# Patient Record
Sex: Male | Born: 1970
Health system: Southern US, Community
[De-identification: ages and names within clinical notes are randomized; demographics above are authoritative.]

## PROBLEM LIST (undated history)

## (undated) DIAGNOSIS — F32A Depression, unspecified: Secondary | ICD-10-CM

## (undated) DIAGNOSIS — M5431 Sciatica, right side: Secondary | ICD-10-CM

## (undated) DIAGNOSIS — F429 Obsessive-compulsive disorder, unspecified: Secondary | ICD-10-CM

## (undated) DIAGNOSIS — F419 Anxiety disorder, unspecified: Secondary | ICD-10-CM

## (undated) DIAGNOSIS — F319 Bipolar disorder, unspecified: Secondary | ICD-10-CM

## (undated) DIAGNOSIS — I10 Essential (primary) hypertension: Secondary | ICD-10-CM

## (undated) DIAGNOSIS — F909 Attention-deficit hyperactivity disorder, unspecified type: Secondary | ICD-10-CM

## (undated) DIAGNOSIS — F329 Major depressive disorder, single episode, unspecified: Secondary | ICD-10-CM

## (undated) DIAGNOSIS — F29 Unspecified psychosis not due to a substance or known physiological condition: Secondary | ICD-10-CM

## (undated) HISTORY — DX: Major depressive disorder, single episode, unspecified: F32.9

## (undated) HISTORY — DX: Obsessive-compulsive disorder, unspecified: F42.9

## (undated) HISTORY — DX: Attention-deficit hyperactivity disorder, unspecified type: F90.9

## (undated) HISTORY — DX: Depression, unspecified: F32.A

## (undated) HISTORY — DX: Essential (primary) hypertension: I10

## (undated) HISTORY — DX: Bipolar disorder, unspecified: F31.9

## (undated) HISTORY — DX: Sciatica, right side: M54.31

## (undated) HISTORY — PX: LEG SURGERY: SHX1003

## (undated) HISTORY — DX: Unspecified psychosis not due to a substance or known physiological condition: F29

## (undated) HISTORY — DX: Anxiety disorder, unspecified: F41.9

---

## 1996-10-13 HISTORY — PX: HAND TENDON SURGERY: SHX663

## 2012-09-20 ENCOUNTER — Telehealth (HOSPITAL_COMMUNITY): Payer: Self-pay | Admitting: Psychiatry

## 2012-09-20 ENCOUNTER — Ambulatory Visit (INDEPENDENT_AMBULATORY_CARE_PROVIDER_SITE_OTHER): Payer: 59 | Admitting: Psychiatry

## 2012-09-20 ENCOUNTER — Encounter (HOSPITAL_COMMUNITY): Payer: Self-pay | Admitting: Psychiatry

## 2012-09-20 VITALS — BP 140/82 | HR 84 | Ht 70.5 in | Wt 216.4 lb

## 2012-09-20 DIAGNOSIS — F5105 Insomnia due to other mental disorder: Secondary | ICD-10-CM | POA: Insufficient documentation

## 2012-09-20 DIAGNOSIS — F410 Panic disorder [episodic paroxysmal anxiety] without agoraphobia: Secondary | ICD-10-CM

## 2012-09-20 DIAGNOSIS — F988 Other specified behavioral and emotional disorders with onset usually occurring in childhood and adolescence: Secondary | ICD-10-CM

## 2012-09-20 DIAGNOSIS — F411 Generalized anxiety disorder: Secondary | ICD-10-CM

## 2012-09-20 DIAGNOSIS — F902 Attention-deficit hyperactivity disorder, combined type: Secondary | ICD-10-CM

## 2012-09-20 DIAGNOSIS — F952 Tourette's disorder: Secondary | ICD-10-CM

## 2012-09-20 DIAGNOSIS — F429 Obsessive-compulsive disorder, unspecified: Secondary | ICD-10-CM

## 2012-09-20 MED ORDER — CLONIDINE HCL ER 0.1 & 0.2 MG PO MISC: 0.1000 mg | Freq: Every day | ORAL | Status: DC

## 2012-09-20 MED ORDER — VENLAFAXINE HCL ER 37.5 MG PO CP24: 37.5000 mg | ORAL_CAPSULE | Freq: Every day | ORAL | Status: DC

## 2012-09-20 MED ORDER — CLONIDINE HCL 0.1 MG PO TABS: 0.1000 mg | ORAL_TABLET | Freq: Every day | ORAL | Status: DC

## 2012-09-20 NOTE — Progress Notes (Signed)
Psychiatric Assessment Adult  Patient Identification:  Todd Gilbert Date of Evaluation:  09/20/2012 Chief Complaint: I need help History of Chief Complaint: Pt first sought help at age 41 and has been in treatment pretty much ever since then.   He has been identified as LD and never graduated from HS.  He kept getting into trouble and getting kicked out of schools.  He has struggled with anxiety and depression and tried on several medications listed below.  He found the best benefit from Adderall.  He comes seeking other meds that don't cost $130 a month.  Since starting Adderall he has not been in trouble with the law.   Chief Complaint  Patient presents with  . ADD  . Depression  . Establish Care  . Medication Refill    HPI see above  Review of Systems Neuro: no headaches, ataxia, weakness GI: no N/V/D/cramps/constipation MS: no weakness, muscle cramps, aches. He notes some joint aches and could use chrondrotin sulfate with glucosamine.  Physical Exam Essentially WNL per his family doctor last week.  Depressive Symptoms: depressed mood, insomnia, difficulty concentrating, suicidal thoughts without plan, anxiety, panic attacks,  (Hypo) Manic Symptoms:   Elevated Mood:  Negative Irritable Mood:  Yes Grandiosity:  history of Distractibility:  Yes Labiality of Mood:  Yes Delusions:  Yes Hallucinations:  Yes Impulsivity:  Yes Sexually Inappropriate Behavior:  No Financial Extravagance:  history of Flight of Ideas:  No  Anxiety Symptoms: Excessive Worry:  Yes Panic Symptoms:  Yes Agoraphobia:  Yes Obsessive Compulsive: Yes  Symptoms: having things in perfect order and exactly right Specific Phobias:  No Social Anxiety:  Yes  Psychotic Symptoms:  Hallucinations: Yes Visual Delusions:  No Paranoia:  Yes   Ideas of Reference:  No  PTSD Symptoms: Ever had a traumatic exposure:  Yes Had a traumatic exposure in the last month:  No Re-experiencing: Yes  Flashbacks Hypervigilance:  No Hyperarousal: No Difficulty Concentrating Avoidance: Yes Decreased Interest/Participation  Traumatic Brain Injury: Yes Blunt Trauma  Past Psychiatric History: Diagnosis: ADHD, Bipolar disorder  Hospitalizations: adult twice  Outpatient Care: since age 38  Substance Abuse Care: adult twice  Self-Mutilation: none  Suicidal Attempts: none  Violent Behaviors: in the past   Past Medical History:   Past Medical History  Diagnosis Date  . ADHD (attention deficit hyperactivity disorder)   . Anxiety   . Bipolar disorder   . Depression   . Obsessive-compulsive disorder   . Psychosis    History of Loss of Consciousness:  Yes Seizure History:  No Cardiac History:  No Allergies:  Not on File Current Medications:  Current Outpatient Prescriptions  Medication Sig Dispense Refill  . amphetamine-dextroamphetamine (ADDERALL) 20 MG tablet Take 20 mg by mouth 3 (three) times daily.      . diazepam (VALIUM) 5 MG tablet Take 5 mg by mouth at bedtime as needed.        Previous Psychotropic Medications:  Medication Dose   Paxil   Vistaril   Zoloft   Lamictal   Depakote   Wellbutrin   Klonopin   Adderall   Valium   Ritalin   Remeron       Substance Abuse History in the last 12 months: Substance Age of 1st Use Last Use Amount Specific Type  Nicotine  13  2 weeks  dip    Alcohol  14  yesterday  two 12 oz  beer  Cannabis  14  years      Opiates  none  Cocaine  18  31      Methamphetamines  none        LSD  17  18      Ecstasy  none         Benzodiazepines  30's  last night  5mg   Valium  Caffeine  childhood  today  swallow  soft drink  Inhalants  none        Others:      Sugar  childhood  today                  Medical Consequences of Substance Abuse: none  Legal Consequences of Substance Abuse: none  Family Consequences of Substance Abuse: none  Blackouts:  No DT's:  No Withdrawal Symptoms:  No   Social History: Current Place  of Residence: [address] [place] [address] Place of Birth: [place] Family Members: wife step son Marital Status:  Married Children: 3  Sons: 2  Daughters: 1 Relationships: wife Education:  dropped ou in 11 th grade Educational Problems/Performance: LD and ADHD Religious Beliefs/Practices: none History of Abuse: none Occupational Experiences; none Military History:  Writer History: none Hobbies/Interests: fishing, watching sports, walking outside  Family History:   Family History  Problem Relation Age of Onset  . Alcohol abuse Father   . Anxiety disorder Father   . Dementia Maternal Grandmother   . OCD Neg Hx     Mental Status Examination/Evaluation: Objective:  Appearance: Casual  Eye Contact::  Good  Speech:  Clear and Coherent  Volume:  Normal  Mood:  Anxious and slightly depressed and distracted  Affect:  Congruent  Thought Process:  Coherent, Intact and Logical  Orientation:  Full (Time, Place, and Person)  Thought Content:  Hallucinations: Visual  Suicidal Thoughts:  No  Homicidal Thoughts:  No  Judgement:  Fair  Insight:  Fair  Psychomotor Activity:  Increased and Restlessness  Akathisia:  No  Handed:  Right  AIMS (if indicated):    Assets:  Communication Skills Desire for Improvement    Laboratory/X-Ray Psychological Evaluation(s)     Continuous performance testing   Assessment:    AXIS I ADHD, inattentive type, Generalized Anxiety Disorder, Obsessive Compulsive Disorder, Panic Disorder and Tourette Syndrome  AXIS II Deferred  AXIS III Past Medical History  Diagnosis Date  . ADHD (attention deficit hyperactivity disorder)   . Anxiety   . Bipolar disorder   . Depression   . Obsessive-compulsive disorder   . Psychosis      AXIS IV other psychosocial or environmental problems  AXIS V 61-70 mild symptoms   Treatment Plan/Recommendations:  Plan of Care: CPT adjusted medications trials  Laboratory:    Psychotherapy: problem  solving  Medications: Effexor, Kapvay, and Clonidine  Routine PRN Medications:  Negative  Consultations:   Safety Concerns:  Very slight with his history of violence, but much improved  Other:     Orson Aloe, MD 12/9/20139:39 AM

## 2012-09-20 NOTE — Patient Instructions (Addendum)
Could use "Move Free" or "Osteo bi Flex" for arthritic pain.   The important ingredients are Chondrotin Sulfate and Glucosamine.  Look up Tourette Syndrome on the Internet to see if that applies to you.

## 2012-11-01 ENCOUNTER — Ambulatory Visit (INDEPENDENT_AMBULATORY_CARE_PROVIDER_SITE_OTHER): Payer: 59 | Admitting: Psychiatry

## 2012-11-01 ENCOUNTER — Encounter (HOSPITAL_COMMUNITY): Payer: Self-pay | Admitting: Psychiatry

## 2012-11-01 ENCOUNTER — Telehealth (HOSPITAL_COMMUNITY): Payer: Self-pay | Admitting: Psychiatry

## 2012-11-01 VITALS — Wt 219.0 lb

## 2012-11-01 DIAGNOSIS — F410 Panic disorder [episodic paroxysmal anxiety] without agoraphobia: Secondary | ICD-10-CM

## 2012-11-01 DIAGNOSIS — F429 Obsessive-compulsive disorder, unspecified: Secondary | ICD-10-CM

## 2012-11-01 DIAGNOSIS — F419 Anxiety disorder, unspecified: Secondary | ICD-10-CM | POA: Insufficient documentation

## 2012-11-01 DIAGNOSIS — F411 Generalized anxiety disorder: Secondary | ICD-10-CM

## 2012-11-01 DIAGNOSIS — F952 Tourette's disorder: Secondary | ICD-10-CM

## 2012-11-01 DIAGNOSIS — F988 Other specified behavioral and emotional disorders with onset usually occurring in childhood and adolescence: Secondary | ICD-10-CM

## 2012-11-01 DIAGNOSIS — F902 Attention-deficit hyperactivity disorder, combined type: Secondary | ICD-10-CM

## 2012-11-01 DIAGNOSIS — F5105 Insomnia due to other mental disorder: Secondary | ICD-10-CM

## 2012-11-01 MED ORDER — CARBAMAZEPINE ER 200 MG PO TB12: 200.0000 mg | ORAL_TABLET | Freq: Every day | ORAL | Status: DC

## 2012-11-01 MED ORDER — AMPHETAMINE-DEXTROAMPHETAMINE 20 MG PO TABS: 20.0000 mg | ORAL_TABLET | Freq: Every day | ORAL | Status: DC

## 2012-11-01 MED ORDER — AMPHETAMINE-DEXTROAMPHETAMINE 20 MG PO TABS: 20.0000 mg | ORAL_TABLET | Freq: Three times a day (TID) | ORAL | Status: DC

## 2012-11-01 NOTE — Progress Notes (Signed)
Psychiatric Assessment Adult [facility] [address] Progress Note Cervando Durnin MRN: [id number] DOB: [birth year] Age: 42 y.o.  Date: 11/01/2012 Start Time: 9:55 AM End Time: 10:30 AM  Chief Complaint: Chief Complaint  Patient presents with  . ADHD  . Follow-up  . Medication Refill   History of Chief Complaint: Pt first sought help at age 72 and has been in treatment pretty much ever since then.   He has been identified as LD and never graduated from HS.  He kept getting into trouble and getting kicked out of schools.  He has struggled with anxiety and depression and tried on several medications listed below.  He found the best benefit from Adderall.  He comes seeking other meds that don't cost $130 a month.  Since starting Adderall he has not been in trouble with the law.  Depression 7/10 and Anxiety 7/10, where 1 is the best and 10 is the worst.  Pt reports that he is cnonompliant with the psychotropic medications because there was no benefit what so ever.  He was a zombie and wouldn't want to talk or go to work.  When he was on the Adderall, he was able to go to work and function normally.  He has not been in jail for the 4 years he was on it.  He is cleared of any debt to society and has paid everything off.   HPI see above  Review of Systems Neuro: no headaches, ataxia, weakness GI: no N/V/D/cramps/constipation MS: no weakness, muscle cramps, aches. He notes some joint aches and could use chrondrotin sulfate with glucosamine.  Physical Exam Essentially WNL per his family doctor last week.  Depressive Symptoms: depressed mood, insomnia, difficulty concentrating, suicidal thoughts without plan, anxiety, panic attacks,  (Hypo) Manic Symptoms:   Elevated Mood:  Negative Irritable Mood:  Yes Grandiosity:  history of Distractibility:  Yes Labiality of Mood:  Yes Delusions:  Yes Hallucinations:  Yes Impulsivity:  Yes Sexually Inappropriate Behavior:  No Financial  Extravagance:  history of Flight of Ideas:  No  Anxiety Symptoms: Excessive Worry:  Yes Panic Symptoms:  Yes Agoraphobia:  Yes Obsessive Compulsive: Yes  Symptoms: having things in perfect order and exactly right Specific Phobias:  No Social Anxiety:  Yes  Psychotic Symptoms:  Hallucinations: Yes Visual Delusions:  No Paranoia:  Yes   Ideas of Reference:  No  PTSD Symptoms: Ever had a traumatic exposure:  Yes Had a traumatic exposure in the last month:  No Re-experiencing: Yes Flashbacks Hypervigilance:  No Hyperarousal: No Difficulty Concentrating Avoidance: Yes Decreased Interest/Participation  Traumatic Brain Injury: Yes Blunt Trauma  Past Psychiatric History: Diagnosis: ADHD, Bipolar disorder  Hospitalizations: adult twice  Outpatient Care: since age 79  Substance Abuse Care: adult twice  Self-Mutilation: none  Suicidal Attempts: none  Violent Behaviors: in the past   Past Medical History:   Past Medical History  Diagnosis Date  . ADHD (attention deficit hyperactivity disorder)   . Anxiety   . Bipolar disorder   . Depression   . Obsessive-compulsive disorder   . Psychosis    History of Loss of Consciousness:  Yes Seizure History:  No Cardiac History:  No Allergies:  Not on File Current Medications:  Current Outpatient Prescriptions  Medication Sig Dispense Refill  . amphetamine-dextroamphetamine (ADDERALL) 20 MG tablet Take 20 mg by mouth 3 (three) times daily.      . cloNIDine (CATAPRES) 0.1 MG tablet Take 1 tablet (0.1 mg total) by mouth at bedtime. May take up  to 3 for sleep, may cause you to wake up after 4 hours.  60 tablet  1  . CloNIDine HCl ER (KAPVAY) 0.1 & 0.2 MG MISC Take 0.1 mg by mouth daily. BEFORE WORK FOR HYPERNESS  60 each  1  . diazepam (VALIUM) 5 MG tablet Take 5 mg by mouth at bedtime as needed.      . venlafaxine XR (EFFEXOR XR) 37.5 MG 24 hr capsule Take 1 capsule (37.5 mg total) by mouth daily.  30 capsule  2    Previous  Psychotropic Medications:  Medication Dose   Paxil   Vistaril   Zoloft   Lamictal   Depakote   Wellbutrin   Klonopin   Adderall   Valium   Ritalin   Remeron   Clonidine/Kapvay   Effexor    Substance Abuse History in the last 12 months: Substance Age of 1st Use Last Use Amount Specific Type  Nicotine  13  2 weeks  dip    Alcohol  14  yesterday  two 12 oz  beer  Cannabis  14  years      Opiates  none        Cocaine  [age]      Methamphetamines  none        LSD  17  18      Ecstasy  none         Benzodiazepines  30's  last night  5mg   Valium  Caffeine  childhood  today  swallow  soft drink  Inhalants  none        Others:      Sugar  childhood  today                  Medical Consequences of Substance Abuse: none  Legal Consequences of Substance Abuse: none  Family Consequences of Substance Abuse: none  Blackouts:  No DT's:  No Withdrawal Symptoms:  No   Social History: Current Place of Residence: [address] [place] [address] Place of Birth: [place] Family Members: wife step son Marital Status:  Married Children: 3  Sons: 2  Daughters: 1 Relationships: wife Education:  dropped out in 11 th grade Educational Problems/Performance: LD and ADHD Religious Beliefs/Practices: none History of Abuse: none Occupational Experiences; none Military History:  Writer History: none Hobbies/Interests: fishing, watching sports, walking outside  Family History:   Family History  Problem Relation Age of Onset  . Alcohol abuse Father   . Anxiety disorder Father   . Dementia Maternal Grandmother   . OCD Neg Hx   . ADD / ADHD Neg Hx   . Bipolar disorder Neg Hx   . Depression Neg Hx   . Drug abuse Neg Hx   . Paranoid behavior Neg Hx   . Schizophrenia Neg Hx   . Seizures Neg Hx   . Sexual abuse Neg Hx   . Physical abuse Neg Hx     Mental Status Examination/Evaluation: Objective:  Appearance: Casual  Eye Contact::  Good  Speech:  Clear and  Coherent  Volume:  Normal  Mood:  Anxious and slightly depressed and distracted  Affect:  Congruent  Thought Process:  Coherent, Intact and Logical  Orientation:  Full (Time, Place, and Person)  Thought Content:  Hallucinations: Visual  Suicidal Thoughts:  No  Homicidal Thoughts:  No  Judgement:  Fair  Insight:  Fair  Psychomotor Activity:  Increased and Restlessness  Akathisia:  No  Handed:  Right  AIMS (if indicated):    Assets:  Communication Skills Desire for Improvement    Laboratory/X-Ray Psychological Evaluation(s)     Continuous performance testing   Assessment:    AXIS I ADHD, inattentive type, Generalized Anxiety Disorder, Obsessive Compulsive Disorder, Panic Disorder and Tourette Syndrome  AXIS II Deferred  AXIS III Past Medical History  Diagnosis Date  . ADHD (attention deficit hyperactivity disorder)   . Anxiety   . Bipolar disorder   . Depression   . Obsessive-compulsive disorder   . Psychosis      AXIS IV other psychosocial or environmental problems  AXIS V 61-70 mild symptoms   Plan of Care: CPT adjusted medications trials  Laboratory:    Psychotherapy: problem solving  Medications: Effexor, Kapvay, and Clonidine  Routine PRN Medications:  Negative  Consultations:   Safety Concerns:  Very slight with his history of violence, but much improved  Other:    Plan/Discussion/Summary: I took his vitals.  I reviewed CC, tobacco/med/surg Hx, meds effects/ side effects, problem list, therapies and responses as well as current situation/symptoms discussed options. See orders and pt instructions for more details.  Orson Aloe, MD, [facility]

## 2012-11-01 NOTE — Patient Instructions (Addendum)
Call me back with the preauth information.  Try taking the Tegretol at one just after work and two at bed time.   Get blood level in about 2 weeks.  Tegretol speeds up the metabolism of a lot of things including itself.  In about a month the blood level of the Tegretol will be at about 60% of where it was when you started, so the dose will probably have to be increased.  Call if problems or concerns.

## 2012-11-01 NOTE — Telephone Encounter (Signed)
Old script filled by another doc was filled on Jan 3rd.  Will have the script I  Wrote for renewal on 20 th of Feb set to be filled on 1st Feb. Also asked pharmacist to mark that his script needs to be written by only one provider.

## 2012-11-03 NOTE — Telephone Encounter (Signed)
Phone message completed in the phone message section.  

## 2012-12-06 ENCOUNTER — Encounter (HOSPITAL_COMMUNITY): Payer: Self-pay | Admitting: Psychiatry

## 2012-12-06 ENCOUNTER — Ambulatory Visit (INDEPENDENT_AMBULATORY_CARE_PROVIDER_SITE_OTHER): Payer: 59 | Admitting: Psychiatry

## 2012-12-06 VITALS — Wt 220.6 lb

## 2012-12-06 DIAGNOSIS — F319 Bipolar disorder, unspecified: Secondary | ICD-10-CM | POA: Insufficient documentation

## 2012-12-06 DIAGNOSIS — F429 Obsessive-compulsive disorder, unspecified: Secondary | ICD-10-CM

## 2012-12-06 DIAGNOSIS — F902 Attention-deficit hyperactivity disorder, combined type: Secondary | ICD-10-CM

## 2012-12-06 DIAGNOSIS — F410 Panic disorder [episodic paroxysmal anxiety] without agoraphobia: Secondary | ICD-10-CM

## 2012-12-06 DIAGNOSIS — F952 Tourette's disorder: Secondary | ICD-10-CM

## 2012-12-06 DIAGNOSIS — F988 Other specified behavioral and emotional disorders with onset usually occurring in childhood and adolescence: Secondary | ICD-10-CM

## 2012-12-06 DIAGNOSIS — F411 Generalized anxiety disorder: Secondary | ICD-10-CM

## 2012-12-06 DIAGNOSIS — F5105 Insomnia due to other mental disorder: Secondary | ICD-10-CM

## 2012-12-06 DIAGNOSIS — F419 Anxiety disorder, unspecified: Secondary | ICD-10-CM

## 2012-12-06 MED ORDER — AMPHETAMINE-DEXTROAMPHETAMINE 20 MG PO TABS: 20.0000 mg | ORAL_TABLET | Freq: Three times a day (TID) | ORAL | Status: DC

## 2012-12-06 MED ORDER — AMPHETAMINE-DEXTROAMPHETAMINE 20 MG PO TABS: 20.0000 mg | ORAL_TABLET | Freq: Every day | ORAL | Status: DC

## 2012-12-06 MED ORDER — LITHIUM CARBONATE 300 MG PO TABS: 300.0000 mg | ORAL_TABLET | Freq: Every day | ORAL | Status: DC

## 2012-12-06 MED ORDER — GABAPENTIN 100 MG PO CAPS: 100.0000 mg | ORAL_CAPSULE | Freq: Four times a day (QID) | ORAL | Status: DC

## 2012-12-06 NOTE — Patient Instructions (Addendum)
Could use "Move Free" or "Osteo bi Flex" for arthritic pain.   The important ingredients are Chondrotin Sulfate and Glucosamine.  Tumeric is also helpful for arthritis.   Krill oil and cod liver oil may be helpful for arthritis.   [employer] is a great source for all of these.  [phone]  Try the Neurontin for anxiety during the day and more at night to help with getting to sleep.  May eventually take up to 5 or 6 if needed to get to sleep.  Lithium depends on how much salt you eat, so it may require a pretty high dose to dampen the mood swings.  We will need some blood tests to get the dose right.  Call if problems or concerns.  Get Lithium level about a week before the next appointment.

## 2012-12-06 NOTE — Addendum Note (Signed)
Addended by: Mike Craze on: 12/06/2012 09:12 AM   Modules accepted: Orders

## 2012-12-06 NOTE — Progress Notes (Signed)
Psychiatric Assessment Adult [facility] [address] Progress Note Delante Karapetyan MRN: [id number] DOB: [birth year] Age: 42 y.o.  Date: 12/06/2012 Start Time: 8:40 AM End Time:9:15 AM  Chief Complaint: Chief Complaint  Patient presents with  . ADHD  . Follow-up  . Medication Refill   History of Chief Complaint: Pt first sought help at age 86 and has been in treatment pretty much ever since then.   He has been identified as LD and never graduated from HS.  He kept getting into trouble and getting kicked out of schools.  He has struggled with anxiety and depression and tried on several medications listed below.  He found the best benefit from Adderall.  He comes seeking other meds that don't cost $130 a month.  Since starting Adderall he has not been in trouble with the law.  Depression 2/10 and Anxiety 2/10, where 1 is the best and 10 is the worst.  Pain issues today 2 or 3/10. Pt reports that he is nonompliant with the Tegretol because there was no benefit and noted considerable nightmares. The Adderall helps him function fully at work.  There are no problems with anxiety or impulsivity.  Discussed his anxiety and pain issues.  Reviewed what Inderal could do for him and what Neurontin could do.  He was interested in trying Neurontin.  Also mentioned Vyvanse and he was not interested in changing the Adderall.  Discussed him stopping the Tegretol, but adding Lithium for his mood dyscontrol which sounds to be impacting his relationships and performance.   HPI see above  Review of Systems Neuro: no headaches, ataxia, weakness GI: no N/V/D/cramps/constipation MS: no weakness, muscle cramps, aches. He notes some joint aches and could use chrondrotin sulfate with glucosamine.  Physical Exam Essentially WNL per his family doctor recently  Depressive Symptoms: depressed mood, insomnia, difficulty concentrating, suicidal thoughts without plan, anxiety, panic attacks,  (Hypo) Manic  Symptoms:   Elevated Mood:  Negative Irritable Mood:  Yes Grandiosity:  history of Distractibility:  Yes Labiality of Mood:  Yes Delusions:  Yes Hallucinations:  Yes Impulsivity:  Yes Sexually Inappropriate Behavior:  No Financial Extravagance:  history of Flight of Ideas:  No  Anxiety Symptoms: Excessive Worry:  Yes Panic Symptoms:  Yes Agoraphobia:  Yes Obsessive Compulsive: Yes  Symptoms: having things in perfect order and exactly right Specific Phobias:  No Social Anxiety:  Yes  Psychotic Symptoms:  Hallucinations: Yes Visual Delusions:  No Paranoia:  Yes   Ideas of Reference:  No  PTSD Symptoms: Ever had a traumatic exposure:  Yes Had a traumatic exposure in the last month:  No Re-experiencing: Yes Flashbacks Hypervigilance:  No Hyperarousal: No Difficulty Concentrating Avoidance: Yes Decreased Interest/Participation  Traumatic Brain Injury: Yes Blunt Trauma  Past Psychiatric History: Diagnosis: ADHD, Bipolar disorder  Hospitalizations: adult twice  Outpatient Care: since age 57  Substance Abuse Care: adult twice  Self-Mutilation: none  Suicidal Attempts: none  Violent Behaviors: in the past   Past Medical History:   Past Medical History  Diagnosis Date  . ADHD (attention deficit hyperactivity disorder)   . Anxiety   . Bipolar disorder   . Depression   . Obsessive-compulsive disorder   . Psychosis    History of Loss of Consciousness:  Yes Seizure History:  No Cardiac History:  No Allergies:  No Known Allergies Current Medications:  Current Outpatient Prescriptions  Medication Sig Dispense Refill  . amphetamine-dextroamphetamine (ADDERALL) 20 MG tablet Take 1 tablet (20 mg total) by mouth 3 (three)  times daily.  90 tablet  0  . amphetamine-dextroamphetamine (ADDERALL) 20 MG tablet Take 1 tablet (20 mg total) by mouth daily.  30 tablet  0  . carbamazepine (TEGRETOL-XR) 200 MG 12 hr tablet Take 1-3 tablets (200-600 mg total) by mouth at bedtime.  90  tablet  2   No current facility-administered medications for this visit.    Previous Psychotropic Medications:  Medication Dose   Paxil   Vistaril   Zoloft   Lamictal   Depakote   Wellbutrin   Klonopin   Adderall   Valium   Ritalin   Remeron   Clonidine/Kapvay   Effexor    Substance Abuse History in the last 12 months: Substance Age of 1st Use Last Use Amount Specific Type  Nicotine  13  2 weeks  dip    Alcohol  14  yesterday  two 12 oz  beer  Cannabis  14  years      Opiates  none        Cocaine  18  31      Methamphetamines  none        LSD  17  18      Ecstasy  none         Benzodiazepines  30's  last night  5mg   Valium  Caffeine  childhood  today  swallow  soft drink  Inhalants  none        Others:      Sugar  childhood  today                  Medical Consequences of Substance Abuse: none  Legal Consequences of Substance Abuse: none  Family Consequences of Substance Abuse: none  Blackouts:  No DT's:  No Withdrawal Symptoms:  No   Social History: Current Place of Residence: [address] [place] [address] Place of Birth: [place] Family Members: wife step son Marital Status:  Married Children: 3  Sons: 2  Daughters: 1 Relationships: wife Education:  dropped out in 11 th grade Educational Problems/Performance: LD and ADHD Religious Beliefs/Practices: none History of Abuse: none Occupational Experiences; none Military History:  Writer History: none Hobbies/Interests: fishing, watching sports, walking outside  Family History:   Family History  Problem Relation Age of Onset  . Alcohol abuse Father   . Anxiety disorder Father   . Dementia Maternal Grandmother   . OCD Neg Hx   . ADD / ADHD Neg Hx   . Bipolar disorder Neg Hx   . Depression Neg Hx   . Drug abuse Neg Hx   . Paranoid behavior Neg Hx   . Schizophrenia Neg Hx   . Seizures Neg Hx   . Sexual abuse Neg Hx   . Physical abuse Neg Hx     Mental Status  Examination/Evaluation: Objective:  Appearance: Casual  Eye Contact::  Good  Speech:  Clear and Coherent  Volume:  Normal  Mood:  Anxious and slightly depressed and distracted  Affect:  Congruent  Thought Process:  Coherent, Intact and Logical  Orientation:  Full (Time, Place, and Person)  Thought Content:  Hallucinations: Visual  Suicidal Thoughts:  No  Homicidal Thoughts:  No  Judgement:  Fair  Insight:  Fair  Psychomotor Activity:  Increased and Restlessness  Akathisia:  No  Handed:  Right  AIMS (if indicated):    Assets:  Communication Skills Desire for Improvement    Laboratory/X-Ray Psychological Evaluation(s)     Continuous performance  testing   Assessment:    AXIS I ADHD, inattentive type, Generalized Anxiety Disorder, Obsessive Compulsive Disorder, Panic Disorder and Tourette Syndrome  AXIS II Deferred  AXIS III Past Medical History  Diagnosis Date  . ADHD (attention deficit hyperactivity disorder)   . Anxiety   . Bipolar disorder   . Depression   . Obsessive-compulsive disorder   . Psychosis      AXIS IV other psychosocial or environmental problems  AXIS V 61-70 mild symptoms   Plan of Care: CPT adjusted medications trials  Laboratory:    Psychotherapy: problem solving  Medications: Effexor, Kapvay, and Clonidine  Routine PRN Medications:  Negative  Consultations:   Safety Concerns:  Very slight with his history of violence, but much improved  Other:     Plan/Discussion/Summary: I took his vitals.  I reviewed CC, tobacco/med/surg Hx, meds effects/ side effects, problem list, therapies and responses as well as current situation/symptoms discussed options. See orders and pt instructions for more details.  Orson Aloe, MD, [facility]

## 2012-12-27 ENCOUNTER — Ambulatory Visit (HOSPITAL_COMMUNITY): Payer: Self-pay | Admitting: Psychiatry

## 2013-01-03 ENCOUNTER — Encounter (HOSPITAL_COMMUNITY): Payer: Self-pay | Admitting: Psychiatry

## 2013-01-03 ENCOUNTER — Ambulatory Visit (INDEPENDENT_AMBULATORY_CARE_PROVIDER_SITE_OTHER): Payer: 59 | Admitting: Psychiatry

## 2013-01-03 VITALS — Wt 219.2 lb

## 2013-01-03 DIAGNOSIS — F419 Anxiety disorder, unspecified: Secondary | ICD-10-CM

## 2013-01-03 DIAGNOSIS — F410 Panic disorder [episodic paroxysmal anxiety] without agoraphobia: Secondary | ICD-10-CM

## 2013-01-03 DIAGNOSIS — F5105 Insomnia due to other mental disorder: Secondary | ICD-10-CM

## 2013-01-03 DIAGNOSIS — G894 Chronic pain syndrome: Secondary | ICD-10-CM | POA: Insufficient documentation

## 2013-01-03 DIAGNOSIS — F902 Attention-deficit hyperactivity disorder, combined type: Secondary | ICD-10-CM

## 2013-01-03 DIAGNOSIS — F429 Obsessive-compulsive disorder, unspecified: Secondary | ICD-10-CM

## 2013-01-03 DIAGNOSIS — F988 Other specified behavioral and emotional disorders with onset usually occurring in childhood and adolescence: Secondary | ICD-10-CM

## 2013-01-03 DIAGNOSIS — F319 Bipolar disorder, unspecified: Secondary | ICD-10-CM

## 2013-01-03 DIAGNOSIS — F411 Generalized anxiety disorder: Secondary | ICD-10-CM

## 2013-01-03 DIAGNOSIS — F952 Tourette's disorder: Secondary | ICD-10-CM

## 2013-01-03 MED ORDER — DULOXETINE HCL 20 MG PO CPEP: 20.0000 mg | ORAL_CAPSULE | Freq: Two times a day (BID) | ORAL | Status: DC

## 2013-01-03 MED ORDER — METHOCARBAMOL 500 MG PO TABS: 500.0000 mg | ORAL_TABLET | Freq: Four times a day (QID) | ORAL | Status: DC

## 2013-01-03 MED ORDER — GABAPENTIN 100 MG PO CAPS: 100.0000 mg | ORAL_CAPSULE | Freq: Four times a day (QID) | ORAL | Status: DC

## 2013-01-03 MED ORDER — AMPHETAMINE-DEXTROAMPHETAMINE 20 MG PO TABS: 20.0000 mg | ORAL_TABLET | Freq: Three times a day (TID) | ORAL | Status: DC

## 2013-01-03 NOTE — Progress Notes (Signed)
[facility] [address] Progress Note Todd Gilbert MRN: [id number] DOB: 12-26-1970 Age: 42 y.o.  Date: 01/03/2013 Start Time: 8:35 AM End Time: 9:05 AM  Chief Complaint: Chief Complaint  Patient presents with  . ADHD  . Follow-up  . Medication Refill   Subjective: "I'm not taking the Neurontin and the Lithium makes me do bizarre things at night.  I'm not sleeping now.  I'm really tired". Depression 0/10 and Anxiety 7/10, where 1 is the best and 10 is the worst.  Pain issues today 6 or 7/10 along the sciatic nerve and lower back. Pt comes back with his partner for follow appointment.  Pt reports that he is compliant with the psychotropic medications with fair benefit and major side effects.  Really weird dreams have entered his life again with taking the Lithium.  Will stop that.  He tosses and turns and has great difficulty getting to sleep.  He asks for the Valium.  Apparently his back was being relaxed very nicely with the Valium.  Discussed several options for managing his pain.  He is agreeable with trying some.   History of Chief Complaint: Pt first sought help at age 76 and has been in treatment pretty much ever since then.   He has been identified as LD and never graduated from HS.  He kept getting into trouble and getting kicked out of schools.  He has struggled with anxiety and depression and tried on several medications listed below.  He found the best benefit from Adderall.  He comes seeking other meds that don't cost $130 a month.  Since starting Adderall he has not been in trouble with the law.   HPI see above  Review of Systems Neuro: no headaches, ataxia, weakness GI: no N/V/D/cramps/constipation MS: no weakness, muscle cramps, aches. He notes some joint aches and could use chrondrotin sulfate with glucosamine.  Physical Exam Essentially WNL per his family doctor recently  Depressive Symptoms: depressed mood, insomnia, difficulty concentrating, suicidal  thoughts without plan, anxiety, panic attacks,  (Hypo) Manic Symptoms:   Elevated Mood:  Negative Irritable Mood:  Yes Grandiosity:  history of Distractibility:  Yes Labiality of Mood:  Yes Delusions:  Yes Hallucinations:  Yes Impulsivity:  Yes Sexually Inappropriate Behavior:  No Financial Extravagance:  history of Flight of Ideas:  No  Anxiety Symptoms: Excessive Worry:  Yes Panic Symptoms:  Yes Agoraphobia:  Yes Obsessive Compulsive: Yes  Symptoms: having things in perfect order and exactly right Specific Phobias:  No Social Anxiety:  Yes  Psychotic Symptoms:  Hallucinations: Yes Visual Delusions:  No Paranoia:  Yes   Ideas of Reference:  No  PTSD Symptoms: Ever had a traumatic exposure:  Yes Had a traumatic exposure in the last month:  No Re-experiencing: Yes Flashbacks Hypervigilance:  No Hyperarousal: No Difficulty Concentrating Avoidance: Yes Decreased Interest/Participation  Traumatic Brain Injury: Yes Blunt Trauma  Past Psychiatric History: Diagnosis: ADHD, Bipolar disorder  Hospitalizations: adult twice  Outpatient Care: since age 79  Substance Abuse Care: adult twice  Self-Mutilation: none  Suicidal Attempts: none  Violent Behaviors: in the past   Past Medical History:   Past Medical History  Diagnosis Date  . ADHD (attention deficit hyperactivity disorder)   . Anxiety   . Bipolar disorder   . Depression   . Obsessive-compulsive disorder   . Psychosis    History of Loss of Consciousness:  Yes Seizure History:  No Cardiac History:  No Allergies:   Allergies  Allergen Reactions  . Lithium  Other (See Comments)    Bizarre behavior at night, climbing out the window and bringing mail into the bedroom  . Tegretol (Carbamazepine) Other (See Comments)    Way out there VIVID dreams   Current Medications:  Current Outpatient Prescriptions  Medication Sig Dispense Refill  . amphetamine-dextroamphetamine (ADDERALL) 20 MG tablet Take 1 tablet (20  mg total) by mouth 3 (three) times daily.  90 tablet  0  . amphetamine-dextroamphetamine (ADDERALL) 20 MG tablet Take 1 tablet (20 mg total) by mouth daily.  30 tablet  0  . amphetamine-dextroamphetamine (ADDERALL) 20 MG tablet Take 1 tablet (20 mg total) by mouth 3 (three) times daily.  90 tablet  0  . gabapentin (NEURONTIN) 100 MG capsule Take 1-4 capsules (100-400 mg total) by mouth 4 (four) times daily.  150 capsule  1   No current facility-administered medications for this visit.    Previous Psychotropic Medications:  Medication Dose   Paxil   Vistaril   Zoloft   Lamictal   Depakote   Wellbutrin   Klonopin   Adderall   Valium   Ritalin   Remeron   Clonidine/Kapvay   Effexor    Substance Abuse History in the last 12 months: Substance Age of 1st Use Last Use Amount Specific Type  Nicotine  13  2 weeks  dip    Alcohol  14  yesterday  two 12 oz  beer  Cannabis  14  years      Opiates  none        Cocaine  [age]      Methamphetamines  none        LSD  17  18      Ecstasy  none         Benzodiazepines  30's  last night  5mg   Valium  Caffeine  childhood  today  swallow  soft drink  Inhalants  none        Others:      Sugar  childhood  today                  Medical Consequences of Substance Abuse: none  Legal Consequences of Substance Abuse: none  Family Consequences of Substance Abuse: none  Blackouts:  No DT's:  No Withdrawal Symptoms:  No   Social History: Current Place of Residence: [address] [place] [address] Place of Birth: [place] Family Members: wife step son Marital Status:  Married Children: 3  Sons: 2  Daughters: 1 Relationships: wife Education:  dropped out in 11 th grade Educational Problems/Performance: LD and ADHD Religious Beliefs/Practices: none History of Abuse: none Occupational Experiences; none Military History:  Writer History: none Hobbies/Interests: fishing, watching sports, walking outside  Family  History:   Family History  Problem Relation Age of Onset  . Alcohol abuse Father   . Anxiety disorder Father   . Dementia Maternal Grandmother   . OCD Neg Hx   . ADD / ADHD Neg Hx   . Bipolar disorder Neg Hx   . Depression Neg Hx   . Drug abuse Neg Hx   . Paranoid behavior Neg Hx   . Schizophrenia Neg Hx   . Seizures Neg Hx   . Sexual abuse Neg Hx   . Physical abuse Neg Hx     Mental Status Examination/Evaluation: Objective:  Appearance: Casual  Eye Contact::  Good  Speech:  Clear and Coherent  Volume:  Normal  Mood:  Anxious and  slightly depressed and distracted  Affect:  Congruent  Thought Process:  Coherent, Intact and Logical  Orientation:  Full (Time, Place, and Person)  Thought Content:  Hallucinations: Visual  Suicidal Thoughts:  No  Homicidal Thoughts:  No  Judgement:  Fair  Insight:  Fair  Psychomotor Activity:  Increased and Restlessness  Akathisia:  No  Handed:  Right  AIMS (if indicated):    Assets:  Communication Skills Desire for Improvement    Laboratory/X-Ray Psychological Evaluation(s)     Continuous performance testing   Assessment:    AXIS I ADHD, inattentive type, Generalized Anxiety Disorder, Obsessive Compulsive Disorder, Panic Disorder and Tourette Syndrome  AXIS II Deferred  AXIS III Past Medical History  Diagnosis Date  . ADHD (attention deficit hyperactivity disorder)   . Anxiety   . Bipolar disorder   . Depression   . Obsessive-compulsive disorder   . Psychosis      AXIS IV other psychosocial or environmental problems  AXIS V 61-70 mild symptoms   Plan of Care: CPT adjusted medications trials  Laboratory:    Psychotherapy: problem solving  Medications: Effexor, Kapvay, and Clonidine  Routine PRN Medications:  Negative  Consultations:   Safety Concerns:  Very slight with his history of violence, but much improved  Other:     Plan/Discussion: I took his vitals.  I reviewed CC, tobacco/med/surg Hx, meds effects/ side  effects, problem list, therapies and responses as well as current situation/symptoms discussed options. Stop Lithium and try better pain management for him.  Cont Adderall. See orders and pt instructions for more details.  Medical Decision Making Problem Points:  Established problem, stable/improving (1), New problem, with no additional work-up planned (3), Review of last therapy session (1) and Review of psycho-social stressors (1) Data Points:  Review of medication regiment & side effects (2) Review of new medications or change in dosage (2)  I certify that outpatient services furnished can reasonably be expected to improve the patient's condition.   Orson Aloe, MD, [facility]

## 2013-01-03 NOTE — Patient Instructions (Signed)
Push any and all of the new meds for relief of the back spasms so you can sleep at night.  Call if problems or concerns.

## 2013-02-03 ENCOUNTER — Encounter (HOSPITAL_COMMUNITY): Payer: Self-pay | Admitting: Psychiatry

## 2013-02-03 ENCOUNTER — Ambulatory Visit (INDEPENDENT_AMBULATORY_CARE_PROVIDER_SITE_OTHER): Payer: 59 | Admitting: Psychiatry

## 2013-02-03 VITALS — BP 138/88 | Ht 70.0 in | Wt 207.0 lb

## 2013-02-03 DIAGNOSIS — G894 Chronic pain syndrome: Secondary | ICD-10-CM

## 2013-02-03 DIAGNOSIS — F5105 Insomnia due to other mental disorder: Secondary | ICD-10-CM

## 2013-02-03 DIAGNOSIS — F952 Tourette's disorder: Secondary | ICD-10-CM

## 2013-02-03 DIAGNOSIS — F429 Obsessive-compulsive disorder, unspecified: Secondary | ICD-10-CM

## 2013-02-03 DIAGNOSIS — F411 Generalized anxiety disorder: Secondary | ICD-10-CM

## 2013-02-03 DIAGNOSIS — F410 Panic disorder [episodic paroxysmal anxiety] without agoraphobia: Secondary | ICD-10-CM

## 2013-02-03 DIAGNOSIS — F419 Anxiety disorder, unspecified: Secondary | ICD-10-CM

## 2013-02-03 DIAGNOSIS — F319 Bipolar disorder, unspecified: Secondary | ICD-10-CM

## 2013-02-03 DIAGNOSIS — F902 Attention-deficit hyperactivity disorder, combined type: Secondary | ICD-10-CM

## 2013-02-03 DIAGNOSIS — F988 Other specified behavioral and emotional disorders with onset usually occurring in childhood and adolescence: Secondary | ICD-10-CM

## 2013-02-03 MED ORDER — AMPHETAMINE-DEXTROAMPHETAMINE 20 MG PO TABS: 20.0000 mg | ORAL_TABLET | Freq: Three times a day (TID) | ORAL | Status: DC

## 2013-02-03 MED ORDER — DULOXETINE HCL 30 MG PO CPEP: 30.0000 mg | ORAL_CAPSULE | Freq: Two times a day (BID) | ORAL | Status: DC

## 2013-02-03 MED ORDER — METHOCARBAMOL 750 MG PO TABS: 750.0000 mg | ORAL_TABLET | Freq: Four times a day (QID) | ORAL | Status: DC

## 2013-02-03 MED ORDER — GABAPENTIN 300 MG PO CAPS: 300.0000 mg | ORAL_CAPSULE | Freq: Four times a day (QID) | ORAL | Status: DC

## 2013-02-03 NOTE — Progress Notes (Signed)
[facility] [address] Progress Note Todd Gilbert MRN: [id number] DOB: 10/16/1970 Age: 42 y.o.  Date: 02/03/2013 Start Time: 9:23 AM End Time: 9:45 AM  Chief Complaint: Chief Complaint  Patient presents with  . Depression  . Follow-up  . Medication Refill   Subjective: "I'm doing okay with the Adderall, but my anxiety and pain are not doing well". Depression 1/10 and Anxiety 8/10, where 1 is the best and 10 is the worst.  Pain issues today 9/10 along the sciatic nerve and lower back as well as rotator cuff of (L) shoulder. Pt comes back for follow appointment.  Pt reports that he is compliant with the psychotropic medications with good benefit and major side effects.  Pain management so far is inadequate, but the doses are not at the max yet.  Will push Cymbalta, Neurontin, and Robaxin.  Consider referral to pain management if this doesn't help.   History of Chief Complaint: Pt first sought help at age 59 and has been in treatment pretty much ever since then.   He has been identified as LD and never graduated from HS.  He kept getting into trouble and getting kicked out of schools.  He has struggled with anxiety and depression and tried on several medications listed below.  He found the best benefit from Adderall.  He comes seeking other meds that don't cost $130 a month.  Since starting Adderall he has not been in trouble with the law.   HPI see above  Review of Systems Neuro: no headaches, ataxia, weakness GI: no N/V/D/cramps/constipation MS: no weakness, muscle cramps, aches. He notes some joint aches and could use chrondrotin sulfate with glucosamine.  Physical Exam Essentially WNL per his family doctor recently  Depressive Symptoms: depressed mood, insomnia, difficulty concentrating, suicidal thoughts without plan, anxiety, panic attacks,  (Hypo) Manic Symptoms:   Elevated Mood:  Negative Irritable Mood:  Yes Grandiosity:  history of Distractibility:   Yes Labiality of Mood:  Yes Delusions:  Yes Hallucinations:  Yes Impulsivity:  Yes Sexually Inappropriate Behavior:  No Financial Extravagance:  history of Flight of Ideas:  No  Anxiety Symptoms: Excessive Worry:  Yes Panic Symptoms:  Yes Agoraphobia:  Yes Obsessive Compulsive: Yes  Symptoms: having things in perfect order and exactly right Specific Phobias:  No Social Anxiety:  Yes  Psychotic Symptoms:  Hallucinations: Yes Visual Delusions:  No Paranoia:  Yes   Ideas of Reference:  No  PTSD Symptoms: Ever had a traumatic exposure:  Yes Had a traumatic exposure in the last month:  No Re-experiencing: Yes Flashbacks Hypervigilance:  No Hyperarousal: No Difficulty Concentrating Avoidance: Yes Decreased Interest/Participation  Traumatic Brain Injury: Yes Blunt Trauma  Past Psychiatric History: Diagnosis: ADHD, Bipolar disorder  Hospitalizations: adult twice  Outpatient Care: since age 58  Substance Abuse Care: adult twice  Self-Mutilation: none  Suicidal Attempts: none  Violent Behaviors: in the past   Past Medical History:   Past Medical History  Diagnosis Date  . ADHD (attention deficit hyperactivity disorder)   . Anxiety   . Bipolar disorder   . Depression   . Obsessive-compulsive disorder   . Psychosis    History of Loss of Consciousness:  Yes Seizure History:  No Cardiac History:  No  Vitals: BP 138/88  Ht 5\' 10"  (1.778 m)  Wt 207 lb (93.895 kg)  BMI 29.7 kg/m2  Allergies:   Allergies  Allergen Reactions  . Lithium Other (See Comments)    Bizarre behavior at night, climbing out the window  and bringing mail into the bedroom  . Tegretol (Carbamazepine) Other (See Comments)    Way out there VIVID dreams   Current Medications:  Current Outpatient Prescriptions  Medication Sig Dispense Refill  . amphetamine-dextroamphetamine (ADDERALL) 20 MG tablet Take 1 tablet (20 mg total) by mouth 3 (three) times daily.  90 tablet  0  . DULoxetine (CYMBALTA)  20 MG capsule Take 1 capsule (20 mg total) by mouth 2 (two) times daily.  60 capsule  2  . gabapentin (NEURONTIN) 100 MG capsule Take 1-4 capsules (100-400 mg total) by mouth 4 (four) times daily.  150 capsule  1  . methocarbamol (ROBAXIN) 500 MG tablet Take 1 tablet (500 mg total) by mouth 4 (four) times daily.  60 tablet  1  . amphetamine-dextroamphetamine (ADDERALL) 20 MG tablet Take 1 tablet (20 mg total) by mouth daily.  30 tablet  0  . amphetamine-dextroamphetamine (ADDERALL) 20 MG tablet Take 1 tablet (20 mg total) by mouth 3 (three) times daily.  90 tablet  0   No current facility-administered medications for this visit.    Previous Psychotropic Medications:  Medication Dose   Paxil   Vistaril   Zoloft   Lamictal   Depakote   Wellbutrin   Klonopin   Adderall   Valium   Ritalin   Remeron   Clonidine/Kapvay   Effexor    Substance Abuse History in the last 12 months: Substance Age of 1st Use Last Use Amount Specific Type  Nicotine  13  2 weeks  dip    Alcohol  14  yesterday  two 12 oz  beer  Cannabis  14  years      Opiates  none        Cocaine  [age]      Methamphetamines  none        LSD  [age]      Ecstasy  none         Benzodiazepines  30's  last night  5mg   Valium  Caffeine  childhood  today  swallow  soft drink  Inhalants  none        Others:      Sugar  childhood  today                  Medical Consequences of Substance Abuse: none  Legal Consequences of Substance Abuse: none  Family Consequences of Substance Abuse: none  Blackouts:  No DT's:  No Withdrawal Symptoms:  No   Social History: Current Place of Residence: [address] [place] [address] Place of Birth: [place] Family Members: wife step son Marital Status:  Married Children: 3  Sons: 2  Daughters: 1 Relationships: wife Education:  dropped out in 11 th grade Educational Problems/Performance: LD and ADHD Religious Beliefs/Practices: none History of Abuse: none Occupational  Experiences; none Military History:  Writer History: none Hobbies/Interests: fishing, watching sports, walking outside  Family History:   Family History  Problem Relation Age of Onset  . Alcohol abuse Father   . Anxiety disorder Father   . Dementia Maternal Grandmother   . OCD Neg Hx   . ADD / ADHD Neg Hx   . Bipolar disorder Neg Hx   . Depression Neg Hx   . Drug abuse Neg Hx   . Paranoid behavior Neg Hx   . Schizophrenia Neg Hx   . Seizures Neg Hx   . Sexual abuse Neg Hx   . Physical  abuse Neg Hx     Mental Status Examination/Evaluation: Objective:  Appearance: Casual  Eye Contact::  Good  Speech:  Clear and Coherent  Volume:  Normal  Mood:  Anxious and slightly depressed and distracted  Affect:  Congruent  Thought Process:  Coherent, Intact and Logical  Orientation:  Full (Time, Place, and Person)  Thought Content:  Hallucinations: Visual  Suicidal Thoughts:  No  Homicidal Thoughts:  No  Judgement:  Fair  Insight:  Fair  Psychomotor Activity:  Increased and Restlessness  Akathisia:  No  Handed:  Right  AIMS (if indicated):    Assets:  Communication Skills Desire for Improvement   Lab Results: No results found for this or any previous visit (from the past 2016 hour(s)).  Assessment:    AXIS I ADHD, inattentive type, Generalized Anxiety Disorder, Obsessive Compulsive Disorder, Panic Disorder and Tourette Syndrome  AXIS II Deferred  AXIS III Past Medical History  Diagnosis Date  . ADHD (attention deficit hyperactivity disorder)   . Anxiety   . Bipolar disorder   . Depression   . Obsessive-compulsive disorder   . Psychosis      AXIS IV other psychosocial or environmental problems  AXIS V 61-70 mild symptoms   Plan of Care: CPT adjusted medications trials  Laboratory:    Psychotherapy: problem solving  Medications: Effexor, Kapvay, and Clonidine  Routine PRN Medications:  Negative  Consultations:   Safety Concerns:  Very slight with his  history of violence, but much improved  Other:     Plan/Discussion: I took his vitals.  I reviewed CC, tobacco/med/surg Hx, meds effects/ side effects, problem list, therapies and responses as well as current situation/symptoms discussed options. Continue Adderall increase all pain management meds. See orders and pt instructions for more details.  MEDICATIONS this encounter: Meds ordered this encounter  Medications  . methocarbamol (ROBAXIN) 750 MG tablet    Sig: Take 1 tablet (750 mg total) by mouth 4 (four) times daily.    Dispense:  120 tablet    Refill:  1  . gabapentin (NEURONTIN) 300 MG capsule    Sig: Take 1-2 capsules (300-600 mg total) by mouth 4 (four) times daily.    Dispense:  240 capsule    Refill:  1  . DULoxetine (CYMBALTA) 30 MG capsule    Sig: Take 1 capsule (30 mg total) by mouth 2 (two) times daily.    Dispense:  60 capsule    Refill:  2  . amphetamine-dextroamphetamine (ADDERALL) 20 MG tablet    Sig: Take 1 tablet (20 mg total) by mouth 3 (three) times daily.    Dispense:  90 tablet    Refill:  0  . amphetamine-dextroamphetamine (ADDERALL) 20 MG tablet    Sig: Take 1 tablet (20 mg total) by mouth 3 (three) times daily.    Dispense:  90 tablet    Refill:  0    Do not refill until 03/01/2013    Medical Decision Making Problem Points:  Established problem, stable/improving (1), Established problem, worsening (2), Review of last therapy session (1) and Review of psycho-social stressors (1) Data Points:  Review or order clinical lab tests (1) Review of medication regiment & side effects (2) Review of new medications or change in dosage (2)  I certify that outpatient services furnished can reasonably be expected to improve the patient's condition.   Orson Aloe, MD, [facility]

## 2013-02-03 NOTE — Patient Instructions (Signed)
Check with your Family Doctor about starting an anti inflammatory and maybe take over prescribing the Robaxin.  "I am Wishes Fulfilled Meditation" by Marylene Buerger and Lyndal Pulley may be helpful MUSIC for getting to sleep or for meditating You can order it from on line.  You might find the Chill channel on Pandora and explore the artists that you like better.   Take care of yourself.  No one else is standing up to do the job and only you know what you need.   GET SERIOUS about taking care of yourself.  Do the next right thing and that often means doing something to care for yourself along the lines of are you hungry, are you angry, are you lonely, are you tired, are you scared?  [employer] is what that stands for.  Call if problems or concerns.

## 2013-04-05 ENCOUNTER — Telehealth (HOSPITAL_COMMUNITY): Payer: Self-pay | Admitting: Psychiatry

## 2013-04-05 ENCOUNTER — Encounter (HOSPITAL_COMMUNITY): Payer: Self-pay | Admitting: Psychiatry

## 2013-04-05 ENCOUNTER — Telehealth (HOSPITAL_COMMUNITY): Payer: Self-pay

## 2013-04-05 ENCOUNTER — Ambulatory Visit (INDEPENDENT_AMBULATORY_CARE_PROVIDER_SITE_OTHER): Payer: 59 | Admitting: Psychiatry

## 2013-04-05 VITALS — BP 134/100 | HR 84 | Ht 70.5 in | Wt 203.6 lb

## 2013-04-05 DIAGNOSIS — F952 Tourette's disorder: Secondary | ICD-10-CM

## 2013-04-05 DIAGNOSIS — F410 Panic disorder [episodic paroxysmal anxiety] without agoraphobia: Secondary | ICD-10-CM

## 2013-04-05 DIAGNOSIS — F429 Obsessive-compulsive disorder, unspecified: Secondary | ICD-10-CM

## 2013-04-05 DIAGNOSIS — F988 Other specified behavioral and emotional disorders with onset usually occurring in childhood and adolescence: Secondary | ICD-10-CM

## 2013-04-05 DIAGNOSIS — F5105 Insomnia due to other mental disorder: Secondary | ICD-10-CM

## 2013-04-05 DIAGNOSIS — F902 Attention-deficit hyperactivity disorder, combined type: Secondary | ICD-10-CM

## 2013-04-05 DIAGNOSIS — F419 Anxiety disorder, unspecified: Secondary | ICD-10-CM

## 2013-04-05 DIAGNOSIS — F411 Generalized anxiety disorder: Secondary | ICD-10-CM

## 2013-04-05 DIAGNOSIS — G894 Chronic pain syndrome: Secondary | ICD-10-CM

## 2013-04-05 DIAGNOSIS — F319 Bipolar disorder, unspecified: Secondary | ICD-10-CM

## 2013-04-05 MED ORDER — AMPHETAMINE-DEXTROAMPHETAMINE 30 MG PO TABS: 30.0000 mg | ORAL_TABLET | Freq: Three times a day (TID) | ORAL | Status: DC

## 2013-04-05 MED ORDER — AMPHETAMINE-DEXTROAMPHET ER 30 MG PO CP24: 30.0000 mg | ORAL_CAPSULE | Freq: Three times a day (TID) | ORAL | Status: DC

## 2013-04-05 NOTE — Progress Notes (Signed)
[facility] [address] Progress Note Todd Gilbert MRN: [id number] DOB: 1971-10-08 Age: 42 y.o.  Date: 04/05/2013 Start Time: 9:05 AM End Time: 9:30 AM  Chief Complaint: Chief Complaint  Patient presents with  . ADHD  . Depression  . Follow-up  . Medication Refill   Subjective: "I'm doing okay with the Adderall and that other medication I'm not taking, it doesn't do anything for me". Depression 2/10 and Anxiety 5/10, where 1 is the best and 10 is the worst.  Pain issues today 3/10 along the sciatic nerve and lower back as well as rotator cuff of (L) shoulder. Pt comes back for follow appointment.  Pt reports that he is compliant with the psychotropic medications with fair benefit and no noticeable effects.  He asks for a higher dose of Adderall.  He is working 12 to 15 hours a day and the 20 mg just doesn't help him long enough.  He is supposed to follow up with pain management, but says that he hasn't gotten a call back form them.   History of Chief Complaint: Pt first sought help at age 61 and has been in treatment pretty much ever since then.   He has been identified as LD and never graduated from HS.  He kept getting into trouble and getting kicked out of schools.  He has struggled with anxiety and depression and tried on several medications listed below.  He found the best benefit from Adderall.  He comes seeking other meds that don't cost $130 a month.  Since starting Adderall he has not been in trouble with the law.   HPI see above  Review of Systems Neuro: no headaches, ataxia, weakness GI: no N/V/D/cramps/constipation MS: no weakness, muscle cramps, aches. He notes some joint aches and could use chrondrotin sulfate with glucosamine.  Physical Exam Vitals: BP 134/100  Pulse 84  Ht 5' 10.5" (1.791 m)  Wt 203 lb 9.6 oz (92.352 kg)  BMI 28.79 kg/m2   Depressive Symptoms: depressed mood, insomnia, difficulty concentrating, suicidal thoughts without  plan, anxiety, panic attacks,  (Hypo) Manic Symptoms:   Elevated Mood:  Negative Irritable Mood:  Yes Grandiosity:  history of Distractibility:  Yes Labiality of Mood:  Yes Delusions:  Yes Hallucinations:  Yes Impulsivity:  Yes Sexually Inappropriate Behavior:  No Financial Extravagance:  history of Flight of Ideas:  No  Anxiety Symptoms: Excessive Worry:  Yes Panic Symptoms:  Yes Agoraphobia:  Yes Obsessive Compulsive: Yes  Symptoms: having things in perfect order and exactly right Specific Phobias:  No Social Anxiety:  Yes  Psychotic Symptoms:  Hallucinations: Yes Visual Delusions:  No Paranoia:  Yes   Ideas of Reference:  No  PTSD Symptoms: Ever had a traumatic exposure:  Yes Had a traumatic exposure in the last month:  No Re-experiencing: Yes Flashbacks Hypervigilance:  No Hyperarousal: No Difficulty Concentrating Avoidance: Yes Decreased Interest/Participation  Traumatic Brain Injury: Yes Blunt Trauma History of Loss of Consciousness:  Yes Seizure History:  No Cardiac History:  No  Past Psychiatric History: Diagnosis: ADHD, Bipolar disorder  Hospitalizations: adult twice  Outpatient Care: since age 34  Substance Abuse Care: adult twice  Self-Mutilation: none  Suicidal Attempts: none  Violent Behaviors: in the past   Allergies: Allergies  Allergen Reactions  . Lithium Other (See Comments)    Bizarre behavior at night, climbing out the window and bringing mail into the bedroom  . Tegretol (Carbamazepine) Other (See Comments)    Way out there VIVID dreams   Medical  History: Past Medical History  Diagnosis Date  . ADHD (attention deficit hyperactivity disorder)   . Anxiety   . Bipolar disorder   . Depression   . Obsessive-compulsive disorder   . Psychosis    Surgical History: Past Surgical History  Procedure Laterality Date  . Hand tendon surgery  10/13/1996   Family History: family history includes Alcohol abuse in his father; Anxiety  disorder in his father; and Dementia in his maternal grandmother.  There is no history of OCD, and ADD / ADHD, and Bipolar disorder, and Depression, and Drug abuse, and Paranoid behavior, and Schizophrenia, and Seizures, and Sexual abuse, and Physical abuse, . Reviewed and nothing new is noted today.  Current Medications:  Current Outpatient Prescriptions  Medication Sig Dispense Refill  . amphetamine-dextroamphetamine (ADDERALL) 20 MG tablet Take 1 tablet (20 mg total) by mouth daily.  30 tablet  0  . amphetamine-dextroamphetamine (ADDERALL) 20 MG tablet Take 1 tablet (20 mg total) by mouth 3 (three) times daily.  90 tablet  0  . amphetamine-dextroamphetamine (ADDERALL) 20 MG tablet Take 1 tablet (20 mg total) by mouth 3 (three) times daily.  90 tablet  0  . DULoxetine (CYMBALTA) 30 MG capsule Take 1 capsule (30 mg total) by mouth 2 (two) times daily.  60 capsule  2  . gabapentin (NEURONTIN) 300 MG capsule Take 1-2 capsules (300-600 mg total) by mouth 4 (four) times daily.  240 capsule  1  . methocarbamol (ROBAXIN) 750 MG tablet Take 1 tablet (750 mg total) by mouth 4 (four) times daily.  120 tablet  1   No current facility-administered medications for this visit.    Previous Psychotropic Medications: Medication Dose   Paxil   Vistaril   Zoloft   Lamictal   Depakote   Wellbutrin   Klonopin   Adderall   Valium   Ritalin   Remeron   Clonidine/Kapvay   Cymbalta   Neurontin   Effexor    Substance Abuse History in the last 12 months: Substance Age of 1st Use Last Use Amount Specific Type  Nicotine  13  2 weeks  dip    Alcohol  14  yesterday  two 12 oz  beer  Cannabis  14  years      Opiates  none        Cocaine  [age]      Methamphetamines  none        LSD  17  18      Ecstasy  none         Benzodiazepines  30's  last night  5mg   Valium  Caffeine  childhood  today  swallow  soft drink  Inhalants  none        Others:      Sugar  childhood  today    Medical Consequences of  Substance Abuse: none Legal Consequences of Substance Abuse: none Family Consequences of Substance Abuse: none Blackouts:  No DT's:  No Withdrawal Symptoms:  No   Social History: Current Place of Residence: [address] [place] [address] Place of Birth: [place] Family Members: wife step son Marital Status:  Married Children: 3  Sons: 2  Daughters: 1 Relationships: wife Education:  dropped out in 11 th grade Educational Problems/Performance: LD and ADHD Religious Beliefs/Practices: none History of Abuse: none Occupational Experiences; none Military History:  Writer History: none Hobbies/Interests: fishing, watching sports, walking outside  Mental Status Examination/Evaluation: Objective:  Appearance: Casual  Eye  Contact::  Good  Speech:  Clear and Coherent  Volume:  Normal  Mood:  Anxious and slightly depressed and distracted  Affect:  Congruent  Thought Process:  Coherent, Intact and Logical  Orientation:  Full (Time, Place, and Person)  Thought Content:  Hallucinations: Visual  Suicidal Thoughts:  No  Homicidal Thoughts:  No  Judgement:  Fair  Insight:  Fair  Psychomotor Activity:  Increased and Restlessness  Akathisia:  No  Handed:  Right  AIMS (if indicated):    Assets:  Communication Skills Desire for Improvement   Lab Results: No results found for this or any previous visit (from the past 2016 hour(s)).  Assessment:   AXIS I ADHD, inattentive type, Generalized Anxiety Disorder, Obsessive Compulsive Disorder, Panic Disorder and Tourette Syndrome  AXIS II Deferred  AXIS III Past Medical History  Diagnosis Date  . ADHD (attention deficit hyperactivity disorder)   . Anxiety   . Bipolar disorder   . Depression   . Obsessive-compulsive disorder   . Psychosis      AXIS IV other psychosocial or environmental problems  AXIS V 61-70 mild symptoms   Therapeutic Plan: Psychotherapy: problem solving  Medications: Effexor, Kapvay, and Clonidine   Routine PRN Medications:  Negative  Consultations:   Safety Concerns:  Very slight with his history of violence, but much improved  Other:     Plan/Discussion: I took his vitals.  I reviewed CC, tobacco/med/surg Hx, meds effects/ side effects, problem list, therapies and responses as well as current situation/symptoms discussed options. Try increased dose of Adderal for the long days he works See orders and pt instructions for more details.  MEDICATIONS this encounter: No orders of the defined types were placed in this encounter.    Medical Decision Making Problem Points:  Established problem, stable/improving (1), Established problem, worsening (2), Review of last therapy session (1) and Review of psycho-social stressors (1) Data Points:  Review or order clinical lab tests (1) Review of medication regiment & side effects (2) Review of new medications or change in dosage (2)  I certify that outpatient services furnished can reasonably be expected to improve the patient's condition.   Orson Aloe, MD, [facility]

## 2013-04-05 NOTE — Addendum Note (Signed)
Addended by: Mike Craze on: 04/05/2013 01:55 PM   Modules accepted: Orders, Medications

## 2013-04-05 NOTE — Patient Instructions (Signed)
Check back with family doctor for more pain management treatment.  Call if problems or concerns.

## 2013-04-06 ENCOUNTER — Telehealth (HOSPITAL_COMMUNITY): Payer: Self-pay | Admitting: Psychiatry

## 2013-04-07 ENCOUNTER — Telehealth (HOSPITAL_COMMUNITY): Payer: Self-pay | Admitting: Psychiatry

## 2013-04-08 NOTE — Telephone Encounter (Signed)
Preauth sent multiple times after talking to them about approving REGULAR instead of the XR form.

## 2013-07-07 ENCOUNTER — Encounter (HOSPITAL_COMMUNITY): Payer: Self-pay | Admitting: Psychiatry

## 2013-07-07 ENCOUNTER — Ambulatory Visit (HOSPITAL_COMMUNITY): Payer: Self-pay | Admitting: Psychiatry

## 2013-07-07 ENCOUNTER — Ambulatory Visit (INDEPENDENT_AMBULATORY_CARE_PROVIDER_SITE_OTHER): Payer: 59 | Admitting: Psychiatry

## 2013-07-07 VITALS — BP 130/88 | Ht 70.0 in | Wt 202.0 lb

## 2013-07-07 DIAGNOSIS — F909 Attention-deficit hyperactivity disorder, unspecified type: Secondary | ICD-10-CM

## 2013-07-07 DIAGNOSIS — F902 Attention-deficit hyperactivity disorder, combined type: Secondary | ICD-10-CM

## 2013-07-07 MED ORDER — AMPHETAMINE-DEXTROAMPHETAMINE 30 MG PO TABS: 30.0000 mg | ORAL_TABLET | Freq: Three times a day (TID) | ORAL | Status: DC

## 2013-07-07 NOTE — Progress Notes (Signed)
Patient ID: Todd Gilbert, male   DOB: 1970-11-29, 42 y.o.   MRN: [id number] [facility] [address] Progress Note Todd Gilbert MRN: [id number] DOB: 1971/03/10 Age: 42 y.o.  Date: 07/07/2013 Start Time: 9:05 AM End Time: 9:30 AM  Chief Complaint: Chief Complaint  Patient presents with  . ADHD  . Medication Refill   Subjective: "I'm doing well."  This patient is a 42 year old married white male lives with his wife in [place]. He has 3 children who live with his ex-wife in [place]. He works for a Education officer, environmental. Air traffic controller.  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 He was treated with Ritalin. He was "wild" as a teenager and young adult and dropped out of high school. He was in jail numerous times and used to drink and use drugs. Since he's been on Adderall he's been doing much better. He is much less impulsive and he able to stay focused at work. He is eating and sleeping well and doesn't endorse any symptoms of depression or anxiety. At times he is a bit irritable with his family.  He does have some chronic pain issues due to years of heavy lifting. He is going to see his family physician regarding these issues   History of Chief Complaint: Pt first sought help at age 42 and has been in treatment pretty much ever since then.   He has been identified as LD and never graduated from HS.  He kept getting into trouble and getting kicked out of schools.  He has struggled with anxiety and depression and tried on several medications listed below.  He found the best benefit from Adderall.  He comes seeking other meds that don't cost $130 a month.  Since starting Adderall he has not been in trouble with the law.   HPI see above  Review of Systems Neuro: no headaches, ataxia, weakness GI: no N/V/D/cramps/constipation MS: no weakness, muscle cramps, aches. He notes some joint aches and could use chrondrotin sulfate with glucosamine.  Physical Exam Vitals: BP 130/88  Ht  5\' 10"  (1.778 m)  Wt 202 lb (91.627 kg)  BMI 28.98 kg/m2   Depressive Symptoms: depressed mood, insomnia, difficulty concentrating, suicidal thoughts without plan, anxiety, panic attacks,  (Hypo) Manic Symptoms:   Elevated Mood:  Negative Irritable Mood:  Yes Grandiosity:  history of Distractibility:  Yes Labiality of Mood:  Yes Delusions:  Yes Hallucinations:  Yes Impulsivity:  Yes Sexually Inappropriate Behavior:  No Financial Extravagance:  history of Flight of Ideas:  No  Anxiety Symptoms: Excessive Worry:  Yes Panic Symptoms:  Yes Agoraphobia:  Yes Obsessive Compulsive: Yes  Symptoms: having things in perfect order and exactly right Specific Phobias:  No Social Anxiety:  Yes  Psychotic Symptoms:  Hallucinations: Yes Visual Delusions:  No Paranoia:  Yes   Ideas of Reference:  No  PTSD Symptoms: Ever had a traumatic exposure:  Yes Had a traumatic exposure in the last month:  No Re-experiencing: Yes Flashbacks Hypervigilance:  No Hyperarousal: No Difficulty Concentrating Avoidance: Yes Decreased Interest/Participation  Traumatic Brain Injury: Yes Blunt Trauma History of Loss of Consciousness:  Yes Seizure History:  No Cardiac History:  No  Past Psychiatric History: Diagnosis: ADHD, Bipolar disorder  Hospitalizations: adult twice  Outpatient Care: since age 42  Substance Abuse Care: adult twice  Self-Mutilation: none  Suicidal Attempts: none  Violent Behaviors: in the past   Allergies: Allergies  Allergen Reactions  . Lithium Other (See Comments)    Bizarre  behavior at night, climbing out the window and bringing mail into the bedroom  . Tegretol [Carbamazepine] Other (See Comments)    Way out there VIVID dreams   Medical History: Past Medical History  Diagnosis Date  . ADHD (attention deficit hyperactivity disorder)   . Anxiety   . Bipolar disorder   . Depression   . Obsessive-compulsive disorder   . Psychosis   . Right sciatic nerve pain     Surgical History: Past Surgical History  Procedure Laterality Date  . Hand tendon surgery  10/13/1996   Family History: family history includes Alcohol abuse in his father; Anxiety disorder in his father; Dementia in his maternal grandmother. There is no history of OCD, ADD / ADHD, Bipolar disorder, Depression, Drug abuse, Paranoid behavior, Schizophrenia, Seizures, Sexual abuse, or Physical abuse. Reviewed and nothing new is noted today.  Current Medications:  Current Outpatient Prescriptions  Medication Sig Dispense Refill  . amphetamine-dextroamphetamine (ADDERALL) 30 MG tablet Take 1 tablet (30 mg total) by mouth 3 (three) times daily.  90 tablet  0  . amphetamine-dextroamphetamine (ADDERALL) 30 MG tablet Take 1 tablet (30 mg total) by mouth 3 (three) times daily.  90 tablet  0  . amphetamine-dextroamphetamine (ADDERALL) 30 MG tablet Take 1 tablet (30 mg total) by mouth 3 (three) times daily.  90 tablet  0   No current facility-administered medications for this visit.    Previous Psychotropic Medications: Medication Dose   Paxil   Vistaril   Zoloft   Lamictal   Depakote   Wellbutrin   Klonopin   Adderall   Valium   Ritalin   Remeron   Clonidine/Kapvay   Cymbalta   Neurontin   Effexor    Substance Abuse History in the last 12 months: Substance Age of 1st Use Last Use Amount Specific Type  Nicotine  13  2 weeks  dip    Alcohol  14  yesterday  two 12 oz  beer  Cannabis  14  years      Opiates  none        Cocaine  [age]      Methamphetamines  none        LSD  17  18      Ecstasy  none         Benzodiazepines  30's  last night  5mg   Valium  Caffeine  childhood  today  swallow  soft drink  Inhalants  none        Others:      Sugar  childhood  today    Medical Consequences of Substance Abuse: none Legal Consequences of Substance Abuse: none Family Consequences of Substance Abuse: none Blackouts:  No DT's:  No Withdrawal Symptoms:  No   Social  History: Current Place of Residence: [address] [place] [address] Place of Birth: [place] Family Members: wife step son Marital Status:  Married Children: 3  Sons: 2  Daughters: 1 Relationships: wife Education:  dropped out in 11 th grade Educational Problems/Performance: LD and ADHD Religious Beliefs/Practices: none History of Abuse: none Occupational Experiences; none Military History:  Writer History: none Hobbies/Interests: fishing, watching sports, walking outside  Mental Status Examination/Evaluation: Objective:  Appearance: Casual  Eye Contact::  Good  Speech:  Clear and Coherent  Volume:  Normal  Mood:  Anxious and slightly depressed and distracted  Affect:  Congruent  Thought Process:  Coherent, Intact and Logical  Orientation:  Full (Time, Place, and Person)  Thought Content:  Hallucinations: Visual  Suicidal Thoughts:  No  Homicidal Thoughts:  No  Judgement:  Fair  Insight:  Fair  Psychomotor Activity:  Increased and Restlessness  Akathisia:  No  Handed:  Right  AIMS (if indicated):    Assets:  Communication Skills Desire for Improvement   Lab Results: No results found for this or any previous visit (from the past 2016 hour(s)).  Assessment:   AXIS I ADHD, inattentive type, Generalized Anxiety Disorder, Obsessive Compulsive Disorder, Panic Disorder and Tourette Syndrome  AXIS II Deferred  AXIS III Past Medical History  Diagnosis Date  . ADHD (attention deficit hyperactivity disorder)   . Anxiety   . Bipolar disorder   . Depression   . Obsessive-compulsive disorder   . Psychosis   . Right sciatic nerve pain      AXIS IV other psychosocial or environmental problems  AXIS V 61-70 mild symptoms   Therapeutic Plan: Psychotherapy: problem solving  Medications: Effexor, Kapvay, and Clonidine  Routine PRN Medications:  Negative  Consultations:   Safety Concerns:  Very slight with his history of violence, but much improved  Other:      Plan/Discussion: I took his vitals.  I reviewed CC, tobacco/med/surg Hx, meds effects/ side effects, problem list, therapies and responses as well as current situation/symptoms discussed options. He will continue his current dose of Adderall and return in 3 months See orders and pt instructions for more details.  MEDICATIONS this encounter: Meds ordered this encounter  Medications  . amphetamine-dextroamphetamine (ADDERALL) 30 MG tablet    Sig: Take 1 tablet (30 mg total) by mouth 3 (three) times daily.    Dispense:  90 tablet    Refill:  0  . amphetamine-dextroamphetamine (ADDERALL) 30 MG tablet    Sig: Take 1 tablet (30 mg total) by mouth 3 (three) times daily.    Dispense:  90 tablet    Refill:  0    Do not fill until 08/06/13  . amphetamine-dextroamphetamine (ADDERALL) 30 MG tablet    Sig: Take 1 tablet (30 mg total) by mouth 3 (three) times daily.    Dispense:  90 tablet    Refill:  0    Do not fill before 11/25 14    Medical Decision Making Problem Points:  Established problem, stable/improving (1), Established problem, worsening (2), Review of last therapy session (1) and Review of psycho-social stressors (1) Data Points:  Review or order clinical lab tests (1) Review of medication regiment & side effects (2) Review of new medications or change in dosage (2)  I certify that outpatient services furnished can reasonably be expected to improve the patient's condition.   [name], MD, [facility]

## 2013-08-05 ENCOUNTER — Other Ambulatory Visit (HOSPITAL_COMMUNITY): Payer: Self-pay | Admitting: Psychiatry

## 2013-08-05 ENCOUNTER — Telehealth (HOSPITAL_COMMUNITY): Payer: Self-pay | Admitting: *Deleted

## 2013-08-05 DIAGNOSIS — F902 Attention-deficit hyperactivity disorder, combined type: Secondary | ICD-10-CM

## 2013-08-05 MED ORDER — AMPHETAMINE-DEXTROAMPHETAMINE 30 MG PO TABS: 30.0000 mg | ORAL_TABLET | Freq: Three times a day (TID) | ORAL | Status: DC

## 2013-08-05 NOTE — Telephone Encounter (Signed)
Will leave script for 30 day supply--pt to pick up

## 2013-09-28 ENCOUNTER — Encounter (HOSPITAL_COMMUNITY): Payer: Self-pay | Admitting: Psychiatry

## 2013-09-28 ENCOUNTER — Ambulatory Visit (INDEPENDENT_AMBULATORY_CARE_PROVIDER_SITE_OTHER): Payer: 59 | Admitting: Psychiatry

## 2013-09-28 VITALS — BP 120/82 | Ht 70.0 in | Wt 209.0 lb

## 2013-09-28 DIAGNOSIS — F411 Generalized anxiety disorder: Secondary | ICD-10-CM

## 2013-09-28 DIAGNOSIS — F952 Tourette's disorder: Secondary | ICD-10-CM

## 2013-09-28 DIAGNOSIS — F902 Attention-deficit hyperactivity disorder, combined type: Secondary | ICD-10-CM

## 2013-09-28 DIAGNOSIS — F429 Obsessive-compulsive disorder, unspecified: Secondary | ICD-10-CM

## 2013-09-28 DIAGNOSIS — F410 Panic disorder [episodic paroxysmal anxiety] without agoraphobia: Secondary | ICD-10-CM

## 2013-09-28 DIAGNOSIS — F988 Other specified behavioral and emotional disorders with onset usually occurring in childhood and adolescence: Secondary | ICD-10-CM

## 2013-09-28 MED ORDER — AMPHETAMINE-DEXTROAMPHETAMINE 30 MG PO TABS: 30.0000 mg | ORAL_TABLET | Freq: Three times a day (TID) | ORAL | Status: DC

## 2013-09-28 MED ORDER — DIAZEPAM 10 MG PO TABS: 10.0000 mg | ORAL_TABLET | Freq: Every evening | ORAL | Status: DC | PRN

## 2013-09-28 NOTE — Progress Notes (Signed)
Patient ID: Todd Gilbert, male   DOB: 11/16/1970, 42 y.o.   MRN: [id number] Patient ID: Todd Gilbert, male   DOB: 07-Feb-1971, 42 y.o.   MRN: [id number] [facility] [id number] Progress Note Todd Gilbert MRN: [id number] DOB: [birth year] Age: 42 y.o.  Date: 09/28/2013 Start Time: 9:05 AM End Time: 9:30 AM  Chief Complaint: Chief Complaint  Patient presents with  . ADHD  . Follow-up   Subjective: "I'm doing well."  This patient is a 42 year old married white male lives with his wife in [place]. He has 3 children who live with his ex-wife in [place]. He works for a Education officer, environmental. Air traffic controller.  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 He was treated with Ritalin. He was "wild" as a teenager and young adult and dropped out of high school. He was in jail numerous times and used to drink and use drugs. Since he's been on Adderall he's been doing much better. He is much less impulsive and he able to stay focused at work. He is eating and sleeping well and doesn't endorse any symptoms of depression or anxiety. At times he is a bit irritable with his family.  She returns after 3 months. He continues to do well. He is working very hard and often takes an extra hours at work. He likes to stay busy. His mood is been good and he is well focused. He states that sometimes he wakes up in the middle the night and can't get back to sleep. He took diazepam in the past for this and would like to try it again. He is not drinking or using drugs   History of Chief Complaint: Pt first sought help at age 68 and has been in treatment pretty much ever since then.   He has been identified as LD and never graduated from HS.  He kept getting into trouble and getting kicked out of schools.  He has struggled with anxiety and depression and tried on several medications listed below.  He found the best benefit from Adderall.  He comes seeking other meds that don't cost $130 a month.  Since starting  Adderall he has not been in trouble with the law.   HPI see above  Review of Systems Neuro: no headaches, ataxia, weakness GI: no N/V/D/cramps/constipation MS: no weakness, muscle cramps, aches. He notes some joint aches and could use chrondrotin sulfate with glucosamine.  Physical Exam Vitals: BP 120/82  Ht 5\' 10"  (1.778 m)  Wt 209 lb (94.802 kg)  BMI 29.99 kg/m2   Depressive Symptoms: depressed mood, insomnia, difficulty concentrating, suicidal thoughts without plan, anxiety, panic attacks,  (Hypo) Manic Symptoms:   Elevated Mood:  Negative Irritable Mood:  Yes Grandiosity:  history of Distractibility:  Yes Labiality of Mood:  Yes Delusions:  Yes Hallucinations:  Yes Impulsivity:  Yes Sexually Inappropriate Behavior:  No Financial Extravagance:  history of Flight of Ideas:  No  Anxiety Symptoms: Excessive Worry:  Yes Panic Symptoms:  Yes Agoraphobia:  Yes Obsessive Compulsive: Yes  Symptoms: having things in perfect order and exactly right Specific Phobias:  No Social Anxiety:  Yes  Psychotic Symptoms:  Hallucinations: Yes Visual Delusions:  No Paranoia:  Yes   Ideas of Reference:  No  PTSD Symptoms: Ever had a traumatic exposure:  Yes Had a traumatic exposure in the last month:  No Re-experiencing: Yes Flashbacks Hypervigilance:  No Hyperarousal: No Difficulty Concentrating Avoidance: Yes Decreased Interest/Participation  Traumatic Brain Injury: Yes Blunt Trauma History  of Loss of Consciousness:  Yes Seizure History:  No Cardiac History:  No  Past Psychiatric History: Diagnosis: ADHD, Bipolar disorder  Hospitalizations: adult twice  Outpatient Care: since age 4  Substance Abuse Care: adult twice  Self-Mutilation: none  Suicidal Attempts: none  Violent Behaviors: in the past   Allergies: Allergies  Allergen Reactions  . Lithium Other (See Comments)    Bizarre behavior at night, climbing out the window and bringing mail into the bedroom   . Tegretol [Carbamazepine] Other (See Comments)    Way out there VIVID dreams   Medical History: Past Medical History  Diagnosis Date  . ADHD (attention deficit hyperactivity disorder)   . Anxiety   . Bipolar disorder   . Depression   . Obsessive-compulsive disorder   . Psychosis   . Right sciatic nerve pain    Surgical History: Past Surgical History  Procedure Laterality Date  . Hand tendon surgery  10/13/1996   Family History: family history includes Alcohol abuse in his father; Anxiety disorder in his father; Dementia in his maternal grandmother. There is no history of OCD, ADD / ADHD, Bipolar disorder, Depression, Drug abuse, Paranoid behavior, Schizophrenia, Seizures, Sexual abuse, or Physical abuse. Reviewed and nothing new is noted today.  Current Medications:  Current Outpatient Prescriptions  Medication Sig Dispense Refill  . amphetamine-dextroamphetamine (ADDERALL) 30 MG tablet Take 1 tablet (30 mg total) by mouth 3 (three) times daily.  90 tablet  0  . amphetamine-dextroamphetamine (ADDERALL) 30 MG tablet Take 1 tablet (30 mg total) by mouth 3 (three) times daily.  90 tablet  0  . amphetamine-dextroamphetamine (ADDERALL) 30 MG tablet Take 1 tablet (30 mg total) by mouth 3 (three) times daily.  90 tablet  0  . diazepam (VALIUM) 10 MG tablet Take 1 tablet (10 mg total) by mouth at bedtime as needed for anxiety.  30 tablet  2   No current facility-administered medications for this visit.    Previous Psychotropic Medications: Medication Dose   Paxil   Vistaril   Zoloft   Lamictal   Depakote   Wellbutrin   Klonopin   Adderall   Valium   Ritalin   Remeron   Clonidine/Kapvay   Cymbalta   Neurontin   Effexor    Substance Abuse History in the last 12 months: Substance Age of 1st Use Last Use Amount Specific Type  Nicotine  13  2 weeks  dip    Alcohol  14  yesterday  two 12 oz  beer  Cannabis  14  years      Opiates  none        Cocaine  [age]       Methamphetamines  none        LSD  17  18      Ecstasy  none         Benzodiazepines  30's  last night  5mg   Valium  Caffeine  childhood  today  swallow  soft drink  Inhalants  none        Others:      Sugar  childhood  today    Medical Consequences of Substance Abuse: none Legal Consequences of Substance Abuse: none Family Consequences of Substance Abuse: none Blackouts:  No DT's:  No Withdrawal Symptoms:  No   Social History: Current Place of Residence: [address] [place] [address] Place of Birth: [place] Family Members: wife step son Marital Status:  Married Children: 3  Sons: 2  Daughters: 1 Relationships: wife Education:  dropped out in 10 th grade Educational Problems/Performance: LD and ADHD Religious Beliefs/Practices: none History of Abuse: none Occupational Experiences; none Military History:  Writer History: none Hobbies/Interests: fishing, watching sports, walking outside  Mental Status Examination/Evaluation: Objective:  Appearance: Casual  Eye Contact::  Good  Speech:  Clear and Coherent  Volume:  Normal  Mood:  Good today   Affect:  Congruent  Thought Process:  Coherent, Intact and Logical  Orientation:  Full (Time, Place, and Person)  Thought Content:  Hallucinations: Visual  Suicidal Thoughts:  No  Homicidal Thoughts:  No  Judgement:  Fair  Insight:  Fair  Psychomotor Activity:  Increased and Restlessness  Akathisia:  No  Handed:  Right  AIMS (if indicated):    Assets:  Communication Skills Desire for Improvement   Lab Results: No results found for this or any previous visit (from the past 2016 hour(s)).  Assessment:   AXIS I ADHD, inattentive type, Generalized Anxiety Disorder, Obsessive Compulsive Disorder, Panic Disorder and Tourette Syndrome  AXIS II Deferred  AXIS III Past Medical History  Diagnosis Date  . ADHD (attention deficit hyperactivity disorder)   . Anxiety   . Bipolar disorder   . Depression   .  Obsessive-compulsive disorder   . Psychosis   . Right sciatic nerve pain      AXIS IV other psychosocial or environmental problems  AXIS V 61-70 mild symptoms   Therapeutic Plan: Psychotherapy: problem solving  Medications: Adderall 30 mg 3 times a day, Valium 10 mg each bedtime as needed   Routine PRN Medications:  Negative  Consultations:   Safety Concerns:  Very slight with his history of violence, but much improved  Other:     Plan/Discussion: I took his vitals.  I reviewed CC, tobacco/med/surg Hx, meds effects/ side effects, problem list, therapies and responses as well as current situation/symptoms discussed options. He will continue his current dose of Adderall. He will be prescribed Valium 10 mg each bedtime as needed and return in 3 months See orders and pt instructions for more details.  MEDICATIONS this encounter: Meds ordered this encounter  Medications  . amphetamine-dextroamphetamine (ADDERALL) 30 MG tablet    Sig: Take 1 tablet (30 mg total) by mouth 3 (three) times daily.    Dispense:  90 tablet    Refill:  0  . amphetamine-dextroamphetamine (ADDERALL) 30 MG tablet    Sig: Take 1 tablet (30 mg total) by mouth 3 (three) times daily.    Dispense:  90 tablet    Refill:  0    Do not fill before 10/29/13  . amphetamine-dextroamphetamine (ADDERALL) 30 MG tablet    Sig: Take 1 tablet (30 mg total) by mouth 3 (three) times daily.    Dispense:  90 tablet    Refill:  0    Do not fill until 11/27/13  . diazepam (VALIUM) 10 MG tablet    Sig: Take 1 tablet (10 mg total) by mouth at bedtime as needed for anxiety.    Dispense:  30 tablet    Refill:  2    Medical Decision Making Problem Points:  Established problem, stable/improving (1), Established problem, worsening (2), Review of last therapy session (1) and Review of psycho-social stressors (1) Data Points:  Review or order clinical lab tests (1) Review of medication regiment & side effects (2) Review of new  medications or change in dosage (2)  I certify that outpatient services furnished can reasonably be  expected to improve the patient's condition.   [name], MD, [facility]

## 2013-11-02 ENCOUNTER — Other Ambulatory Visit (HOSPITAL_COMMUNITY): Payer: Self-pay | Admitting: Internal Medicine

## 2013-11-02 ENCOUNTER — Ambulatory Visit (HOSPITAL_COMMUNITY)
Admission: RE | Admit: 2013-11-02 | Discharge: 2013-11-02 | Disposition: A | Discharge status: Home / Self Care | Payer: 59 | Source: Ambulatory Visit | Attending: Internal Medicine | Admitting: Internal Medicine

## 2013-11-02 DIAGNOSIS — H538 Other visual disturbances: Secondary | ICD-10-CM | POA: Insufficient documentation

## 2013-11-02 DIAGNOSIS — R51 Headache: Secondary | ICD-10-CM

## 2013-11-02 DIAGNOSIS — R519 Headache, unspecified: Secondary | ICD-10-CM

## 2013-11-02 DIAGNOSIS — J3489 Other specified disorders of nose and nasal sinuses: Secondary | ICD-10-CM | POA: Insufficient documentation

## 2013-12-28 ENCOUNTER — Ambulatory Visit (INDEPENDENT_AMBULATORY_CARE_PROVIDER_SITE_OTHER): Payer: 59 | Admitting: Psychiatry

## 2013-12-28 ENCOUNTER — Encounter (HOSPITAL_COMMUNITY): Payer: Self-pay | Admitting: Psychiatry

## 2013-12-28 VITALS — BP 120/84 | Ht 70.0 in | Wt 211.0 lb

## 2013-12-28 DIAGNOSIS — F429 Obsessive-compulsive disorder, unspecified: Secondary | ICD-10-CM

## 2013-12-28 DIAGNOSIS — F952 Tourette's disorder: Secondary | ICD-10-CM

## 2013-12-28 DIAGNOSIS — F410 Panic disorder [episodic paroxysmal anxiety] without agoraphobia: Secondary | ICD-10-CM

## 2013-12-28 DIAGNOSIS — F988 Other specified behavioral and emotional disorders with onset usually occurring in childhood and adolescence: Secondary | ICD-10-CM

## 2013-12-28 DIAGNOSIS — F902 Attention-deficit hyperactivity disorder, combined type: Secondary | ICD-10-CM

## 2013-12-28 DIAGNOSIS — F411 Generalized anxiety disorder: Secondary | ICD-10-CM

## 2013-12-28 MED ORDER — DIAZEPAM 10 MG PO TABS: 10.0000 mg | ORAL_TABLET | Freq: Every evening | ORAL | Status: DC | PRN

## 2013-12-28 MED ORDER — AMPHETAMINE-DEXTROAMPHETAMINE 30 MG PO TABS: 30.0000 mg | ORAL_TABLET | Freq: Three times a day (TID) | ORAL | Status: DC

## 2013-12-28 NOTE — Progress Notes (Signed)
Patient ID: Yorel Redder, male   DOB: 1971/02/25, 43 y.o.   MRN: [id number] Patient ID: Adelaido Nicklaus, male   DOB: 1971-05-09, 43 y.o.   MRN: [id number] Patient ID: Maximus Hoffert, male   DOB: 1971/02/07, 43 y.o.   MRN: [id number] [facility] [id number] Progress Note Eilan Mcinerny MRN: [id number] DOB: May 15, 1971 Age: 43 y.o.  Date: 12/28/2013 Start Time: 9:05 AM End Time: 9:30 AM  Chief Complaint: Chief Complaint  Patient presents with  . ADHD  . Follow-up   Subjective: "I'm doing well."  This patient is a 43 year old married white male lives with his wife in [place]. He has 3 children who live with his ex-wife in [place]. He works for a Education officer, environmental. Air traffic controller.  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 He was treated with Ritalin. He was "wild" as a teenager and young adult and dropped out of high school. He was in jail numerous times and used to drink and use drugs. Since he's been on Adderall he's been doing much better. He is much less impulsive and he able to stay focused at work. He is eating and sleeping well and doesn't endorse any symptoms of depression or anxiety. At times he is a bit irritable with his family.  She returns after 3 months. He continues to do well. He is working very hard and often takes an extra hours at work. He likes to stay busy. His mood is been good and he is well focused.  He is sleeping much better with the Valium at night. He may be getting custody of his 53 year old son from [place] and he is very excited about it   History of Chief Complaint: Pt first sought help at age 37 and has been in treatment pretty much ever since then.   He has been identified as LD and never graduated from HS.  He kept getting into trouble and getting kicked out of schools.  He has struggled with anxiety and depression and tried on several medications listed below.  He found the best benefit from Adderall.  He comes seeking other meds that don't cost  $130 a month.  Since starting Adderall he has not been in trouble with the law.   HPI see above  Review of Systems Neuro: no headaches, ataxia, weakness GI: no N/V/D/cramps/constipation MS: no weakness, muscle cramps, aches. He notes some joint aches and could use chrondrotin sulfate with glucosamine.  Physical Exam Vitals: BP 120/84  Ht 5\' 10"  (1.778 m)  Wt 211 lb (95.709 kg)  BMI 30.28 kg/m2   Depressive Symptoms: depressed mood, insomnia, difficulty concentrating, suicidal thoughts without plan, anxiety, panic attacks,  (Hypo) Manic Symptoms:   Elevated Mood:  Negative Irritable Mood:  Yes Grandiosity:  history of Distractibility:  Yes Labiality of Mood:  Yes Delusions:  Yes Hallucinations:  Yes Impulsivity:  Yes Sexually Inappropriate Behavior:  No Financial Extravagance:  history of Flight of Ideas:  No  Anxiety Symptoms: Excessive Worry:  Yes Panic Symptoms:  Yes Agoraphobia:  Yes Obsessive Compulsive: Yes  Symptoms: having things in perfect order and exactly right Specific Phobias:  No Social Anxiety:  Yes  Psychotic Symptoms:  Hallucinations: Yes Visual Delusions:  No Paranoia:  Yes   Ideas of Reference:  No  PTSD Symptoms: Ever had a traumatic exposure:  Yes Had a traumatic exposure in the last month:  No Re-experiencing: Yes Flashbacks Hypervigilance:  No Hyperarousal: No Difficulty Concentrating Avoidance: Yes Decreased Interest/Participation  Traumatic Brain Injury:  Yes Blunt Trauma History of Loss of Consciousness:  Yes Seizure History:  No Cardiac History:  No  Past Psychiatric History: Diagnosis: ADHD, Bipolar disorder  Hospitalizations: adult twice  Outpatient Care: since age 60  Substance Abuse Care: adult twice  Self-Mutilation: none  Suicidal Attempts: none  Violent Behaviors: in the past   Allergies: Allergies  Allergen Reactions  . Lithium Other (See Comments)    Bizarre behavior at night, climbing out the window and  bringing mail into the bedroom  . Tegretol [Carbamazepine] Other (See Comments)    Way out there VIVID dreams   Medical History: Past Medical History  Diagnosis Date  . ADHD (attention deficit hyperactivity disorder)   . Anxiety   . Bipolar disorder   . Depression   . Obsessive-compulsive disorder   . Psychosis   . Right sciatic nerve pain    Surgical History: Past Surgical History  Procedure Laterality Date  . Hand tendon surgery  10/13/1996   Family History: family history includes Alcohol abuse in his father; Anxiety disorder in his father; Dementia in his maternal grandmother. There is no history of OCD, ADD / ADHD, Bipolar disorder, Depression, Drug abuse, Paranoid behavior, Schizophrenia, Seizures, Sexual abuse, or Physical abuse. Reviewed and nothing new is noted today.  Current Medications:  Current Outpatient Prescriptions  Medication Sig Dispense Refill  . amphetamine-dextroamphetamine (ADDERALL) 30 MG tablet Take 1 tablet (30 mg total) by mouth 3 (three) times daily.  90 tablet  0  . amphetamine-dextroamphetamine (ADDERALL) 30 MG tablet Take 1 tablet (30 mg total) by mouth 3 (three) times daily.  90 tablet  0  . amphetamine-dextroamphetamine (ADDERALL) 30 MG tablet Take 1 tablet (30 mg total) by mouth 3 (three) times daily.  90 tablet  0  . diazepam (VALIUM) 10 MG tablet Take 1 tablet (10 mg total) by mouth at bedtime as needed for anxiety.  30 tablet  2   No current facility-administered medications for this visit.    Previous Psychotropic Medications: Medication Dose   Paxil   Vistaril   Zoloft   Lamictal   Depakote   Wellbutrin   Klonopin   Adderall   Valium   Ritalin   Remeron   Clonidine/Kapvay   Cymbalta   Neurontin   Effexor    Substance Abuse History in the last 12 months: Substance Age of 1st Use Last Use Amount Specific Type  Nicotine  13  2 weeks  dip    Alcohol  14  yesterday  two 12 oz  beer  Cannabis  14  years      Opiates  none         Cocaine  [age]      Methamphetamines  none        LSD  17  18      Ecstasy  none         Benzodiazepines  30's  last night  5mg   Valium  Caffeine  childhood  today  swallow  soft drink  Inhalants  none        Others:      Sugar  childhood  today    Medical Consequences of Substance Abuse: none Legal Consequences of Substance Abuse: none Family Consequences of Substance Abuse: none Blackouts:  No DT's:  No Withdrawal Symptoms:  No   Social History: Current Place of Residence: [address] [place] [address] Place of Birth: [place] Family Members: wife step son Marital Status:  Married Children:  3  Sons: 2  Daughters: 1 Relationships: wife Education:  dropped out in 11 th grade Educational Problems/Performance: LD and ADHD Religious Beliefs/Practices: none History of Abuse: none Occupational Experiences; none Military History:  Writer History: none Hobbies/Interests: fishing, watching sports, walking outside  Mental Status Examination/Evaluation: Objective:  Appearance: Casual  Eye Contact::  Good  Speech:  Clear and Coherent  Volume:  Normal  Mood:  Good today   Affect:  Congruent  Thought Process:  Coherent, Intact and Logical  Orientation:  Full (Time, Place, and Person)  Thought Content:  no  Suicidal Thoughts:  No  Homicidal Thoughts:  No  Judgement:  Fair  Insight:  Fair  Psychomotor Activity:  Increased and Restlessness  Akathisia:  No  Handed:  Right  AIMS (if indicated):    Assets:  Communication Skills Desire for Improvement   Lab Results: No results found for this or any previous visit (from the past 2016 hour(s)).  Assessment:   AXIS I ADHD, inattentive type, Generalized Anxiety Disorder, Obsessive Compulsive Disorder, Panic Disorder and Tourette Syndrome  AXIS II Deferred  AXIS III Past Medical History  Diagnosis Date  . ADHD (attention deficit hyperactivity disorder)   . Anxiety   . Bipolar disorder   . Depression   .  Obsessive-compulsive disorder   . Psychosis   . Right sciatic nerve pain      AXIS IV other psychosocial or environmental problems  AXIS V 61-70 mild symptoms   Therapeutic Plan: Psychotherapy: problem solving  Medications: Adderall 30 mg 3 times a day, Valium 10 mg each bedtime as needed   Routine PRN Medications:  Negative  Consultations:   Safety Concerns:  Very slight with his history of violence, but much improved  Other:     Plan/Discussion: I took his vitals.  I reviewed CC, tobacco/med/surg Hx, meds effects/ side effects, problem list, therapies and responses as well as current situation/symptoms discussed options. He will continue his current dose of Adderall. He will be prescribed Valium 10 mg each bedtime as needed and return in 3 months See orders and pt instructions for more details.  MEDICATIONS this encounter: Meds ordered this encounter  Medications  . diazepam (VALIUM) 10 MG tablet    Sig: Take 1 tablet (10 mg total) by mouth at bedtime as needed for anxiety.    Dispense:  30 tablet    Refill:  2  . amphetamine-dextroamphetamine (ADDERALL) 30 MG tablet    Sig: Take 1 tablet (30 mg total) by mouth 3 (three) times daily.    Dispense:  90 tablet    Refill:  0  . amphetamine-dextroamphetamine (ADDERALL) 30 MG tablet    Sig: Take 1 tablet (30 mg total) by mouth 3 (three) times daily.    Dispense:  90 tablet    Refill:  0    Do not fill before 01/28/14  . amphetamine-dextroamphetamine (ADDERALL) 30 MG tablet    Sig: Take 1 tablet (30 mg total) by mouth 3 (three) times daily.    Dispense:  90 tablet    Refill:  0    Do not fill until 02/27/14    Medical Decision Making Problem Points:  Established problem, stable/improving (1), Established problem, worsening (2), Review of last therapy session (1) and Review of psycho-social stressors (1) Data Points:  Review or order clinical lab tests (1) Review of medication regiment & side effects (2) Review of new  medications or change in dosage (2)  I certify that outpatient services  furnished can reasonably be expected to improve the patient's condition.   Diannia RuderOSS, DEBORAH, MD

## 2014-01-26 ENCOUNTER — Other Ambulatory Visit (HOSPITAL_COMMUNITY): Payer: Self-pay | Admitting: Psychiatry

## 2014-01-26 ENCOUNTER — Telehealth (HOSPITAL_COMMUNITY): Payer: Self-pay | Admitting: Psychiatry

## 2014-01-26 DIAGNOSIS — F902 Attention-deficit hyperactivity disorder, combined type: Secondary | ICD-10-CM

## 2014-01-26 MED ORDER — AMPHETAMINE-DEXTROAMPHETAMINE 30 MG PO TABS: 30.0000 mg | ORAL_TABLET | Freq: Three times a day (TID) | ORAL | Status: DC

## 2014-01-26 NOTE — Telephone Encounter (Signed)
Script given

## 2014-03-13 ENCOUNTER — Encounter (HOSPITAL_COMMUNITY): Payer: Self-pay | Admitting: Psychiatry

## 2014-03-24 ENCOUNTER — Encounter (HOSPITAL_COMMUNITY): Payer: Self-pay | Admitting: Psychiatry

## 2014-03-24 ENCOUNTER — Ambulatory Visit (INDEPENDENT_AMBULATORY_CARE_PROVIDER_SITE_OTHER): Payer: 59 | Admitting: Psychiatry

## 2014-03-24 VITALS — BP 130/80 | Ht 70.0 in | Wt 202.0 lb

## 2014-03-24 DIAGNOSIS — F411 Generalized anxiety disorder: Secondary | ICD-10-CM

## 2014-03-24 DIAGNOSIS — F988 Other specified behavioral and emotional disorders with onset usually occurring in childhood and adolescence: Secondary | ICD-10-CM

## 2014-03-24 DIAGNOSIS — F952 Tourette's disorder: Secondary | ICD-10-CM

## 2014-03-24 DIAGNOSIS — F410 Panic disorder [episodic paroxysmal anxiety] without agoraphobia: Secondary | ICD-10-CM

## 2014-03-24 DIAGNOSIS — F902 Attention-deficit hyperactivity disorder, combined type: Secondary | ICD-10-CM

## 2014-03-24 DIAGNOSIS — F429 Obsessive-compulsive disorder, unspecified: Secondary | ICD-10-CM

## 2014-03-24 MED ORDER — AMPHETAMINE-DEXTROAMPHETAMINE 30 MG PO TABS: 30.0000 mg | ORAL_TABLET | Freq: Three times a day (TID) | ORAL | Status: DC

## 2014-03-24 MED ORDER — DIAZEPAM 10 MG PO TABS: 10.0000 mg | ORAL_TABLET | Freq: Every evening | ORAL | Status: DC | PRN

## 2014-03-24 NOTE — Progress Notes (Signed)
Patient ID: Todd Gilbert, male   DOB: [birth year], 43 y.o.   MRN: [id number] Patient ID: Todd Gilbert, male   DOB: [birth year], 43 y.o.   MRN: [id number] Patient ID: Todd Gilbert, male   DOB: [birth year], 43 y.o.   MRN: [name] Patient ID: Todd Gilbert, male   DOB: [birth year], 43 y.o.   MRN: [id number] [facility] [id number] Progress Note Todd Gilbert MRN: [id number] DOB: [birth year] Age: 43 y.o.  Date: 03/24/2014 Start Time: 9:05 AM End Time: 9:30 AM  Chief Complaint: Chief Complaint  Patient presents with  . ADHD  . Anxiety  . Follow-up   Subjective: "I'm doing well."  This patient is a 43 year old married white male lives with his wife in [place]. He has 3 children who live with his ex-wife in [place]. He works for a Education [employer], [employer] operating a forklift.  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 He was treated with Ritalin. He was "wild" as a teenager and young adult and dropped out of high school. He was in jail numerous times and used to drink and use drugs. Since he's been on Adderall he's been doing much better. He is much less impulsive and he able to stay focused at work. He is eating and sleeping well and doesn't endorse any symptoms of depression or anxiety. At times he is a bit irritable with his family.  She returns after 3 months. He continues to do well. He is working very hard and often takes an extra hours at work. He likes to stay busy. His mood is been good and he is well focused.  He is sleeping much better with the Valium at night. He is still working on trying to get custody of his 43 year old son who lives in [place]   History of Chief Complaint: Pt first sought help at age 89 and has been in treatment pretty much ever since then.   He has been identified as LD and never graduated from HS.  He kept getting into trouble and getting kicked out of schools.  He has struggled with anxiety and depression and tried on several medications listed below.  He  found the best benefit from Adderall.  He comes seeking other meds that don't cost $130 a month.  Since starting Adderall he has not been in trouble with the law.   Anxiety     see above  Review of Systems Neuro: no headaches, ataxia, weakness GI: no N/V/D/cramps/constipation MS: no weakness, muscle cramps, aches. He notes some joint aches and could use chrondrotin sulfate with glucosamine.  Physical Exam Vitals: BP 130/80  Ht 5\' 10"  (1.778 m)  Wt 202 lb (91.627 kg)  BMI 28.98 kg/m2   Depressive Symptoms: depressed mood, insomnia, difficulty concentrating, suicidal thoughts without plan, anxiety, panic attacks,  (Hypo) Manic Symptoms:   Elevated Mood:  Negative Irritable Mood:  Yes Grandiosity:  history of Distractibility:  Yes Labiality of Mood:  Yes Delusions:  Yes Hallucinations:  Yes Impulsivity:  Yes Sexually Inappropriate Behavior:  No Financial Extravagance:  history of Flight of Ideas:  No  Anxiety Symptoms: Excessive Worry:  Yes Panic Symptoms:  Yes Agoraphobia:  Yes Obsessive Compulsive: Yes  Symptoms: having things in perfect order and exactly right Specific Phobias:  No Social Anxiety:  Yes  Psychotic Symptoms:  Hallucinations: Yes Visual Delusions:  No Paranoia:  Yes   Ideas of Reference:  No  PTSD Symptoms: Ever had a traumatic exposure:  Yes Had a traumatic exposure in the last  month:  No Re-experiencing: Yes Flashbacks Hypervigilance:  No Hyperarousal: No Difficulty Concentrating Avoidance: Yes Decreased Interest/Participation  Traumatic Brain Injury: Yes Blunt Trauma History of Loss of Consciousness:  Yes Seizure History:  No Cardiac History:  No  Past Psychiatric History: Diagnosis: ADHD, Bipolar disorder  Hospitalizations: adult twice  Outpatient Care: since age 35  Substance Abuse Care: adult twice  Self-Mutilation: none  Suicidal Attempts: none  Violent Behaviors: in the past   Allergies: Allergies  Allergen Reactions   . Lithium Other (See Comments)    Bizarre behavior at night, climbing out the window and bringing mail into the bedroom  . Tegretol [Carbamazepine] Other (See Comments)    Way out there VIVID dreams   Medical History: Past Medical History  Diagnosis Date  . ADHD (attention deficit hyperactivity disorder)   . Anxiety   . Bipolar disorder   . Depression   . Obsessive-compulsive disorder   . Psychosis   . Right sciatic nerve pain    Surgical History: Past Surgical History  Procedure Laterality Date  . Hand tendon surgery  10/13/1996   Family History: family history includes Alcohol abuse in his father; Anxiety disorder in his father; Dementia in his maternal grandmother. There is no history of OCD, ADD / ADHD, Bipolar disorder, Depression, Drug abuse, Paranoid behavior, Schizophrenia, Seizures, Sexual abuse, or Physical abuse. Reviewed and nothing new is noted today.  Current Medications:  Current Outpatient Prescriptions  Medication Sig Dispense Refill  . amphetamine-dextroamphetamine (ADDERALL) 30 MG tablet Take 1 tablet (30 mg total) by mouth 3 (three) times daily.  90 tablet  0  . amphetamine-dextroamphetamine (ADDERALL) 30 MG tablet Take 1 tablet (30 mg total) by mouth 3 (three) times daily.  90 tablet  0  . amphetamine-dextroamphetamine (ADDERALL) 30 MG tablet Take 1 tablet (30 mg total) by mouth 3 (three) times daily.  90 tablet  0  . diazepam (VALIUM) 10 MG tablet Take 1 tablet (10 mg total) by mouth at bedtime as needed for anxiety.  30 tablet  2   No current facility-administered medications for this visit.    Previous Psychotropic Medications: Medication Dose   Paxil   Vistaril   Zoloft   Lamictal   Depakote   Wellbutrin   Klonopin   Adderall   Valium   Ritalin   Remeron   Clonidine/Kapvay   Cymbalta   Neurontin   Effexor    Substance Abuse History in the last 12 months: Substance Age of 1st Use Last Use Amount Specific Type  Nicotine  13  2 weeks  dip     Alcohol  14  yesterday  two 12 oz  beer  Cannabis  14  years      Opiates  none        Cocaine  [age]      Methamphetamines  none        LSD  17  18      Ecstasy  none         Benzodiazepines  30's  last night  5mg   Valium  Caffeine  childhood  today  swallow  soft drink  Inhalants  none        Others:      Sugar  childhood  today    Medical Consequences of Substance Abuse: none Legal Consequences of Substance Abuse: none Family Consequences of Substance Abuse: none Blackouts:  No DT's:  No Withdrawal Symptoms:  No   Social History: Current Place of Residence:  [address] [place] [address] Place of Birth: [place] Family Members: wife step son Marital Status:  Married Children: 3  Sons: 2  Daughters: 1 Relationships: wife Education:  dropped out in 11 th grade Educational Problems/Performance: LD and ADHD Religious Beliefs/Practices: none History of Abuse: none Occupational Experiences; none Military History:  Writer History: none Hobbies/Interests: fishing, watching sports, walking outside  Mental Status Examination/Evaluation: Objective:  Appearance: Casual  Eye Contact::  Good  Speech:  Clear and Coherent  Volume:  Normal  Mood:  Good today   Affect:  Congruent  Thought Process:  Coherent, Intact and Logical  Orientation:  Full (Time, Place, and Person)  Thought Content:  no  Suicidal Thoughts:  No  Homicidal Thoughts:  No  Judgement:  Fair  Insight:  Fair  Psychomotor Activity:  Increased and Restlessness  Akathisia:  No  Handed:  Right  AIMS (if indicated):    Assets:  Communication Skills Desire for Improvement   Lab Results: No results found for this or any previous visit (from the past 2016 hour(s)).  Assessment:   AXIS I ADHD, inattentive type, Generalized Anxiety Disorder, Obsessive Compulsive Disorder, Panic Disorder and Tourette Syndrome  AXIS II Deferred  AXIS III Past Medical History  Diagnosis Date  . ADHD (attention  deficit hyperactivity disorder)   . Anxiety   . Bipolar disorder   . Depression   . Obsessive-compulsive disorder   . Psychosis   . Right sciatic nerve pain      AXIS IV other psychosocial or environmental problems  AXIS V 61-70 mild symptoms   Therapeutic Plan: Psychotherapy: problem solving  Medications: Adderall 30 mg 3 times a day, Valium 10 mg each bedtime as needed   Routine PRN Medications:  Negative  Consultations:   Safety Concerns:  none  Other:     Plan/Discussion: I took his vitals.  I reviewed CC, tobacco/med/surg Hx, meds effects/ side effects, problem list, therapies and responses as well as current situation/symptoms discussed options. He will continue his current dose of Adderall. He will be prescribed Valium 10 mg each bedtime as needed and return in 3 months See orders and pt instructions for more details.  MEDICATIONS this encounter: Meds ordered this encounter  Medications  . diazepam (VALIUM) 10 MG tablet    Sig: Take 1 tablet (10 mg total) by mouth at bedtime as needed for anxiety.    Dispense:  30 tablet    Refill:  2  . amphetamine-dextroamphetamine (ADDERALL) 30 MG tablet    Sig: Take 1 tablet (30 mg total) by mouth 3 (three) times daily.    Dispense:  90 tablet    Refill:  0  . amphetamine-dextroamphetamine (ADDERALL) 30 MG tablet    Sig: Take 1 tablet (30 mg total) by mouth 3 (three) times daily.    Dispense:  90 tablet    Refill:  0    Do not fill until 04/23/14  . amphetamine-dextroamphetamine (ADDERALL) 30 MG tablet    Sig: Take 1 tablet (30 mg total) by mouth 3 (three) times daily.    Dispense:  90 tablet    Refill:  0    Do not fill before 05/24/14    Medical Decision Making Problem Points:  Established problem, stable/improving (1), Established problem, worsening (2), Review of last therapy session (1) and Review of psycho-social stressors (1) Data Points:  Review or order clinical lab tests (1) Review of medication regiment & side  effects (2) Review of new  medications or change in dosage (2)  I certify that outpatient services furnished can reasonably be expected to improve the patient's condition.   Diannia RuderOSS, DEBORAH, MD

## 2014-03-29 ENCOUNTER — Ambulatory Visit (HOSPITAL_COMMUNITY): Payer: Self-pay | Admitting: Psychiatry

## 2014-06-23 ENCOUNTER — Ambulatory Visit (INDEPENDENT_AMBULATORY_CARE_PROVIDER_SITE_OTHER): Payer: 59 | Admitting: Psychiatry

## 2014-06-23 ENCOUNTER — Encounter (HOSPITAL_COMMUNITY): Payer: Self-pay | Admitting: Psychiatry

## 2014-06-23 VITALS — BP 152/85 | HR 75 | Ht 70.0 in | Wt 198.6 lb

## 2014-06-23 DIAGNOSIS — F988 Other specified behavioral and emotional disorders with onset usually occurring in childhood and adolescence: Secondary | ICD-10-CM

## 2014-06-23 DIAGNOSIS — F410 Panic disorder [episodic paroxysmal anxiety] without agoraphobia: Secondary | ICD-10-CM

## 2014-06-23 DIAGNOSIS — F429 Obsessive-compulsive disorder, unspecified: Secondary | ICD-10-CM

## 2014-06-23 DIAGNOSIS — F902 Attention-deficit hyperactivity disorder, combined type: Secondary | ICD-10-CM

## 2014-06-23 DIAGNOSIS — F411 Generalized anxiety disorder: Secondary | ICD-10-CM

## 2014-06-23 DIAGNOSIS — F952 Tourette's disorder: Secondary | ICD-10-CM

## 2014-06-23 MED ORDER — AMPHETAMINE-DEXTROAMPHETAMINE 30 MG PO TABS: 30.0000 mg | ORAL_TABLET | Freq: Three times a day (TID) | ORAL | Status: DC

## 2014-06-23 MED ORDER — DIAZEPAM 10 MG PO TABS: 10.0000 mg | ORAL_TABLET | Freq: Every evening | ORAL | Status: DC | PRN

## 2014-06-23 NOTE — Progress Notes (Signed)
Patient ID: Todd Gilbert, male   DOB: 1971/08/27, 43 y.o.   MRN: [id number] Patient ID: Todd Gilbert, male   DOB: Jan 28, 1971, 43 y.o.   MRN: [id number] Patient ID: Todd Gilbert, male   DOB: 1971-04-27, 43 y.o.   MRN: [id number] Patient ID: Todd Gilbert, male   DOB: [birth year], 43 y.o.   MRN: [id number] Patient ID: Todd Gilbert, male   DOB: 04-Jul-1971, 43 y.o.   MRN: [id number] [facility] [id number] Progress Note Todd Gilbert MRN: [id number] DOB: 1970/10/25 Age: 43 y.o.  Date: 06/23/2014 Start Time: 9:05 AM End Time: 9:30 AM  Chief Complaint: Chief Complaint  Patient presents with  . Anxiety  . ADHD  . Follow-up   Subjective: "I'm doing well."  This patient is a 43 year old married white male lives with his wife in [place]. He has 3 children who live with his ex-wife in [place]. He works for a Administrator, arts.  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 He was treated with Ritalin. He was "wild" as a teenager and young adult and dropped out of high school. He was in jail numerous times and used to drink and use drugs. Since he's been on Adderall he's been doing much better. He is much less impulsive and he able to stay focused at work. He is eating and sleeping well and doesn't endorse any symptoms of depression or anxiety. At times he is a bit irritable with his family.  She returns after 3 months. He continues to do well. He is working very hard and often takes an extra hours at work. He likes to stay busy. His mood is been good and he is well focused.  He is sleeping much better with the Valium at night. He has not gotten custody of his son but is allowed to see on weekends.   History of Chief Complaint: Pt first sought help at age 43 and has been in treatment pretty much ever since then.   He has been identified as LD and never graduated from HS.  He kept getting into trouble and getting kicked out of schools.  He has struggled with anxiety and  depression and tried on several medications listed below.  He found the best benefit from Adderall.  He comes seeking other meds that don't cost $130 a month.  Since starting Adderall he has not been in trouble with the law.   Anxiety     see above  Review of Systems Neuro: no headaches, ataxia, weakness GI: no N/V/D/cramps/constipation MS: no weakness, muscle cramps, aches. He notes some joint aches and could use chrondrotin sulfate with glucosamine.  Physical Exam Vitals: BP 152/85  Pulse 75  Ht  (1.778 m)  Wt 198 lb 9.6 oz (90.084 kg)  BMI 28.50 kg/m2   Depressive Symptoms: depressed mood, insomnia, difficulty concentrating, suicidal thoughts without plan, anxiety, panic attacks,  (Hypo) Manic Symptoms:   Elevated Mood:  Negative Irritable Mood:  Yes Grandiosity:  history of Distractibility:  Yes Labiality of Mood:  Yes Delusions:  Yes Hallucinations:  Yes Impulsivity:  Yes Sexually Inappropriate Behavior:  No Financial Extravagance:  history of Flight of Ideas:  No  Anxiety Symptoms: Excessive Worry:  Yes Panic Symptoms:  Yes Agoraphobia:  Yes Obsessive Compulsive: Yes  Symptoms: having things in perfect order and exactly right Specific Phobias:  No Social Anxiety:  Yes  Psychotic Symptoms:  Hallucinations: Yes Visual Delusions:  No Paranoia:  Yes   Ideas of Reference:  No  PTSD Symptoms: Ever had a traumatic exposure:  Yes Had a traumatic exposure in the last month:  No Re-experiencing: Yes Flashbacks Hypervigilance:  No Hyperarousal: No Difficulty Concentrating Avoidance: Yes Decreased Interest/Participation  Traumatic Brain Injury: Yes Blunt Trauma History of Loss of Consciousness:  Yes Seizure History:  No Cardiac History:  No  Past Psychiatric History: Diagnosis: ADHD, Bipolar disorder  Hospitalizations: adult twice  Outpatient Care: since age 43  Substance Abuse Care: adult twice  Self-Mutilation: none  Suicidal Attempts: none   Violent Behaviors: in the past   Allergies: Allergies  Allergen Reactions  . Lithium Other (See Comments)    Bizarre behavior at night, climbing out the window and bringing mail into the bedroom  . Tegretol [Carbamazepine] Other (See Comments)    Way out there VIVID dreams   Medical History: Past Medical History  Diagnosis Date  . ADHD (attention deficit hyperactivity disorder)   . Anxiety   . Bipolar disorder   . Depression   . Obsessive-compulsive disorder   . Psychosis   . Right sciatic nerve pain    Surgical History: Past Surgical History  Procedure Laterality Date  . Hand tendon surgery  10/13/1996   Family History: family history includes Alcohol abuse in his father; Anxiety disorder in his father; Dementia in his maternal grandmother. There is no history of OCD, ADD / ADHD, Bipolar disorder, Depression, Drug abuse, Paranoid behavior, Schizophrenia, Seizures, Sexual abuse, or Physical abuse. Reviewed and nothing new is noted today.  Current Medications:  Current Outpatient Prescriptions  Medication Sig Dispense Refill  . amphetamine-dextroamphetamine (ADDERALL) 30 MG tablet Take 1 tablet by mouth 3 (three) times daily.  90 tablet  0  . diazepam (VALIUM) 10 MG tablet Take 1 tablet (10 mg total) by mouth at bedtime as needed for anxiety.  30 tablet  2  . amphetamine-dextroamphetamine (ADDERALL) 30 MG tablet Take 1 tablet by mouth 3 (three) times daily.  90 tablet  0  . amphetamine-dextroamphetamine (ADDERALL) 30 MG tablet Take 1 tablet by mouth 3 (three) times daily.  90 tablet  0   No current facility-administered medications for this visit.    Previous Psychotropic Medications: Medication Dose   Paxil   Vistaril   Zoloft   Lamictal   Depakote   Wellbutrin   Klonopin   Adderall   Valium   Ritalin   Remeron   Clonidine/Kapvay   Cymbalta   Neurontin   Effexor    Substance Abuse History in the last 12 months: Substance Age of 1st Use Last Use Amount  Specific Type  Nicotine  13  2 weeks  dip    Alcohol  14  yesterday  two 12 oz  beer  Cannabis  14  years      Opiates  none        Cocaine  [age]      Methamphetamines  none        LSD  17  18      Ecstasy  none         Benzodiazepines  30's  last night    Valium  Caffeine  childhood  today  swallow  soft drink  Inhalants  none        Others:      Sugar  childhood  today    Medical Consequences of Substance Abuse: none Legal Consequences of Substance Abuse: none Family Consequences of Substance Abuse: none Blackouts:  No DT's:  No Withdrawal Symptoms:  No  Social History: Current Place of Residence: [address] [place] [address] Place of Birth: [place] Family Members: wife step son Marital Status:  Married Children: 3  Sons: 2  Daughters: 1 Relationships: wife Education:  dropped out in 11 th grade Educational Problems/Performance: LD and ADHD Religious Beliefs/Practices: none History of Abuse: none Occupational Experiences; none Military History:  Writer History: none Hobbies/Interests: fishing, watching sports, walking outside  Mental Status Examination/Evaluation: Objective:  Appearance: Casual  Eye Contact::  Good  Speech:  Clear and Coherent  Volume:  Normal  Mood:  Good today   Affect:  Congruent  Thought Process:  Coherent, Intact and Logical  Orientation:  Full (Time, Place, and Person)  Thought Content:  no  Suicidal Thoughts:  No  Homicidal Thoughts:  No  Judgement:  Fair  Insight:  Fair  Psychomotor Activity:  Increased and Restlessness  Akathisia:  No  Handed:  Right  AIMS (if indicated):    Assets:  Communication Skills Desire for Improvement   Lab Results: No results found for this or any previous visit (from the past 2016 hour(s)).  Assessment:   AXIS I ADHD, inattentive type, Generalized Anxiety Disorder, Obsessive Compulsive Disorder, Panic Disorder and Tourette Syndrome  AXIS II Deferred  AXIS III Past Medical  History  Diagnosis Date  . ADHD (attention deficit hyperactivity disorder)   . Anxiety   . Bipolar disorder   . Depression   . Obsessive-compulsive disorder   . Psychosis   . Right sciatic nerve pain      AXIS IV other psychosocial or environmental problems  AXIS V 61-70 mild symptoms   Therapeutic Plan: Psychotherapy: problem solving  Medications: Adderall 30 mg 3 times a day, Valium 10 mg each bedtime as needed   Routine PRN Medications:  Negative  Consultations:   Safety Concerns:  none  Other:     Plan/Discussion: I took his vitals.  I reviewed CC, tobacco/med/surg Hx, meds effects/ side effects, problem list, therapies and responses as well as current situation/symptoms discussed options. He will continue his current dose of Adderall. He will be prescribed Valium 10 mg each bedtime as needed and return in 3 months See orders and pt instructions for more details.  MEDICATIONS this encounter: Meds ordered this encounter  Medications  . amphetamine-dextroamphetamine (ADDERALL) 30 MG tablet    Sig: Take 1 tablet by mouth 3 (three) times daily.    Dispense:  90 tablet    Refill:  0  . amphetamine-dextroamphetamine (ADDERALL) 30 MG tablet    Sig: Take 1 tablet by mouth 3 (three) times daily.    Dispense:  90 tablet    Refill:  0    Do not fill until 07/23/14  . amphetamine-dextroamphetamine (ADDERALL) 30 MG tablet    Sig: Take 1 tablet by mouth 3 (three) times daily.    Dispense:  90 tablet    Refill:  0    Do not fill before 08/23/14  . diazepam (VALIUM) 10 MG tablet    Sig: Take 1 tablet (10 mg total) by mouth at bedtime as needed for anxiety.    Dispense:  30 tablet    Refill:  2    Medical Decision Making Problem Points:  Established problem, stable/improving (1), Established problem, worsening (2), Review of last therapy session (1) and Review of psycho-social stressors (1) Data Points:  Review or order clinical lab tests (1) Review of medication regiment &  side effects (2) Review of new medications or change  in dosage (2)  I certify that outpatient services furnished can reasonably be expected to improve the patient's condition.   [name], MD

## 2014-09-22 ENCOUNTER — Ambulatory Visit (INDEPENDENT_AMBULATORY_CARE_PROVIDER_SITE_OTHER): Payer: 59 | Admitting: Psychiatry

## 2014-09-22 ENCOUNTER — Encounter (HOSPITAL_COMMUNITY): Payer: Self-pay | Admitting: Psychiatry

## 2014-09-22 VITALS — BP 140/100 | Ht 70.0 in | Wt 200.0 lb

## 2014-09-22 DIAGNOSIS — F410 Panic disorder [episodic paroxysmal anxiety] without agoraphobia: Secondary | ICD-10-CM

## 2014-09-22 DIAGNOSIS — F42 Obsessive-compulsive disorder: Secondary | ICD-10-CM

## 2014-09-22 DIAGNOSIS — F952 Tourette's disorder: Secondary | ICD-10-CM

## 2014-09-22 DIAGNOSIS — F902 Attention-deficit hyperactivity disorder, combined type: Secondary | ICD-10-CM

## 2014-09-22 DIAGNOSIS — F900 Attention-deficit hyperactivity disorder, predominantly inattentive type: Secondary | ICD-10-CM

## 2014-09-22 DIAGNOSIS — F411 Generalized anxiety disorder: Secondary | ICD-10-CM

## 2014-09-22 MED ORDER — AMPHETAMINE-DEXTROAMPHETAMINE 30 MG PO TABS: 30.0000 mg | ORAL_TABLET | Freq: Three times a day (TID) | ORAL | Status: DC

## 2014-09-22 MED ORDER — DIAZEPAM 10 MG PO TABS: 10.0000 mg | ORAL_TABLET | Freq: Every evening | ORAL | Status: DC | PRN

## 2014-09-22 NOTE — Progress Notes (Signed)
Patient ID: Todd Gilbert, male   DOB: 1971/02/25, 43 y.o.   MRN: [id number] Patient ID: Todd Gilbert, male   DOB: [birth year], 43 y.o.   MRN: [id number] Patient ID: Todd Gilbert, male   DOB: March 19, 1971, 43 y.o.   MRN: [id number] Patient ID: Todd Gilbert, male   DOB: 1971-06-15, 43 y.o.   MRN: [id number] Patient ID: Todd Gilbert, male   DOB: 1970-11-17, 43 y.o.   MRN: [id number] Patient ID: Todd Gilbert, male   DOB: 1971-08-04, 43 y.o.   MRN: [id number] [facility] [id number] Progress Note Todd Gilbert MRN: [id number] DOB: 1970-12-25 Age: 43 y.o.  Date: 09/22/2014 Start Time: 9:05 AM End Time: 9:30 AM  Chief Complaint: Chief Complaint  Patient presents with  . ADHD  . Follow-up   Subjective: "I'm doing well."  This patient is a 43 year old married white male lives with his wife in [place]. He has 3 children who live with his ex-wife in [place]. He works for a Administrator, arts.  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 He was treated with Ritalin. He was "wild" as a teenager and young adult and dropped out of high school. He was in jail numerous times and used to drink and use drugs. Since he's been on Adderall he's been doing much better. He is much less impulsive and he able to stay focused at work. He is eating and sleeping well and doesn't endorse any symptoms of depression or anxiety. At times he is a bit irritable with his family.  he returns after 3 months. He continues to do well. He is working very hard and often takes an extra hours at work. He likes to stay busy. His blood pressure was high today at 140/192 and we had to keep a close eye on this because Adderall can raise blood pressure. His wife is a Engineer, civil (consulting) and he is going to have her check it several times a week. He's doing very well and has not relapsed into using drugs and alcohol but does drink an occasional beer. He is sleeping well with the Valium.   History of Chief Complaint: Pt first  sought help at age 17 and has been in treatment pretty much ever since then.   He has been identified as LD and never graduated from HS.  He kept getting into trouble and getting kicked out of schools.  He has struggled with anxiety and depression and tried on several medications listed below.  He found the best benefit from Adderall.  He comes seeking other meds that don't cost $130 a month.  Since starting Adderall he has not been in trouble with the law.   Anxiety     see above  Review of Systems Neuro: no headaches, ataxia, weakness GI: no N/V/D/cramps/constipation MS: no weakness, muscle cramps, aches. He notes some joint aches and could use chrondrotin sulfate with glucosamine.  Physical Exam Vitals: BP 140/100 mmHg  Ht 5\' 10"  (1.778 m)  Wt 200 lb (90.719 kg)  BMI 28.70 kg/m2   Depressive Symptoms: depressed mood, insomnia, difficulty concentrating, suicidal thoughts without plan, anxiety, panic attacks,  (Hypo) Manic Symptoms:   Elevated Mood:  Negative Irritable Mood:  Yes Grandiosity:  history of Distractibility:  Yes Labiality of Mood:  Yes Delusions:  Yes Hallucinations:  Yes Impulsivity:  Yes Sexually Inappropriate Behavior:  No Financial Extravagance:  history of Flight of Ideas:  No  Anxiety Symptoms: Excessive Worry:  Yes Panic Symptoms:  Yes Agoraphobia:  Yes Obsessive  Compulsive: Yes  Symptoms: having things in perfect order and exactly right Specific Phobias:  No Social Anxiety:  Yes  Psychotic Symptoms:  Hallucinations: Yes Visual Delusions:  No Paranoia:  Yes   Ideas of Reference:  No  PTSD Symptoms: Ever had a traumatic exposure:  Yes Had a traumatic exposure in the last month:  No Re-experiencing: Yes Flashbacks Hypervigilance:  No Hyperarousal: No Difficulty Concentrating Avoidance: Yes Decreased Interest/Participation  Traumatic Brain Injury: Yes Blunt Trauma History of Loss of Consciousness:  Yes Seizure History:  No Cardiac  History:  No  Past Psychiatric History: Diagnosis: ADHD, Bipolar disorder  Hospitalizations: adult twice  Outpatient Care: since age 89  Substance Abuse Care: adult twice  Self-Mutilation: none  Suicidal Attempts: none  Violent Behaviors: in the past   Allergies: Allergies  Allergen Reactions  . Lithium Other (See Comments)    Bizarre behavior at night, climbing out the window and bringing mail into the bedroom  . Tegretol [Carbamazepine] Other (See Comments)    Way out there VIVID dreams   Medical History: Past Medical History  Diagnosis Date  . ADHD (attention deficit hyperactivity disorder)   . Anxiety   . Bipolar disorder   . Depression   . Obsessive-compulsive disorder   . Psychosis   . Right sciatic nerve pain    Surgical History: Past Surgical History  Procedure Laterality Date  . Hand tendon surgery  10/13/1996   Family History: family history includes Alcohol abuse in his father; Anxiety disorder in his father; Dementia in his maternal grandmother. There is no history of OCD, ADD / ADHD, Bipolar disorder, Depression, Drug abuse, Paranoid behavior, Schizophrenia, Seizures, Sexual abuse, or Physical abuse. Reviewed and nothing new is noted today.  Current Medications:  Current Outpatient Prescriptions  Medication Sig Dispense Refill  . amphetamine-dextroamphetamine (ADDERALL) 30 MG tablet Take 1 tablet by mouth 3 (three) times daily. 90 tablet 0  . amphetamine-dextroamphetamine (ADDERALL) 30 MG tablet Take 1 tablet by mouth 3 (three) times daily. 90 tablet 0  . amphetamine-dextroamphetamine (ADDERALL) 30 MG tablet Take 1 tablet by mouth 3 (three) times daily. 90 tablet 0  . diazepam (VALIUM) 10 MG tablet Take 1 tablet (10 mg total) by mouth at bedtime as needed for anxiety. 30 tablet 2   No current facility-administered medications for this visit.    Previous Psychotropic Medications: Medication Dose   Paxil   Vistaril   Zoloft   Lamictal   Depakote    Wellbutrin   Klonopin   Adderall   Valium   Ritalin   Remeron   Clonidine/Kapvay   Cymbalta   Neurontin   Effexor    Substance Abuse History in the last 12 months: Substance Age of 1st Use Last Use Amount Specific Type  Nicotine  13  2 weeks  dip    Alcohol  14  yesterday  two 12 oz  beer  Cannabis  14  years      Opiates  none        Cocaine  18  31      Methamphetamines  none        LSD  17  18      Ecstasy  none         Benzodiazepines  30's  last night  5mg   Valium  Caffeine  childhood  today  swallow  soft drink  Inhalants  none        Others:      Sugar  childhood  today    Medical Consequences of Substance Abuse: none Legal Consequences of Substance Abuse: none Family Consequences of Substance Abuse: none Blackouts:  No DT's:  No Withdrawal Symptoms:  No   Social History: Current Place of Residence: [address] [place] [address] Place of Birth: [place] Family Members: wife step son Marital Status:  Married Children: 3  Sons: 2  Daughters: 1 Relationships: wife Education:  dropped out in 11 th grade Educational Problems/Performance: LD and ADHD Religious Beliefs/Practices: none History of Abuse: none Occupational Experiences; none Military History:  Writer History: none Hobbies/Interests: fishing, watching sports, walking outside  Mental Status Examination/Evaluation: Objective:  Appearance: Casual  Eye Contact::  Good  Speech:  Clear and Coherent  Volume:  Normal  Mood:  Good today   Affect:  Congruent  Thought Process:  Coherent, Intact and Logical  Orientation:  Full (Time, Place, and Person)  Thought Content:  no  Suicidal Thoughts:  No  Homicidal Thoughts:  No  Judgement:  Fair  Insight:  Fair  Psychomotor Activity:  Increased and Restlessness  Akathisia:  No  Handed:  Right  AIMS (if indicated):    Assets:  Communication Skills Desire for Improvement   Lab Results: No results found for this or any previous visit  (from the past 2016 hour(s)).  Assessment:   AXIS I ADHD, inattentive type, Generalized Anxiety Disorder, Obsessive Compulsive Disorder, Panic Disorder and Tourette Syndrome  AXIS II Deferred  AXIS III Past Medical History  Diagnosis Date  . ADHD (attention deficit hyperactivity disorder)   . Anxiety   . Bipolar disorder   . Depression   . Obsessive-compulsive disorder   . Psychosis   . Right sciatic nerve pain      AXIS IV other psychosocial or environmental problems  AXIS V 61-70 mild symptoms   Therapeutic Plan: Psychotherapy: problem solving  Medications: Adderall 30 mg 3 times a day, Valium 10 mg each bedtime as needed   Routine PRN Medications:  Negative  Consultations:   Safety Concerns:  none  Other:     Plan/Discussion: I took his vitals.  I reviewed CC, tobacco/med/surg Hx, meds effects/ side effects, problem list, therapies and responses as well as current situation/symptoms discussed options. He will continue his current dose of Adderall. He will be prescribed Valium 10 mg each bedtime as needed and return in 3 months. He promises to keep a close eye and his blood pressure and contact his primary doctor, Dr. Dwana Melena if it is persistently elevated See orders and pt instructions for more details.  MEDICATIONS this encounter: Meds ordered this encounter  Medications  . amphetamine-dextroamphetamine (ADDERALL) 30 MG tablet    Sig: Take 1 tablet by mouth 3 (three) times daily.    Dispense:  90 tablet    Refill:  0  . amphetamine-dextroamphetamine (ADDERALL) 30 MG tablet    Sig: Take 1 tablet by mouth 3 (three) times daily.    Dispense:  90 tablet    Refill:  0    Do not fill until 10/23/14  . amphetamine-dextroamphetamine (ADDERALL) 30 MG tablet    Sig: Take 1 tablet by mouth 3 (three) times daily.    Dispense:  90 tablet    Refill:  0    Do not fill before 11/23/14  . diazepam (VALIUM) 10 MG tablet    Sig: Take 1 tablet (10 mg total) by mouth at bedtime as  needed for anxiety.    Dispense:  30 tablet  Refill:  2    Medical Decision Making Problem Points:  Established problem, stable/improving (1), Established problem, worsening (2), Review of last therapy session (1) and Review of psycho-social stressors (1) Data Points:  Review or order clinical lab tests (1) Review of medication regiment & side effects (2) Review of new medications or change in dosage (2)  I certify that outpatient services furnished can reasonably be expected to improve the patient's condition.   Diannia RuderOSS, DEBORAH, MD

## 2014-12-22 ENCOUNTER — Encounter (HOSPITAL_COMMUNITY): Payer: Self-pay | Admitting: Psychiatry

## 2014-12-22 ENCOUNTER — Ambulatory Visit (INDEPENDENT_AMBULATORY_CARE_PROVIDER_SITE_OTHER): Payer: 59 | Admitting: Psychiatry

## 2014-12-22 VITALS — BP 144/87 | HR 60 | Ht 70.0 in | Wt 207.0 lb

## 2014-12-22 DIAGNOSIS — F411 Generalized anxiety disorder: Secondary | ICD-10-CM

## 2014-12-22 DIAGNOSIS — F902 Attention-deficit hyperactivity disorder, combined type: Secondary | ICD-10-CM

## 2014-12-22 DIAGNOSIS — F952 Tourette's disorder: Secondary | ICD-10-CM

## 2014-12-22 DIAGNOSIS — F410 Panic disorder [episodic paroxysmal anxiety] without agoraphobia: Secondary | ICD-10-CM

## 2014-12-22 DIAGNOSIS — F42 Obsessive-compulsive disorder: Secondary | ICD-10-CM

## 2014-12-22 MED ORDER — AMPHETAMINE-DEXTROAMPHETAMINE 30 MG PO TABS: 30.0000 mg | ORAL_TABLET | Freq: Three times a day (TID) | ORAL | Status: DC

## 2014-12-22 MED ORDER — DIAZEPAM 10 MG PO TABS: 10.0000 mg | ORAL_TABLET | Freq: Every evening | ORAL | Status: DC | PRN

## 2014-12-22 NOTE — Progress Notes (Signed)
Patient ID: JAILEN COWARD, male   DOB: [birth year], 44 y.o.   MRN: [id number] Patient ID: TOU HAYNER, male   DOB: 07/23/1971, 44 y.o.   MRN: [id number] Patient ID: WYNDELL CARDIFF, male   DOB: 1971/06/07, 44 y.o.   MRN: [id number] Patient ID: JB DULWORTH, male   DOB: July 20, 1971, 44 y.o.   MRN: [id number] Patient ID: EUTIMIO GHARIBIAN, male   DOB: 01/14/1971, 44 y.o.   MRN: [id number] Patient ID: DAVINCI GLOTFELTY, male   DOB: 02/28/1971, 44 y.o.   MRN: [id number] Patient ID: SHERMAN DONALDSON, male   DOB: 1970-10-14, 44 y.o.   MRN: [id number] [facility] [id number] Progress Note GRACYN SANTILLANES MRN: [id number] DOB: July 08, 1971 Age: 44 y.o.  Date: 12/22/2014 Start Time: 9:05 AM End Time: 9:30 AM  Chief Complaint: Chief Complaint  Patient presents with  . ADHD  . Follow-up   Subjective: "I'm doing well."  This patient is a 44 year old married white male lives with his wife in [place]. He has 3 children who live with his ex-wife in [place]. He works for a Administrator, arts.  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 He was treated with Ritalin. He was "wild" as a teenager and young adult and dropped out of high school. He was in jail numerous times and used to drink and use drugs. Since he's been on Adderall he's been doing much better. He is much less impulsive and he able to stay focused at work. He is eating and sleeping well and doesn't endorse any symptoms of depression or anxiety. At times he is a bit irritable with his family.  he returns after 3 months. He continues to do well. He is working very hard and often takes an extra hours at work. He likes to stay busy. His blood pressure was better today. That Valium is helping him sleep. He does not have any specific complaints   History of Chief Complaint: Pt first sought help at age 28 and has been in treatment pretty much ever since then.   He has been identified as LD and never graduated from HS.  He kept  getting into trouble and getting kicked out of schools.  He has struggled with anxiety and depression and tried on several medications listed below.  He found the best benefit from Adderall.  He comes seeking other meds that don't cost $130 a month.  Since starting Adderall he has not been in trouble with the law.   Anxiety     see above  Review of Systems Neuro: no headaches, ataxia, weakness GI: no N/V/D/cramps/constipation MS: no weakness, muscle cramps, aches. He notes some joint aches and could use chrondrotin sulfate with glucosamine.  Physical Exam Vitals: BP 144/87 mmHg  Pulse 60  Ht 5\' 10"  (1.778 m)  Wt 207 lb (93.895 kg)  BMI 29.70 kg/m2   Depressive Symptoms: depressed mood, insomnia, difficulty concentrating, suicidal thoughts without plan, anxiety, panic attacks,  (Hypo) Manic Symptoms:   Elevated Mood:  Negative Irritable Mood:  Yes Grandiosity:  history of Distractibility:  Yes Labiality of Mood:  Yes Delusions:  Yes Hallucinations:  Yes Impulsivity:  Yes Sexually Inappropriate Behavior:  No Financial Extravagance:  history of Flight of Ideas:  No  Anxiety Symptoms: Excessive Worry:  Yes Panic Symptoms:  Yes Agoraphobia:  Yes Obsessive Compulsive: Yes  Symptoms: having things in perfect order and exactly right Specific Phobias:  No Social Anxiety:  Yes  Psychotic Symptoms:  Hallucinations: Yes Visual Delusions:  No Paranoia:  Yes   Ideas of Reference:  No  PTSD Symptoms: Ever had a traumatic exposure:  Yes Had a traumatic exposure in the last month:  No Re-experiencing: Yes Flashbacks Hypervigilance:  No Hyperarousal: No Difficulty Concentrating Avoidance: Yes Decreased Interest/Participation  Traumatic Brain Injury: Yes Blunt Trauma History of Loss of Consciousness:  Yes Seizure History:  No Cardiac History:  No  Past Psychiatric History: Diagnosis: ADHD, Bipolar disorder  Hospitalizations: adult twice  Outpatient Care: since  age 13  Substance Abuse Care: adult twice  Self-Mutilation: none  Suicidal Attempts: none  Violent Behaviors: in the past   Allergies: Allergies  Allergen Reactions  . Lithium Other (See Comments)    Bizarre behavior at night, climbing out the window and bringing mail into the bedroom  . Tegretol [Carbamazepine] Other (See Comments)    Way out there VIVID dreams   Medical History: Past Medical History  Diagnosis Date  . ADHD (attention deficit hyperactivity disorder)   . Anxiety   . Bipolar disorder   . Depression   . Obsessive-compulsive disorder   . Psychosis   . Right sciatic nerve pain    Surgical History: Past Surgical History  Procedure Laterality Date  . Hand tendon surgery  10/13/1996   Family History: family history includes Alcohol abuse in his father; Anxiety disorder in his father; Dementia in his maternal grandmother. There is no history of OCD, ADD / ADHD, Bipolar disorder, Depression, Drug abuse, Paranoid behavior, Schizophrenia, Seizures, Sexual abuse, or Physical abuse. Reviewed and nothing new is noted today.  Current Medications:  Current Outpatient Prescriptions  Medication Sig Dispense Refill  . amphetamine-dextroamphetamine (ADDERALL) 30 MG tablet Take 1 tablet by mouth 3 (three) times daily. 90 tablet 0  . amphetamine-dextroamphetamine (ADDERALL) 30 MG tablet Take 1 tablet by mouth 3 (three) times daily. 90 tablet 0  . amphetamine-dextroamphetamine (ADDERALL) 30 MG tablet Take 1 tablet by mouth 3 (three) times daily. 90 tablet 0  . diazepam (VALIUM) 10 MG tablet Take 1 tablet (10 mg total) by mouth at bedtime as needed for anxiety. 30 tablet 2   No current facility-administered medications for this visit.    Previous Psychotropic Medications: Medication Dose   Paxil   Vistaril   Zoloft   Lamictal   Depakote   Wellbutrin   Klonopin   Adderall   Valium   Ritalin   Remeron   Clonidine/Kapvay   Cymbalta   Neurontin   Effexor    Substance  Abuse History in the last 12 months: Substance Age of 1st Use Last Use Amount Specific Type  Nicotine  13  2 weeks  dip    Alcohol  14  yesterday  two 12 oz  beer  Cannabis  14  years      Opiates  none        Cocaine  [age]      Methamphetamines  none        LSD  17  18      Ecstasy  none         Benzodiazepines  30's  last night    Valium  Caffeine  childhood  today  swallow  soft drink  Inhalants  none        Others:      Sugar  childhood  today    Medical Consequences of Substance Abuse: none Legal Consequences of Substance Abuse: none Family Consequences of Substance Abuse: none Blackouts:  No DT's:  No Withdrawal Symptoms:  No   Social History: Current Place of Residence: [address] [place] [id number] Place [place] Birth[place] Family Members: wife step son Marital Status:  Married Children: 3  Sons: 2  Daughters: 1 Relationships: wife Education:  dropped out in 11 th grade Educational Problems/Performance: LD and ADHD Religious Beliefs/Practices: none History of Abuse: none Occupational Experiences; none Military History:  [employer] Legal History: none Hobbies/Interests: fishing, watching sports, walking outside  Mental Status Examination/Evaluation: Objective:  Appearance: Casual  Eye Contact::  Good  Speech:  Clear and Coherent  Volume:  Normal  Mood:  Good today   Affect:  Congruent  Thought Process:  Coherent, Intact and Logical  Orientation:  Full (Time, Place, and Person)  Thought Content:  no  Suicidal Thoughts:  No  Homicidal Thoughts:  No  Judgement:  Fair  Insight:  Fair  Psychomotor Activity:  Increased and Restlessness  Akathisia:  No  Handed:  Right  AIMS (if indicated):    Assets:  Communication Skills Desire for Improvement   Lab Results: No results found for this or any previous visit (from the past 2016 hour(s)).  Assessment:   AXIS I ADHD, inattentive type, Generalized Anxiety Disorder, Obsessive Compulsive Disorder,  Panic Disorder and Tourette Syndrome  AXIS II Deferred  AXIS III Past Medical History  Diagnosis Date  . ADHD (attention deficit hyperactivity disorder)   . Anxiety   . Bipolar disorder   . Depression   . Obsessive-compulsive disorder   . Psychosis   . Right sciatic nerve pain      AXIS IV other psychosocial or environmental problems  AXIS V 61-70 mild symptoms   Therapeutic Plan: Psychotherapy: problem solving  Medications: Adderall 30 mg 3 times a day, Valium 10 mg each bedtime as needed   Routine PRN Medications:  Negative  Consultations:   Safety Concerns:  none  Other:     Plan/Discussion: I took his vitals.  I reviewed CC, tobacco/med/surg Hx, meds effects/ side effects, problem list, therapies and responses as well as current situation/symptoms discussed options. He will continue his current dose of Adderall. He will be prescribed Valium 10 mg each bedtime as needed and return in 3 months. He promises to keep a close eye on his blood pressure and contact his primary doctor, Dr. Dwana MelenaZack Hall if it is persistently elevated See orders and pt instructions for more details.  MEDICATIONS this encounter: Meds ordered this encounter  Medications  . amphetamine-dextroamphetamine (ADDERALL) 30 MG tablet    Sig: Take 1 tablet by mouth 3 (three) times daily.    Dispense:  90 tablet    Refill:  0  . amphetamine-dextroamphetamine (ADDERALL) 30 MG tablet    Sig: Take 1 tablet by mouth 3 (three) times daily.    Dispense:  90 tablet    Refill:  0    Do not fill until 01/22/15  . amphetamine-dextroamphetamine (ADDERALL) 30 MG tablet    Sig: Take 1 tablet by mouth 3 (three) times daily.    Dispense:  90 tablet    Refill:  0    Do not fill before 02/21/15  . diazepam (VALIUM) 10 MG tablet    Sig: Take 1 tablet (10 mg total) by mouth at bedtime as needed for anxiety.    Dispense:  30 tablet    Refill:  2    Medical Decision Making Problem Points:  Established problem,  stable/improving (1), Established problem, worsening (2), Review of  last therapy session (1) and Review of psycho-social stressors (1) Data Points:  Review or order clinical lab tests (1) Review of medication regiment & side effects (2) Review of new medications or change in dosage (2)  I certify that outpatient services furnished can reasonably be expected to improve the patient's condition.   [name], MD

## 2015-03-23 ENCOUNTER — Encounter (HOSPITAL_COMMUNITY): Payer: Self-pay | Admitting: Psychiatry

## 2015-03-23 ENCOUNTER — Ambulatory Visit (INDEPENDENT_AMBULATORY_CARE_PROVIDER_SITE_OTHER): Payer: 59 | Admitting: Psychiatry

## 2015-03-23 VITALS — BP 127/84 | HR 65 | Ht 70.0 in | Wt 202.4 lb

## 2015-03-23 DIAGNOSIS — F902 Attention-deficit hyperactivity disorder, combined type: Secondary | ICD-10-CM

## 2015-03-23 DIAGNOSIS — F42 Obsessive-compulsive disorder: Secondary | ICD-10-CM

## 2015-03-23 DIAGNOSIS — F411 Generalized anxiety disorder: Secondary | ICD-10-CM | POA: Diagnosis not present

## 2015-03-23 DIAGNOSIS — F410 Panic disorder [episodic paroxysmal anxiety] without agoraphobia: Secondary | ICD-10-CM

## 2015-03-23 DIAGNOSIS — F900 Attention-deficit hyperactivity disorder, predominantly inattentive type: Secondary | ICD-10-CM | POA: Diagnosis not present

## 2015-03-23 DIAGNOSIS — F952 Tourette's disorder: Secondary | ICD-10-CM

## 2015-03-23 MED ORDER — AMPHETAMINE-DEXTROAMPHETAMINE 30 MG PO TABS: 30.0000 mg | ORAL_TABLET | Freq: Three times a day (TID) | ORAL | Status: DC

## 2015-03-23 MED ORDER — DIAZEPAM 10 MG PO TABS: 10.0000 mg | ORAL_TABLET | Freq: Every evening | ORAL | Status: DC | PRN

## 2015-03-23 NOTE — Progress Notes (Signed)
Patient ID: Todd Gilbert, male   DOB: 05/17/1971, 44 y.o.   MRN: [id number] Patient ID: Todd Gilbert, male   DOB: 1971-07-04, 44 y.o.   MRN: [id number] Patient ID: Todd Gilbert, male   DOB: 1971-05-04, 44 y.o.   MRN: [id number] Patient ID: Todd Gilbert, male   DOB: 09/24/1971, 44 y.o.   MRN: [id number] Patient ID: Todd Gilbert, male   DOB: Dec 25, 1970, 44 y.o.   MRN: [id number] Patient ID: BRAXXTON Gilbert, male   DOB: 24-Jan-1971, 44 y.o.   MRN: [id number] Patient ID: Todd KLEM, male   DOB: 08-28-1971, 44 y.o.   MRN: [id number] Patient ID: Todd Gilbert, male   DOB: October 27, 1970, 44 y.o.   MRN: [id number] [facility] [id number] Progress Note Todd Gilbert MRN: [id number] DOB: 14-May-1971 Age: 44 y.o. y.o.  Date: 03/23/2015 Start Time: 9:05 AM End Time: 9:30 AM  Chief Complaint: Chief Complaint  Patient presents with  . ADHD  . Follow-up   Subjective: "I'Gilbert doing well."  This patient is a 44 year old married white male lives with his wife in [place]. He has 3 children who live with his ex-wife in [place]. He works for a Administrator, arts.  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 He was treated with Ritalin. He was "wild" as a teenager and young adult and dropped out of high school. He was in jail numerous times and used to drink and use drugs. Since he's been on Adderall he's been doing much better. He is much less impulsive and he able to stay focused at work. He is eating and sleeping well and doesn't endorse any symptoms of depression or anxiety. At times he is a bit irritable with his family.  he returns after 3 months. He continues to do well. He is working very hard and often takes an extra hours at work. He likes to stay busy. His blood pressure was excellent today. His mood is good and he feels like his medication for ADHD is helping him stay focused in the Valium is helping him sleep   History of Chief Complaint: Pt first sought help at  age 44 and has been in treatment pretty much ever since then.   He has been identified as LD and never graduated from HS.  He kept getting into trouble and getting kicked out of schools.  He has struggled with anxiety and depression and tried on several medications listed below.  He found the best benefit from Adderall.  He comes seeking other meds that don't cost $130 a month.  Since starting Adderall he has not been in trouble with the law.   Anxiety     see above  Review of Systems Neuro: no headaches, ataxia, weakness GI: no N/V/D/cramps/constipation MS: no weakness, muscle cramps, aches. He notes some joint aches and could use chrondrotin sulfate with glucosamine.  Physical Exam Vitals: BP 127/84 mmHg  Pulse 65  Ht 5\' 10"  (1.778 Gilbert)  Wt 202 lb 6.4 oz (91.808 kg)  BMI 29.04 kg/m2   Depressive Symptoms: depressed mood, insomnia, difficulty concentrating, suicidal thoughts without plan, anxiety, panic attacks,  (Hypo) Manic Symptoms:   Elevated Mood:  Negative Irritable Mood:  Yes Grandiosity:  history of Distractibility:  Yes Labiality of Mood:  Yes Delusions:  Yes Hallucinations:  Yes Impulsivity:  Yes Sexually Inappropriate Behavior:  No Financial Extravagance:  history of Flight of Ideas:  No  Anxiety Symptoms: Excessive Worry:  Yes Panic Symptoms:  Yes Agoraphobia:  Yes Obsessive Compulsive: Yes  Symptoms: having things in perfect order and exactly right Specific Phobias:  No Social Anxiety:  Yes  Psychotic Symptoms:  Hallucinations: Yes Visual Delusions:  No Paranoia:  Yes   Ideas of Reference:  No  PTSD Symptoms: Ever had a traumatic exposure:  Yes Had a traumatic exposure in the last month:  No Re-experiencing: Yes Flashbacks Hypervigilance:  No Hyperarousal: No Difficulty Concentrating Avoidance: Yes Decreased Interest/Participation  Traumatic Brain Injury: Yes Blunt Trauma History of Loss of Consciousness:  Yes Seizure History:  No Cardiac  History:  No  Past Psychiatric History: Diagnosis: ADHD, Bipolar disorder  Hospitalizations: adult twice  Outpatient Care: since age 44  Substance Abuse Care: adult twice  Self-Mutilation: none  Suicidal Attempts: none  Violent Behaviors: in the past   Allergies: Allergies  Allergen Reactions  . Lithium Other (See Comments)    Bizarre behavior at night, climbing out the window and bringing mail into the bedroom  . Tegretol [Carbamazepine] Other (See Comments)    Way out there VIVID dreams   Medical History: Past Medical History  Diagnosis Date  . ADHD (attention deficit hyperactivity disorder)   . Anxiety   . Bipolar disorder   . Depression   . Obsessive-compulsive disorder   . Psychosis   . Right sciatic nerve pain    Surgical History: Past Surgical History  Procedure Laterality Date  . Hand tendon surgery  10/13/1996   Family History: family history includes Alcohol abuse in his father; Anxiety disorder in his father; Dementia in his maternal grandmother. There is no history of OCD, ADD / ADHD, Bipolar disorder, Depression, Drug abuse, Paranoid behavior, Schizophrenia, Seizures, Sexual abuse, or Physical abuse. Reviewed and nothing new is noted today.  Current Medications:  Current Outpatient Prescriptions  Medication Sig Dispense Refill  . amphetamine-dextroamphetamine (ADDERALL) 30 MG tablet Take 1 tablet by mouth 3 (three) times daily. 90 tablet 0  . diazepam (VALIUM) 10 MG tablet Take 1 tablet (10 mg total) by mouth at bedtime as needed for anxiety. 30 tablet 2  . amphetamine-dextroamphetamine (ADDERALL) 30 MG tablet Take 1 tablet by mouth 3 (three) times daily. 90 tablet 0  . amphetamine-dextroamphetamine (ADDERALL) 30 MG tablet Take 1 tablet by mouth 3 (three) times daily. 90 tablet 0   No current facility-administered medications for this visit.    Previous Psychotropic Medications: Medication Dose   Paxil   Vistaril   Zoloft   Lamictal   Depakote    Wellbutrin   Klonopin   Adderall   Valium   Ritalin   Remeron   Clonidine/Kapvay   Cymbalta   Neurontin   Effexor    Substance Abuse History in the last 12 months: Substance Age of 1st Use Last Use Amount Specific Type  Nicotine  13  2 weeks  dip    Alcohol  14  yesterday  two 12 oz  beer  Cannabis  14  years      Opiates  none        Cocaine  18  31      Methamphetamines  none        LSD  17  18      Ecstasy  none         Benzodiazepines  30's  last night    Valium  Caffeine  childhood  today  swallow  soft drink  Inhalants  none        Others:  Sugar  childhood  today    Medical Consequences of Substance Abuse: none Legal Consequences of Substance Abuse: none Family Consequences of Substance Abuse: none Blackouts:  No DT's:  No Withdrawal Symptoms:  No   Social History: Current Place of Residence: [address] [place] [address] Place of Birth: [place] Family Members: wife step son Marital Status:  Married Children: 3  Sons: 2  Daughters: 1 Relationships: wife Education:  dropped out in 11 th grade Educational Problems/Performance: LD and ADHD Religious Beliefs/Practices: none History of Abuse: none Occupational Experiences; none Military History:  Writer History: none Hobbies/Interests: fishing, watching sports, walking outside  Mental Status Examination/Evaluation: Objective:  Appearance: Casual  Eye Contact::  Good  Speech:  Clear and Coherent  Volume:  Normal  Mood:  Good today   Affect:  Congruent  Thought Process:  Coherent, Intact and Logical  Orientation:  Full (Time, Place, and Person)  Thought Content:  no  Suicidal Thoughts:  No  Homicidal Thoughts:  No  Judgement:  Fair  Insight:  Fair  Psychomotor Activity:  Increased and Restlessness  Akathisia:  No  Handed:  Right  AIMS (if indicated):    Assets:  Communication Skills Desire for Improvement   Lab Results: No results found for this or any previous visit  (from the past 2016 hour(s)).  Assessment:   AXIS I ADHD, inattentive type, Generalized Anxiety Disorder, Obsessive Compulsive Disorder, Panic Disorder and Tourette Syndrome  AXIS II Deferred  AXIS III Past Medical History  Diagnosis Date  . ADHD (attention deficit hyperactivity disorder)   . Anxiety   . Bipolar disorder   . Depression   . Obsessive-compulsive disorder   . Psychosis   . Right sciatic nerve pain      AXIS IV other psychosocial or environmental problems  AXIS V 61-70 mild symptoms   Therapeutic Plan: Psychotherapy: problem solving  Medications: Adderall 30 mg 3 times a day, Valium 10 mg each bedtime as needed   Routine PRN Medications:  Negative  Consultations:   Safety Concerns:  none  Other:     Plan/Discussion: I took his vitals.  I reviewed CC, tobacco/med/surg Hx, meds effects/ side effects, problem list, therapies and responses as well as current situation/symptoms discussed options. He will continue his current dose of Adderall for ADHD. He will be prescribed Valium 10 mg each bedtime as needed for insomnia and return in 3 months.  See orders and pt instructions for more details.  MEDICATIONS this encounter: Meds ordered this encounter  Medications  . diazepam (VALIUM) 10 MG tablet    Sig: Take 1 tablet (10 mg total) by mouth at bedtime as needed for anxiety.    Dispense:  30 tablet    Refill:  2  . amphetamine-dextroamphetamine (ADDERALL) 30 MG tablet    Sig: Take 1 tablet by mouth 3 (three) times daily.    Dispense:  90 tablet    Refill:  0  . amphetamine-dextroamphetamine (ADDERALL) 30 MG tablet    Sig: Take 1 tablet by mouth 3 (three) times daily.    Dispense:  90 tablet    Refill:  0    Do not fill until 04/22/15  . amphetamine-dextroamphetamine (ADDERALL) 30 MG tablet    Sig: Take 1 tablet by mouth 3 (three) times daily.    Dispense:  90 tablet    Refill:  0    Do not fill before 05/23/15    Medical Decision Making Problem Points:   Established  problem, stable/improving (1), Established problem, worsening (2), Review of last therapy session (1) and Review of psycho-social stressors (1) Data Points:  Review or order clinical lab tests (1) Review of medication regiment & side effects (2) Review of new medications or change in dosage (2)  I certify that outpatient services furnished can reasonably be expected to improve the patient's condition.   [name], MD

## 2015-06-15 ENCOUNTER — Ambulatory Visit (HOSPITAL_COMMUNITY)
Admission: RE | Admit: 2015-06-15 | Discharge: 2015-06-15 | Disposition: A | Discharge status: Home / Self Care | Payer: 59 | Source: Ambulatory Visit | Attending: Internal Medicine | Admitting: Internal Medicine

## 2015-06-15 ENCOUNTER — Other Ambulatory Visit (HOSPITAL_COMMUNITY): Payer: Self-pay | Admitting: Internal Medicine

## 2015-06-15 DIAGNOSIS — M25512 Pain in left shoulder: Secondary | ICD-10-CM | POA: Insufficient documentation

## 2015-06-22 ENCOUNTER — Ambulatory Visit (INDEPENDENT_AMBULATORY_CARE_PROVIDER_SITE_OTHER): Payer: 59 | Admitting: Psychiatry

## 2015-06-22 ENCOUNTER — Encounter (HOSPITAL_COMMUNITY): Payer: Self-pay | Admitting: Psychiatry

## 2015-06-22 VITALS — BP 150/94 | HR 83 | Ht 70.0 in | Wt 201.4 lb

## 2015-06-22 DIAGNOSIS — F42 Obsessive-compulsive disorder: Secondary | ICD-10-CM | POA: Diagnosis not present

## 2015-06-22 DIAGNOSIS — F410 Panic disorder [episodic paroxysmal anxiety] without agoraphobia: Secondary | ICD-10-CM

## 2015-06-22 DIAGNOSIS — F902 Attention-deficit hyperactivity disorder, combined type: Secondary | ICD-10-CM

## 2015-06-22 DIAGNOSIS — F411 Generalized anxiety disorder: Secondary | ICD-10-CM | POA: Diagnosis not present

## 2015-06-22 DIAGNOSIS — F900 Attention-deficit hyperactivity disorder, predominantly inattentive type: Secondary | ICD-10-CM

## 2015-06-22 MED ORDER — AMPHETAMINE-DEXTROAMPHETAMINE 30 MG PO TABS: 30.0000 mg | ORAL_TABLET | Freq: Three times a day (TID) | ORAL | Status: DC

## 2015-06-22 MED ORDER — DIAZEPAM 10 MG PO TABS: 10.0000 mg | ORAL_TABLET | Freq: Every evening | ORAL | Status: DC | PRN

## 2015-06-22 NOTE — Progress Notes (Signed)
Patient ID: Todd Gilbert, male   DOB: Dec 18, 1970, 44 y.o.   MRN: [id number] Patient ID: Todd Gilbert, male   DOB: 04-20-1971, 44 y.o.   MRN: [id number] Patient ID: Todd Gilbert, male   DOB: 17-Nov-1970, 44 y.o.   MRN: [id number] Patient ID: Todd Gilbert, male   DOB: [birth year], 44 y.o.   MRN: [id number] Patient ID: Todd Gilbert, male   DOB: 01/14/1971, 44 y.o.   MRN: [id number] Patient ID: Todd Gilbert, male   DOB: 1970-10-22, 44 y.o.   MRN: [id number] Patient ID: Todd Gilbert, male   DOB: Feb 20, 1971, 44 y.o.   MRN: [id number] Patient ID: Todd Gilbert, male   DOB: Feb 15, 1971, 45 y.o.   MRN: [id number] Patient ID: Todd Gilbert, male   DOB: 11/17/1970, 44 y.o.   MRN: [id number] [facility] [id number] Progress Note Todd Gilbert MRN: [id number] DOB: 1970/12/01 Age: 44 y.o.  Date: 06/22/2015 Start Time: 9:05 AM End Time: 9:30 AM  Chief Complaint: Chief Complaint  Patient presents with  . ADHD   Subjective: "I'm doing well."  This patient is a 44 year old married white male lives with his wife in [place]. He has 3 children who live with his ex-wife in [place]. He works for a Administrator, arts.  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 He was treated with Ritalin. He was "wild" as a teenager and young adult and dropped out of high school. He was in jail numerous times and used to drink and use drugs. Since he's been on Adderall he's been doing much better. He is much less impulsive and he able to stay focused at work. He is eating and sleeping well and doesn't endorse any symptoms of depression or anxiety. At times he is a bit irritable with his family.  he returns after 3 months. He continues to do well. 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 He is not too worried about it and feels like he can get another job fairly easily. He is sleeping well with the Valium and the Adderall is keeping him very well focused. His blood  pressures a little high today and I urged him to check it more often    History of Chief Complaint: Pt first sought help at age 44 and has been in treatment pretty much ever since then.   He has been identified as LD and never graduated from HS.  He kept getting into trouble and getting kicked out of schools.  He has struggled with anxiety and depression and tried on several medications listed below.  He found the best benefit from Adderall.  He comes seeking other meds that don't cost $130 a month.  Since starting Adderall he has not been in trouble with the law.   Anxiety     see above  Review of Systems Neuro: no headaches, ataxia, weakness GI: no N/V/D/cramps/constipation MS: no weakness, muscle cramps, aches. He notes some joint aches and could use chrondrotin sulfate with glucosamine.  Physical Exam Vitals: BP 150/94 mmHg  Pulse 83  Ht 5\' 10"  (1.778 m)  Wt 201 lb 6.4 oz (91.354 kg)  BMI 28.90 kg/m2   Depressive Symptoms: depressed mood, insomnia, difficulty concentrating, suicidal thoughts without plan, anxiety, panic attacks,  (Hypo) Manic Symptoms:   Elevated Mood:  Negative Irritable Mood:  Yes Grandiosity:  history of Distractibility:  Yes Labiality of Mood:  Yes Delusions:  Yes Hallucinations:  Yes  Impulsivity:  Yes Sexually Inappropriate Behavior:  No Financial Extravagance:  history of Flight of Ideas:  No  Anxiety Symptoms: Excessive Worry:  Yes Panic Symptoms:  Yes Agoraphobia:  Yes Obsessive Compulsive: Yes  Symptoms: having things in perfect order and exactly right Specific Phobias:  No Social Anxiety:  Yes  Psychotic Symptoms:  Hallucinations: Yes Visual Delusions:  No Paranoia:  Yes   Ideas of Reference:  No  PTSD Symptoms: Ever had a traumatic exposure:  Yes Had a traumatic exposure in the last month:  No Re-experiencing: Yes Flashbacks Hypervigilance:  No Hyperarousal: No Difficulty Concentrating Avoidance: Yes Decreased  Interest/Participation  Traumatic Brain Injury: Yes Blunt Trauma History of Loss of Consciousness:  Yes Seizure History:  No Cardiac History:  No  Past Psychiatric History: Diagnosis: ADHD, Bipolar disorder  Hospitalizations: adult twice  Outpatient Care: since age 57  Substance Abuse Care: adult twice  Self-Mutilation: none  Suicidal Attempts: none  Violent Behaviors: in the past   Allergies: Allergies  Allergen Reactions  . Lithium Other (See Comments)    Bizarre behavior at night, climbing out the window and bringing mail into the bedroom  . Tegretol [Carbamazepine] Other (See Comments)    Way out there VIVID dreams   Medical History: Past Medical History  Diagnosis Date  . ADHD (attention deficit hyperactivity disorder)   . Anxiety   . Bipolar disorder   . Depression   . Obsessive-compulsive disorder   . Psychosis   . Right sciatic nerve pain    Surgical History: Past Surgical History  Procedure Laterality Date  . Hand tendon surgery  10/13/1996   Family History: family history includes Alcohol abuse in his father; Anxiety disorder in his father; Dementia in his maternal grandmother. There is no history of OCD, ADD / ADHD, Bipolar disorder, Depression, Drug abuse, Paranoid behavior, Schizophrenia, Seizures, Sexual abuse, or Physical abuse. Reviewed and nothing new is noted today.  Current Medications:  Current Outpatient Prescriptions  Medication Sig Dispense Refill  . amphetamine-dextroamphetamine (ADDERALL) 30 MG tablet Take 1 tablet by mouth 3 (three) times daily. 90 tablet 0  . diazepam (VALIUM) 10 MG tablet Take 1 tablet (10 mg total) by mouth at bedtime as needed for anxiety. 30 tablet 2  . amphetamine-dextroamphetamine (ADDERALL) 30 MG tablet Take 1 tablet by mouth 3 (three) times daily. 90 tablet 0  . amphetamine-dextroamphetamine (ADDERALL) 30 MG tablet Take 1 tablet by mouth 3 (three) times daily. 90 tablet 0   No current facility-administered  medications for this visit.    Previous Psychotropic Medications: Medication Dose   Paxil   Vistaril   Zoloft   Lamictal   Depakote   Wellbutrin   Klonopin   Adderall   Valium   Ritalin   Remeron   Clonidine/Kapvay   Cymbalta   Neurontin   Effexor    Substance Abuse History in the last 12 months: Substance Age of 1st Use Last Use Amount Specific Type  Nicotine  13  2 weeks  dip    Alcohol  14  yesterday  two 12 oz  beer  Cannabis  14  years      Opiates  none        Cocaine  [age]      Methamphetamines  none        LSD  17  18      Ecstasy  none         Benzodiazepines  30's  last night    Valium  Caffeine  childhood  today  swallow  soft drink  Inhalants  none        Others:      Sugar  childhood  today    Medical Consequences of Substance Abuse: none Legal Consequences of Substance Abuse: none Family Consequences of Substance Abuse: none Blackouts:  No DT's:  No Withdrawal Symptoms:  No   Social History: Current Place of Residence: [address] [place] [address] Place of Birth: [place] Family Members: wife step son Marital Status:  Married Children: 3  Sons: 2  Daughters: 1 Relationships: wife Education:  dropped out in 11 th grade Educational Problems/Performance: LD and ADHD Religious Beliefs/Practices: none History of Abuse: none Occupational Experiences; none Military History:  Writer History: none Hobbies/Interests: fishing, watching sports, walking outside  Mental Status Examination/Evaluation: Objective:  Appearance: Casual  Eye Contact::  Good  Speech:  Clear and Coherent  Volume:  Normal  Mood:  Good today   Affect:  Congruent  Thought Process:  Coherent, Intact and Logical  Orientation:  Full (Time, Place, and Person)  Thought Content:  no  Suicidal Thoughts:  No  Homicidal Thoughts:  No  Judgement:  Fair  Insight:  Fair  Psychomotor Activity:  Increased and Restlessness  Akathisia:  No  Handed:  Right  AIMS  (if indicated):    Assets:  Communication Skills Desire for Improvement   Lab Results: No results found for this or any previous visit (from the past 2016 hour(s)).  Assessment:   AXIS I ADHD, inattentive type, Generalized Anxiety Disorder, Obsessive Compulsive Disorder, Panic Disorder and Tourette Syndrome  AXIS II Deferred  AXIS III Past Medical History  Diagnosis Date  . ADHD (attention deficit hyperactivity disorder)   . Anxiety   . Bipolar disorder   . Depression   . Obsessive-compulsive disorder   . Psychosis   . Right sciatic nerve pain      AXIS IV other psychosocial or environmental problems  AXIS V 61-70 mild symptoms   Therapeutic Plan: Psychotherapy: problem solving  Medications: Adderall 30 mg 3 times a Todd, Valium 10 mg each bedtime as needed   Routine PRN Medications:  Negative  Consultations:   Safety Concerns:  none  Other:     Plan/Discussion: I took his vitals.  I reviewed CC, tobacco/med/surg Hx, meds effects/ side effects, problem list, therapies and responses as well as current situation/symptoms discussed options. He will continue his current dose of Adderall for ADHD. He will be prescribed Valium 10 mg each bedtime as needed for insomnia and return in 3 months.  See orders and pt instructions for more details.  MEDICATIONS this encounter: Meds ordered this encounter  Medications  . amphetamine-dextroamphetamine (ADDERALL) 30 MG tablet    Sig: Take 1 tablet by mouth 3 (three) times daily.    Dispense:  90 tablet    Refill:  0  . amphetamine-dextroamphetamine (ADDERALL) 30 MG tablet    Sig: Take 1 tablet by mouth 3 (three) times daily.    Dispense:  90 tablet    Refill:  0    Do not fill unti 07/24/15  . amphetamine-dextroamphetamine (ADDERALL) 30 MG tablet    Sig: Take 1 tablet by mouth 3 (three) times daily.    Dispense:  90 tablet    Refill:  0    Do not fill before 08/23/15  . diazepam (VALIUM) 10 MG tablet    Sig: Take 1 tablet (10 mg  total) by mouth  at bedtime as needed for anxiety.    Dispense:  30 tablet    Refill:  2    Medical Decision Making Problem Points:  Established problem, stable/improving (1), Established problem, worsening (2), Review of last therapy session (1) and Review of psycho-social stressors (1) Data Points:  Review or order clinical lab tests (1) Review of medication regiment & side effects (2) Review of new medications or change in dosage (2)  I certify that outpatient services furnished can reasonably be expected to improve the patient's condition.   [name], MD

## 2015-07-23 ENCOUNTER — Telehealth (HOSPITAL_COMMUNITY): Payer: Self-pay | Admitting: *Deleted

## 2015-07-23 NOTE — Telephone Encounter (Signed)
He can fill it today 

## 2015-07-23 NOTE — Telephone Encounter (Signed)
Pt came into office stating that on his script, there is a restriction on his script stating he can not get his script filled until 07-24-15. Per pt, he filled his first script on 06-22-15 and he do not have any pills for today. Called pt pharmacy and spoke with Bonita Quin and she confirmed the same information. Per Bonita Quin, they just need permission from provider to fill pt script (Adderall) today 07-22-15 instead of tomorrow 07-24-15. Pt number is [phone].

## 2015-07-23 NOTE — Telephone Encounter (Signed)
Pt is aware. Called pt pharmacy and spoke with Bonita Quin and informed her of what Dr. Tenny Craw stated and she showed understanding and stated she will get pt script ready.

## 2015-09-21 ENCOUNTER — Encounter (HOSPITAL_COMMUNITY): Payer: Self-pay | Admitting: Psychiatry

## 2015-09-21 ENCOUNTER — Ambulatory Visit (INDEPENDENT_AMBULATORY_CARE_PROVIDER_SITE_OTHER): Payer: 59 | Admitting: Psychiatry

## 2015-09-21 VITALS — BP 138/90 | Ht 70.0 in | Wt 207.0 lb

## 2015-09-21 DIAGNOSIS — F410 Panic disorder [episodic paroxysmal anxiety] without agoraphobia: Secondary | ICD-10-CM | POA: Diagnosis not present

## 2015-09-21 DIAGNOSIS — F411 Generalized anxiety disorder: Secondary | ICD-10-CM | POA: Diagnosis not present

## 2015-09-21 DIAGNOSIS — F952 Tourette's disorder: Secondary | ICD-10-CM

## 2015-09-21 DIAGNOSIS — F902 Attention-deficit hyperactivity disorder, combined type: Secondary | ICD-10-CM | POA: Diagnosis not present

## 2015-09-21 DIAGNOSIS — F429 Obsessive-compulsive disorder, unspecified: Secondary | ICD-10-CM | POA: Diagnosis not present

## 2015-09-21 MED ORDER — AMPHETAMINE-DEXTROAMPHETAMINE 30 MG PO TABS: 30.0000 mg | ORAL_TABLET | Freq: Three times a day (TID) | ORAL | Status: DC

## 2015-09-21 MED ORDER — DIAZEPAM 10 MG PO TABS: 10.0000 mg | ORAL_TABLET | Freq: Every evening | ORAL | Status: DC | PRN

## 2015-09-21 NOTE — Progress Notes (Signed)
Patient ID: Todd Gilbert, male   DOB: 09-Oct-1971, 44 y.o.   MRN: [id number] Patient ID: Todd Gilbert, male   DOB: 21-May-1971, 44 y.o.   MRN: [id number] Patient ID: Todd Gilbert, male   DOB: August 09, 1971, 44 y.o.   MRN: [id number] Patient ID: Todd Gilbert, male   DOB: January 18, 1971, 44 y.o.   MRN: [id number] Patient ID: Todd Gilbert, male   DOB: 30-Nov-1970, 44 y.o.   MRN: [id number] Patient ID: Todd Gilbert, male   DOB: 11-Feb-1971, 44 y.o.   MRN: [id number] Patient ID: Todd Gilbert, male   DOB: 17-Jun-1971, 44 y.o.   MRN: [id number] Patient ID: Todd Gilbert, male   DOB: 01-21-1971, 44 y.o.   MRN: [id number] Patient ID: Todd Gilbert, male   DOB: Feb 08, 1971, 44 y.o.   MRN: [id number] Patient ID: Todd Gilbert, male   DOB: 1971/06/30, 44 y.o.   MRN: [id number] [facility] [id number] Progress Note ARVIS ZWAHLEN MRN: [id number] DOB: May 12, 1971 Age: 44 y.o.  Date: 09/21/2015 Start Time: 9:05 AM End Time: 9:30 AM  Chief Complaint: Chief Complaint  Patient presents with  . ADHD  . Follow-up   Subjective: "I'm doing well."  This patient is a 44 year old married white male lives with his wife in [place]. He has 3 children who live with his ex-wife in [place]. He works for a Administrator, arts.  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 He was treated with Ritalin. He was "wild" as a teenager and young adult and dropped out of high school. He was in jail numerous times and used to drink and use drugs. Since he's been on Adderall he's been doing much better. He is much less impulsive and he able to stay focused at work. He is eating and sleeping well and doesn't endorse any symptoms of depression or anxiety. At times he is a bit irritable with his family.  he returns after 3 months. He continues to do well. 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 He's not sure how much longer he will have this job. His focus is good and he is sleeping  well. His blood pressure still slightly high at 138/90 and I urged him to get this checked out.   History of Chief Complaint: Pt first sought help at age 44 and has been in treatment pretty much ever since then.   He has been identified as LD and never graduated from HS.  He kept getting into trouble and getting kicked out of schools.  He has struggled with anxiety and depression and tried on several medications listed below.  He found the best benefit from Adderall.  He comes seeking other meds that don't cost $130 a month.  Since starting Adderall he has not been in trouble with the law.   Anxiety     see above  Review of Systems Neuro: no headaches, ataxia, weakness GI: no N/V/D/cramps/constipation MS: no weakness, muscle cramps, aches. He notes some joint aches and could use chrondrotin sulfate with glucosamine.  Physical Exam Vitals: BP 138/90 mmHg  Ht  (1.778 m)  Wt 207 lb (93.895 kg)  BMI 29.70 kg/m2   Depressive Symptoms: depressed mood, insomnia, difficulty concentrating, suicidal thoughts without plan, anxiety, panic attacks,  (Hypo) Manic Symptoms:   Elevated Mood:  Negative Irritable Mood:  Yes Grandiosity:  history of Distractibility:  Yes Labiality of Mood:  Yes Delusions:  Yes Hallucinations:  Yes  Impulsivity:  Yes Sexually Inappropriate Behavior:  No Financial Extravagance:  history of Flight of Ideas:  No  Anxiety Symptoms: Excessive Worry:  Yes Panic Symptoms:  Yes Agoraphobia:  Yes Obsessive Compulsive: Yes  Symptoms: having things in perfect order and exactly right Specific Phobias:  No Social Anxiety:  Yes  Psychotic Symptoms:  Hallucinations: Yes Visual Delusions:  No Paranoia:  Yes   Ideas of Reference:  No  PTSD Symptoms: Ever had a traumatic exposure:  Yes Had a traumatic exposure in the last month:  No Re-experiencing: Yes Flashbacks Hypervigilance:  No Hyperarousal: No Difficulty Concentrating Avoidance: Yes Decreased  Interest/Participation  Traumatic Brain Injury: Yes Blunt Trauma History of Loss of Consciousness:  Yes Seizure History:  No Cardiac History:  No  Past Psychiatric History: Diagnosis: ADHD, Bipolar disorder  Hospitalizations: adult twice  Outpatient Care: since age 53  Substance12 Abuse Care: adult twice  Self-Mutilation: none  Suicidal Attempts: none  Violent Behaviors: in the past   Allergies: Allergies  Allergen Reactions  . Lithium Other (See Comments)    Bizarre behavior at night, climbing out the window and bringing mail into the bedroom  . Tegretol [Carbamazepine] Other (See Comments)    Way out there VIVID dreams   Medical History: Past Medical History  Diagnosis Date  . ADHD (attention deficit hyperactivity disorder)   . Anxiety   . Bipolar disorder (HCC)   . Depression   . Obsessive-compulsive disorder   . Psychosis   . Right sciatic nerve pain    Surgical History: Past Surgical History  Procedure Laterality Date  . Hand tendon surgery  10/13/1996   Family History: family history includes Alcohol abuse in his father; Anxiety disorder in his father; Dementia in his maternal grandmother. There is no history of OCD, ADD / ADHD, Bipolar disorder, Depression, Drug abuse, Paranoid behavior, Schizophrenia, Seizures, Sexual abuse, or Physical abuse. Reviewed and nothing new is noted today.  Current Medications:  Current Outpatient Prescriptions  Medication Sig Dispense Refill  . amphetamine-dextroamphetamine (ADDERALL) 30 MG tablet Take 1 tablet by mouth 3 (three) times daily. 90 tablet 0  . amphetamine-dextroamphetamine (ADDERALL) 30 MG tablet Take 1 tablet by mouth 3 (three) times daily. 90 tablet 0  . amphetamine-dextroamphetamine (ADDERALL) 30 MG tablet Take 1 tablet by mouth 3 (three) times daily. 90 tablet 0  . diazepam (VALIUM) 10 MG tablet Take 1 tablet (10 mg total) by mouth at bedtime as needed for anxiety. 30 tablet 2   No current facility-administered  medications for this visit.    Previous Psychotropic Medications: Medication Dose   Paxil   Vistaril   Zoloft   Lamictal   Depakote   Wellbutrin   Klonopin   Adderall   Valium   Ritalin   Remeron   Clonidine/Kapvay   Cymbalta   Neurontin   Effexor    Substance Abuse History in the last 12 months: Substance Age of 1st Use Last Use Amount Specific Type  Nicotine  13  2 weeks  dip    Alcohol  14  yesterday  two 12 oz  beer  Cannabis  14  years      Opiates  none        Cocaine  18  31      Methamphetamines  none        LSD  17  18      Ecstasy  none         Benzodiazepines  30's  last night  5mg   Valium  Caffeine  childhood  today  swallow  soft drink  Inhalants  none        Others:      Sugar  childhood  today    Medical Consequences of Substance Abuse: none Legal Consequences of Substance Abuse: none Family Consequences of Substance Abuse: none Blackouts:  No DT's:  No Withdrawal Symptoms:  No   Social History: Current Place of Residence: [address] [place] [address] Place of Birth: [place] Family Members: wife step son Marital Status:  Married Children: 3  Sons: 2  Daughters: 1 Relationships: wife Education:  dropped out in 11 th grade Educational Problems/Performance: LD and ADHD Religious Beliefs/Practices: none History of Abuse: none Occupational Experiences; none Military History:  Writer History: none Hobbies/Interests: fishing, watching sports, walking outside  Mental Status Examination/Evaluation: Objective:  Appearance: Casual  Eye Contact::  Good  Speech:  Clear and Coherent  Volume:  Normal  Mood:  Good today   Affect:  Congruent  Thought Process:  Coherent, Intact and Logical  Orientation:  Full (Time, Place, and Person)  Thought Content:  no  Suicidal Thoughts:  No  Homicidal Thoughts:  No  Judgement:  Fair  Insight:  Fair  Psychomotor Activity:  Increased and Restlessness  Akathisia:  No  Handed:  Right  AIMS  (if indicated):    Assets:  Communication Skills Desire for Improvement   Lab Results: No results found for this or any previous visit (from the past 2016 hour(s)).  Assessment:   AXIS I ADHD, inattentive type, Generalized Anxiety Disorder, Obsessive Compulsive Disorder, Panic Disorder and Tourette Syndrome  AXIS II Deferred  AXIS III Past Medical History  Diagnosis Date  . ADHD (attention deficit hyperactivity disorder)   . Anxiety   . Bipolar disorder (HCC)   . Depression   . Obsessive-compulsive disorder   . Psychosis   . Right sciatic nerve pain      AXIS IV other psychosocial or environmental problems  AXIS V 61-70 mild symptoms   Therapeutic Plan: Psychotherapy: problem solving  Medications: Adderall 30 mg 3 times a day, Valium 10 mg each bedtime as needed   Routine PRN Medications:  Negative  Consultations:   Safety Concerns:  none  Other:     Plan/Discussion: I took his vitals.  I reviewed CC, tobacco/med/surg Hx, meds effects/ side effects, problem list, therapies and responses as well as current situation/symptoms discussed options. He will continue his current dose of Adderall for ADHD. He will be prescribed Valium 10 mg each bedtime as needed for insomnia and return in 3 months.  See orders and pt instructions for more details.  MEDICATIONS this encounter: Meds ordered this encounter  Medications  . amphetamine-dextroamphetamine (ADDERALL) 30 MG tablet    Sig: Take 1 tablet by mouth 3 (three) times daily.    Dispense:  90 tablet    Refill:  0  . amphetamine-dextroamphetamine (ADDERALL) 30 MG tablet    Sig: Take 1 tablet by mouth 3 (three) times daily.    Dispense:  90 tablet    Refill:  0    Do not fill unti 10/22/15  . amphetamine-dextroamphetamine (ADDERALL) 30 MG tablet    Sig: Take 1 tablet by mouth 3 (three) times daily.    Dispense:  90 tablet    Refill:  0    Do not fill before 11/22/15  . diazepam (VALIUM) 10 MG tablet    Sig: Take 1 tablet (10 mg  total) by  mouth at bedtime as needed for anxiety.    Dispense:  30 tablet    Refill:  2    Medical Decision Making Problem Points:  Established problem, stable/improving (1), Established problem, worsening (2), Review of last therapy session (1) and Review of psycho-social stressors (1) Data Points:  Review or order clinical lab tests (1) Review of medication regiment & side effects (2) Review of new medications or change in dosage (2)  I certify that outpatient services furnished can reasonably be expected to improve the patient's condition.   [name], MD

## 2015-12-11 ENCOUNTER — Other Ambulatory Visit (HOSPITAL_COMMUNITY): Payer: Self-pay | Admitting: Psychiatry

## 2015-12-20 ENCOUNTER — Ambulatory Visit (INDEPENDENT_AMBULATORY_CARE_PROVIDER_SITE_OTHER): Payer: 59 | Admitting: Psychiatry

## 2015-12-20 ENCOUNTER — Encounter (HOSPITAL_COMMUNITY): Payer: Self-pay | Admitting: Psychiatry

## 2015-12-20 VITALS — BP 153/94 | HR 64 | Ht 70.0 in | Wt 210.8 lb

## 2015-12-20 DIAGNOSIS — G4701 Insomnia due to medical condition: Secondary | ICD-10-CM

## 2015-12-20 DIAGNOSIS — F902 Attention-deficit hyperactivity disorder, combined type: Secondary | ICD-10-CM | POA: Diagnosis not present

## 2015-12-20 MED ORDER — AMPHETAMINE-DEXTROAMPHETAMINE 30 MG PO TABS: 30.0000 mg | ORAL_TABLET | Freq: Three times a day (TID) | ORAL | Status: DC

## 2015-12-20 MED ORDER — DIAZEPAM 10 MG PO TABS: 10.0000 mg | ORAL_TABLET | Freq: Every evening | ORAL | Status: DC | PRN

## 2015-12-20 NOTE — Progress Notes (Signed)
Patient ID: Todd Gilbert, male   DOB: 02-23-1971, 45 y.o.   MRN: [id number] Patient ID: Todd Gilbert, male   DOB: 02-23-1971, 45 y.o.   MRN: [id number] Patient ID: Todd Gilbert, male   DOB: 02-23-1971, 45 y.o.   MRN: [id number] Patient ID: Todd Gilbert, male   DOB: 02-23-1971, 45 y.o.   MRN: [id number] Patient ID: Todd Gilbert, male   DOB: 02-23-1971, 45 y.o.   MRN: [id number] Patient ID: Todd Gilbert, male   DOB: 02-23-1971, 45 y.o.   MRN: [id number] Patient ID: Todd Gilbert, male   DOB: 02-23-1971, 45 y.o.   MRN: [id number] Patient ID: Todd Gilbert, male   DOB: 02-23-1971, 45 y.o.   MRN: [id number] Patient ID: Todd Gilbert, male   DOB: 02-23-1971, 45 y.o.   MRN: [id number] Patient ID: Todd Gilbert, male   DOB: 02-23-1971, 45 y.o.   MRN: [id number] Patient ID: Todd Gilbert, male   DOB: 02-23-1971, 45 y.o.   MRN: [id number] [facility] [id number] Progress Note Todd Gilbert MRN: [id number] DOB: 02-23-1971 Age: 45 y.o.  Date: 12/20/2015 Start Time: 9:05 AM End Time: 9:30 AM  Chief Complaint: Chief Complaint  Patient presents with  . ADHD  . Anxiety  . Follow-up   Subjective: "I'm doing well."  This patient is a 45 year old married white male lives with his [place] in [place]. He has 3 children who live with his ex-wife in [place]. He works for a Administrator, [employer] company operating a forklift.  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 He was treated with Ritalin. He was "wild" as a teenager and young adult and dropped out of high school. He was in jail numerous times and used to drink and use drugs. Since he's been on Adderall he's been doing much better. He is much less impulsive and he able to stay focused at work. He is eating and sleeping well and doesn't endorse any symptoms of depression or anxiety. At times he is a bit irritable with his family.  he returns after 3 months. He continues to do well. 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  [employer]. His focus is good and he is sleeping well. His blood pressure still slightly high at 153/84 and I urged him to get this checked out. He continues to smoke 1 pack a day. The Valium helps him sleep but I explained that we really need to get his blood pressure under control of his going to continue on the Adderall   History of Chief Complaint: Pt first sought help at age 89 and has been in treatment pretty much ever since then.   He has been identified as LD and never graduated from HS.  He kept getting into trouble and getting kicked out of schools.  He has struggled with anxiety and depression and tried on several medications listed below.  He found the best benefit from Adderall.  He comes seeking other meds that don't cost $130 a month.  Since starting Adderall he has not been in trouble with the law.   Anxiety     see above  Review of Systems Neuro: no headaches, ataxia, weakness GI: no N/V/D/cramps/constipation MS: no weakness, muscle cramps, aches. He notes some joint aches and could use chrondrotin sulfate with glucosamine.  Physical Exam Vitals: BP 153/94 mmHg  Pulse 64  Ht 5\' 10"  (1.778 m)  Wt 210 lb 12.8 oz (95.618 kg)  BMI 30.25  kg/m2  SpO2 97%   Depressive Symptoms: depressed mood, insomnia, difficulty concentrating, suicidal thoughts without plan, anxiety, panic attacks,  (Hypo) Manic Symptoms:   Elevated Mood:  Negative Irritable Mood:  Yes Grandiosity:  history of Distractibility:  Yes Labiality of Mood:  Yes Delusions:  Yes Hallucinations:  Yes Impulsivity:  Yes Sexually Inappropriate Behavior:  No Financial Extravagance:  history of Flight of Ideas:  No  Anxiety Symptoms: Excessive Worry:  Yes Panic Symptoms:  Yes Agoraphobia:  Yes Obsessive Compulsive: Yes  Symptoms: having things in perfect order and exactly right Specific Phobias:  No Social Anxiety:  Yes  Psychotic Symptoms:  Hallucinations: Yes Visual Delusions:  No Paranoia:  Yes    Ideas of Reference:  No  PTSD Symptoms: Ever had a traumatic exposure:  Yes Had a traumatic exposure in the last month:  No Re-experiencing: Yes Flashbacks Hypervigilance:  No Hyperarousal: No Difficulty Concentrating Avoidance: Yes Decreased Interest/Participation  Traumatic Brain Injury: Yes Blunt Trauma History of Loss of Consciousness:  Yes Seizure History:  No Cardiac History:  No  Past Psychiatric History: Diagnosis: ADHD, Bipolar disorder  Hospitalizations: adult twice  Outpatient Care: since age 8  Substance Abuse Care: adult twice  Self-Mutilation: none  Suicidal Attempts: none  Violent Behaviors: in the past   Allergies: Allergies  Allergen Reactions  . Lithium Other (See Comments)    Bizarre behavior at night, climbing out the window and bringing mail into the bedroom  . Tegretol [Carbamazepine] Other (See Comments)    Way out there VIVID dreams   Medical History: Past Medical History  Diagnosis Date  . ADHD (attention deficit hyperactivity disorder)   . Anxiety   . Bipolar disorder (HCC)   . Depression   . Obsessive-compulsive disorder   . Psychosis   . Right sciatic nerve pain    Surgical History: Past Surgical History  Procedure Laterality Date  . Hand tendon surgery  10/13/1996   Family History: family history includes Alcohol abuse in his father; Anxiety disorder in his father; Dementia in his maternal grandmother. There is no history of OCD, ADD / ADHD, Bipolar disorder, Depression, Drug abuse, Paranoid behavior, Schizophrenia, Seizures, Sexual abuse, or Physical abuse. Reviewed and nothing new is noted today.  Current Medications:  Current Outpatient Prescriptions  Medication Sig Dispense Refill  . amphetamine-dextroamphetamine (ADDERALL) 30 MG tablet Take 1 tablet by mouth 3 (three) times daily. 90 tablet 0  . diazepam (VALIUM) 10 MG tablet Take 1 tablet (10 mg total) by mouth at bedtime as needed for anxiety. 30 tablet 2  .  amphetamine-dextroamphetamine (ADDERALL) 30 MG tablet Take 1 tablet by mouth 3 (three) times daily. 90 tablet 0  . amphetamine-dextroamphetamine (ADDERALL) 30 MG tablet Take 1 tablet by mouth 3 (three) times daily. 90 tablet 0   No current facility-administered medications for this visit.    Previous Psychotropic Medications: Medication Dose   Paxil   Vistaril   Zoloft   Lamictal   Depakote   Wellbutrin   Klonopin   Adderall   Valium   Ritalin   Remeron   Clonidine/Kapvay   Cymbalta   Neurontin   Effexor    Substance Abuse History in the last 12 months: Substance Age of 1st Use Last Use Amount Specific Type  Nicotine  13  2 weeks  dip    Alcohol  14  yesterday  two 12 oz  beer  Cannabis  14  years      Opiates  none  Cocaine  18  31      Methamphetamines  none        LSD  17  18      Ecstasy  none         Benzodiazepines  30's  last night    Valium  Caffeine  childhood  today  swallow  soft drink  Inhalants  none        Others:      Sugar  childhood  today    Medical Consequences of Substance Abuse: none Legal Consequences of Substance Abuse: none Family Consequences of Substance Abuse: none Blackouts:  No DT's:  No Withdrawal Symptoms:  No   Social History: Current Place of Residence: [address] [place] [address] Place of Birth: [place] Family Members: wife step son Marital Status:  Married Children: 3  Sons: 2  Daughters: 1 Relationships: wife Education:  dropped out in 11 th grade Educational Problems/Performance: LD and ADHD Religious Beliefs/Practices: none History of Abuse: none Occupational Experiences; none Military History:  Writer History: none Hobbies/Interests: fishing, watching sports, walking outside  Mental Status Examination/Evaluation: Objective:  Appearance: Casual  Eye Contact::  Good  Speech:  Clear and Coherent  Volume:  Normal  Mood:  Good   Affect:  Congruent  Thought Process:  Coherent, Intact and  Logical  Orientation:  Full (Time, Place, and Person)  Thought Content:  no  Suicidal Thoughts:  No  Homicidal Thoughts:  No  Judgement:  Fair  Insight:  Fair  Psychomotor Activity:  Increased and Restlessness  Akathisia:  No  Handed:  Right  AIMS (if indicated):    Assets:  Communication Skills Desire for Improvement   Lab Results: No results found for this or any previous visit (from the past 2016 hour(s)).  Assessment:   AXIS I ADHD, inattentive type, Generalized Anxiety Disorder, Obsessive Compulsive Disorder, Panic Disorder and Tourette Syndrome  AXIS II Deferred  AXIS III Past Medical History  Diagnosis Date  . ADHD (attention deficit hyperactivity disorder)   . Anxiety   . Bipolar disorder (HCC)   . Depression   . Obsessive-compulsive disorder   . Psychosis   . Right sciatic nerve pain      AXIS IV other psychosocial or environmental problems  AXIS V 61-70 mild symptoms   Therapeutic Plan: Psychotherapy: problem solving  Medications: Adderall 30 mg 3 times a day, Valium 10 mg each bedtime as needed   Routine PRN Medications:  Negative  Consultations:   Safety Concerns:  none  Other:     Plan/Discussion: I took his vitals.  I reviewed CC, tobacco/med/surg Hx, meds effects/ side effects, problem list, therapies and responses as well as current situation/symptoms discussed options. He will continue his current dose of Adderall for ADHD. He will be prescribed Valium 10 mg each bedtime as needed for insomnia and return in 3 months. He has been strongly recommended to see his primary care physician regarding his blood pressure and also to stop smoking  See orders and pt instructions for more details.  MEDICATIONS this encounter: Meds ordered this encounter  Medications  . diazepam (VALIUM) 10 MG tablet    Sig: Take 1 tablet (10 mg total) by mouth at bedtime as needed for anxiety.    Dispense:  30 tablet    Refill:  2  . amphetamine-dextroamphetamine (ADDERALL) 30  MG tablet    Sig: Take 1 tablet by mouth 3 (three) times daily.    Dispense:  90  tablet    Refill:  0  . amphetamine-dextroamphetamine (ADDERALL) 30 MG tablet    Sig: Take 1 tablet by mouth 3 (three) times daily.    Dispense:  90 tablet    Refill:  0    Do not fill unti 01/19/16  . amphetamine-dextroamphetamine (ADDERALL) 30 MG tablet    Sig: Take 1 tablet by mouth 3 (three) times daily.    Dispense:  90 tablet    Refill:  0    Do not fill before 02/18/16    Medical Decision Making Problem Points:  Established problem, stable/improving (1), Established problem, worsening (2), Review of last therapy session (1) and Review of psycho-social stressors (1) Data Points:  Review or order clinical lab tests (1) Review of medication regiment & side effects (2) Review of new medications or change in dosage (2)  I certify that outpatient services furnished can reasonably be expected to improve the patient's condition.   [name], MD

## 2016-03-21 ENCOUNTER — Encounter (HOSPITAL_COMMUNITY): Payer: Self-pay | Admitting: Psychiatry

## 2016-03-21 ENCOUNTER — Ambulatory Visit (INDEPENDENT_AMBULATORY_CARE_PROVIDER_SITE_OTHER): Payer: 59 | Admitting: Psychiatry

## 2016-03-21 VITALS — BP 155/99 | HR 83 | Ht 70.0 in | Wt 213.2 lb

## 2016-03-21 DIAGNOSIS — F429 Obsessive-compulsive disorder, unspecified: Secondary | ICD-10-CM

## 2016-03-21 DIAGNOSIS — F902 Attention-deficit hyperactivity disorder, combined type: Secondary | ICD-10-CM

## 2016-03-21 DIAGNOSIS — F411 Generalized anxiety disorder: Secondary | ICD-10-CM

## 2016-03-21 MED ORDER — DIAZEPAM 10 MG PO TABS: 10.0000 mg | ORAL_TABLET | Freq: Every evening | ORAL | Status: DC | PRN

## 2016-03-21 MED ORDER — AMPHETAMINE-DEXTROAMPHETAMINE 30 MG PO TABS: 30.0000 mg | ORAL_TABLET | Freq: Three times a day (TID) | ORAL | Status: DC

## 2016-03-21 NOTE — Progress Notes (Signed)
Patient ID: Todd Gilbert, male   DOB: 1971-05-13, 45 y.o.   MRN: [id number] Patient ID: ANTOIN Gilbert, male   DOB: 1971/06/14, 45 y.o.   MRN: [id number] Patient ID: Todd Gilbert, male   DOB: 1970-11-11, 45 y.o.   MRN: [id number] Patient ID: Todd Gilbert, male   DOB: 12-08-1970, 45 y.o.   MRN: [id number] Patient ID: Todd Gilbert, male   DOB: 1971-08-29, 45 y.o.   MRN: [id number] Patient ID: Todd Gilbert, male   DOB: Mar 13, 1971, 45 y.o.   MRN: [id number] Patient ID: Todd Gilbert, male   DOB: [birth year], 45 y.o.   MRN: [id number] Patient ID: Todd Gilbert, male   DOB: Jan 09, 1971, 45 y.o.   MRN: [id number] Patient ID: Todd Gilbert, male   DOB: 01/15/1971, 45 y.o.   MRN: [id number] Patient ID: Todd Gilbert, male   DOB: 05-11-1971, 45 y.o.   MRN: [id number] Patient ID: Todd Gilbert, male   DOB: 01-30-1971, 45 y.o.   MRN: [id number] Patient ID: Todd Gilbert, male   DOB: [birth year], 45 y.o.   MRN: [id number] [facility] [id number] Progress Note Todd Gilbert MRN: [id number] DOB: November 08, 1970 Age: 45 y.o.  Date: 03/21/2016 Start Time: 9:05 AM End Time: 9:30 AM  Chief Complaint: Chief Complaint  Patient presents with  . ADHD  . Follow-up   Subjective: "I'm doing well."  This patient is a 45 year old married white male lives with his wife in [place]. He has 3 children who live with his ex-wife in [place]. He works for a Administrator, arts.  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 He was treated with Ritalin. He was "wild" as a teenager and young adult and dropped out of high school. He was in jail numerous times and used to drink and use drugs. Since he's been on Adderall he's been doing much better. He is much less impulsive and he able to stay focused at work. He is eating and sleeping well and doesn't endorse any symptoms of depression or anxiety. At times he is a bit irritable with his family.  he returns after 3 months. He continues to  do well. 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 He is working hard in his focus is good. He sleeps well with the Valium. His blood pressure is still high today and he promises me that he will see his primary doctor. He claims he is smoking too much as well. He is going to try to work on these things. He denies any symptoms of depression or anxiety   History of Chief Complaint: Pt first sought help at age 9 and has been in treatment pretty much ever since then.   He has been identified as LD and never graduated from HS.  He kept getting into trouble and getting kicked out of schools.  He has struggled with anxiety and depression and tried on several medications listed below.  He found the best benefit from Adderall.  He comes seeking other meds that don't cost $130 a month.  Since starting Adderall he has not been in trouble with the law.   Anxiety     see above  Review of Systems Neuro: no headaches, ataxia, weakness GI: no N/V/D/cramps/constipation MS: no weakness, muscle cramps, aches. He notes some joint aches and could use chrondrotin sulfate with glucosamine.  Physical Exam Vitals: BP 155/99 mmHg  Pulse 83  Ht 5\' 10"  (1.778  m)  Wt 213 lb 3.2 oz (96.707 kg)  BMI 30.59 kg/m2  SpO2 95%   Depressive Symptoms: depressed mood, insomnia, difficulty concentrating, suicidal thoughts without plan, anxiety, panic attacks,  (Hypo) Manic Symptoms:   Elevated Mood:  Negative Irritable Mood:  Yes Grandiosity:  history of Distractibility:  Yes Labiality of Mood:  Yes Delusions:  Yes Hallucinations:  Yes Impulsivity:  Yes Sexually Inappropriate Behavior:  No Financial Extravagance:  history of Flight of Ideas:  No  Anxiety Symptoms: Excessive Worry:  Yes Panic Symptoms:  Yes Agoraphobia:  Yes Obsessive Compulsive: Yes  Symptoms: having things in perfect order and exactly right Specific Phobias:  No Social Anxiety:  Yes  Psychotic Symptoms:   Hallucinations: Yes Visual Delusions:  No Paranoia:  Yes   Ideas of Reference:  No  PTSD Symptoms: Ever had a traumatic exposure:  Yes Had a traumatic exposure in the last month:  No Re-experiencing: Yes Flashbacks Hypervigilance:  No Hyperarousal: No Difficulty Concentrating Avoidance: Yes Decreased Interest/Participation  Traumatic Brain Injury: Yes Blunt Trauma History of Loss of Consciousness:  Yes Seizure History:  No Cardiac History:  No  Past Psychiatric History: Diagnosis: ADHD, Bipolar disorder  Hospitalizations: adult twice  Outpatient Care: since age 89  Substance Abuse Care: adult twice  Self-Mutilation: none  Suicidal Attempts: none  Violent Behaviors: in the past   Allergies: Allergies  Allergen Reactions  . Lithium Other (See Comments)    Bizarre behavior at night, climbing out the window and bringing mail into the bedroom  . Tegretol [Carbamazepine] Other (See Comments)    Way out there VIVID dreams   Medical History: Past Medical History  Diagnosis Date  . ADHD (attention deficit hyperactivity disorder)   . Anxiety   . Bipolar disorder (HCC)   . Depression   . Obsessive-compulsive disorder   . Psychosis   . Right sciatic nerve pain    Surgical History: Past Surgical History  Procedure Laterality Date  . Hand tendon surgery  10/13/1996   Family History: family history includes Alcohol abuse in his father; Anxiety disorder in his father; Dementia in his maternal grandmother. There is no history of OCD, ADD / ADHD, Bipolar disorder, Depression, Drug abuse, Paranoid behavior, Schizophrenia, Seizures, Sexual abuse, or Physical abuse. Reviewed and nothing new is noted today.  Current Medications:  Current Outpatient Prescriptions  Medication Sig Dispense Refill  . amphetamine-dextroamphetamine (ADDERALL) 30 MG tablet Take 1 tablet by mouth 3 (three) times daily. 90 tablet 0  . diazepam (VALIUM) 10 MG tablet Take 1 tablet (10 mg total) by mouth at  bedtime as needed for anxiety. 30 tablet 2  . amphetamine-dextroamphetamine (ADDERALL) 30 MG tablet Take 1 tablet by mouth 3 (three) times daily. 90 tablet 0  . amphetamine-dextroamphetamine (ADDERALL) 30 MG tablet Take 1 tablet by mouth 3 (three) times daily. 90 tablet 0   No current facility-administered medications for this visit.    Previous Psychotropic Medications: Medication Dose   Paxil   Vistaril   Zoloft   Lamictal   Depakote   Wellbutrin   Klonopin   Adderall   Valium   Ritalin   Remeron   Clonidine/Kapvay   Cymbalta   Neurontin   Effexor    Substance Abuse History in the last 12 months: Substance Age of 1st Use Last Use Amount Specific Type  Nicotine  13  2 weeks  dip    Alcohol  14  yesterday  two 12 oz  beer  Cannabis  14  years  Opiates  none        Cocaine  18  31      Methamphetamines  none        LSD  17  18      Ecstasy  none         Benzodiazepines  30's  last night    Valium  Caffeine  childhood  today  swallow  soft drink  Inhalants  none        Others:      Sugar  childhood  today    Medical Consequences of Substance Abuse: none Legal Consequences of Substance Abuse: none Family Consequences of Substance Abuse: none Blackouts:  No DT's:  No Withdrawal Symptoms:  No   Social History: Current Place of Residence: [address] [place] [address] Place of Birth: [place] Family Members: wife step son Marital Status:  Married Children: 3  Sons: 2  Daughters: 1 Relationships: wife Education:  dropped out in 11 th grade Educational Problems/Performance: LD and ADHD Religious Beliefs/Practices: none History of Abuse: none Occupational Experiences; none Military History:  Writer History: none Hobbies/Interests: fishing, watching sports, walking outside  Mental Status Examination/Evaluation: Objective:  Appearance: Casual  Eye Contact::  Good  Speech:  Clear and Coherent  Volume:  Normal  Mood:  Good   Affect:   Congruent  Thought Process:  Coherent, Intact and Logical  Orientation:  Full (Time, Place, and Person)  Thought Content:  no  Suicidal Thoughts:  No  Homicidal Thoughts:  No  Judgement:  Fair  Insight:  Fair  Psychomotor Activity:  Increased and Restlessness  Akathisia:  No  Handed:  Right  AIMS (if indicated):    Assets:  Communication Skills Desire for Improvement   Lab Results: No results found for this or any previous visit (from the past 2016 hour(s)).  Assessment:   AXIS I ADHD, inattentive type, Generalized Anxiety Disorder, Obsessive Compulsive Disorder, Panic Disorder and Tourette Syndrome  AXIS II Deferred  AXIS III Past Medical History  Diagnosis Date  . ADHD (attention deficit hyperactivity disorder)   . Anxiety   . Bipolar disorder (HCC)   . Depression   . Obsessive-compulsive disorder   . Psychosis   . Right sciatic nerve pain      AXIS IV other psychosocial or environmental problems  AXIS V 61-70 mild symptoms   Therapeutic Plan: Psychotherapy: problem solving  Medications: Adderall 30 mg 3 times a day, Valium 10 mg each bedtime as needed   Routine PRN Medications:  Negative  Consultations:   Safety Concerns:  none  Other:     Plan/Discussion: I took his vitals.  I reviewed CC, tobacco/med/surg Hx, meds effects/ side effects, problem list, therapies and responses as well as current situation/symptoms discussed options. He will continue his current dose of Adderall for ADHD. He will be prescribed Valium 10 mg each bedtime as needed for insomnia and return in 3 months. He has been strongly recommended to see his primary care physician regarding his blood pressure and also to stop smoking  See orders and pt instructions for more details.  MEDICATIONS this encounter: Meds ordered this encounter  Medications  . diazepam (VALIUM) 10 MG tablet    Sig: Take 1 tablet (10 mg total) by mouth at bedtime as needed for anxiety.    Dispense:  30 tablet    Refill:   2  . amphetamine-dextroamphetamine (ADDERALL) 30 MG tablet    Sig: Take 1 tablet by mouth  3 (three) times daily.    Dispense:  90 tablet    Refill:  0    Do not fill before 05/20/16  . amphetamine-dextroamphetamine (ADDERALL) 30 MG tablet    Sig: Take 1 tablet by mouth 3 (three) times daily.    Dispense:  90 tablet    Refill:  0    Do not fill unti 04/19/16  . amphetamine-dextroamphetamine (ADDERALL) 30 MG tablet    Sig: Take 1 tablet by mouth 3 (three) times daily.    Dispense:  90 tablet    Refill:  0    Medical Decision Making Problem Points:  Established problem, stable/improving (1), Established problem, worsening (2), Review of last therapy session (1) and Review of psycho-social stressors (1) Data Points:  Review or order clinical lab tests (1) Review of medication regiment & side effects (2) Review of new medications or change in dosage (2)  I certify that outpatient services furnished can reasonably be expected to improve the patient's condition.   [name], MD

## 2016-05-28 DIAGNOSIS — E782 Mixed hyperlipidemia: Secondary | ICD-10-CM | POA: Diagnosis not present

## 2016-05-29 DIAGNOSIS — M25569 Pain in unspecified knee: Secondary | ICD-10-CM | POA: Diagnosis not present

## 2016-05-29 DIAGNOSIS — Z0000 Encounter for general adult medical examination without abnormal findings: Secondary | ICD-10-CM | POA: Diagnosis not present

## 2016-05-29 DIAGNOSIS — F909 Attention-deficit hyperactivity disorder, unspecified type: Secondary | ICD-10-CM | POA: Diagnosis not present

## 2016-05-29 DIAGNOSIS — M25529 Pain in unspecified elbow: Secondary | ICD-10-CM | POA: Diagnosis not present

## 2016-05-29 DIAGNOSIS — H9209 Otalgia, unspecified ear: Secondary | ICD-10-CM | POA: Diagnosis not present

## 2016-05-29 DIAGNOSIS — L720 Epidermal cyst: Secondary | ICD-10-CM | POA: Diagnosis not present

## 2016-05-29 DIAGNOSIS — I10 Essential (primary) hypertension: Secondary | ICD-10-CM | POA: Diagnosis not present

## 2016-05-29 DIAGNOSIS — R945 Abnormal results of liver function studies: Secondary | ICD-10-CM | POA: Diagnosis not present

## 2016-06-20 ENCOUNTER — Ambulatory Visit (INDEPENDENT_AMBULATORY_CARE_PROVIDER_SITE_OTHER): Payer: 59 | Admitting: Psychiatry

## 2016-06-20 ENCOUNTER — Encounter (HOSPITAL_COMMUNITY): Payer: Self-pay | Admitting: Psychiatry

## 2016-06-20 VITALS — BP 157/94 | HR 57 | Ht 70.0 in | Wt 215.4 lb

## 2016-06-20 DIAGNOSIS — F411 Generalized anxiety disorder: Secondary | ICD-10-CM | POA: Diagnosis not present

## 2016-06-20 DIAGNOSIS — F952 Tourette's disorder: Secondary | ICD-10-CM

## 2016-06-20 DIAGNOSIS — F429 Obsessive-compulsive disorder, unspecified: Secondary | ICD-10-CM

## 2016-06-20 DIAGNOSIS — F902 Attention-deficit hyperactivity disorder, combined type: Secondary | ICD-10-CM

## 2016-06-20 MED ORDER — DIAZEPAM 10 MG PO TABS: 10.0000 mg | ORAL_TABLET | Freq: Every evening | ORAL | 2 refills | Status: DC | PRN

## 2016-06-20 MED ORDER — AMPHETAMINE-DEXTROAMPHETAMINE 30 MG PO TABS: 30.0000 mg | ORAL_TABLET | Freq: Three times a day (TID) | ORAL | 0 refills | Status: DC

## 2016-06-20 NOTE — Progress Notes (Signed)
Patient ID: Todd Gilbert, male   DOB: Dec 26, 1970, 45 y.o.   MRN: [id number] Patient ID: Todd Gilbert, male   DOB: Dec 26, 1970, 45 y.o.   MRN: [id number] Patient ID: Todd Gilbert, male   DOB: Dec 26, 1970, 45 y.o.   MRN: [id number] Patient ID: Todd Gilbert, male   DOB: Dec 26, 1970, 45 y.o.   MRN: [id number] Patient ID: Todd Gilbert, male   DOB: Dec 26, 1970, 45 y.o.   MRN: [id number] Patient ID: Todd Gilbert, male   DOB: Dec 26, 1970, 45 y.o.   MRN: [id number] Patient ID: Todd Gilbert, male   DOB: Dec 26, 1970, 45 y.o.   MRN: [id number] Patient ID: Todd Gilbert, male   DOB: Dec 26, 1970, 45 y.o.   MRN: [id number] Patient ID: Todd Gilbert, male   DOB: Dec 26, 1970, 45 y.o.   MRN: [id number] Patient ID: Todd Gilbert, male   DOB: Dec 26, 1970, 45 y.o.   MRN: [id number] Patient ID: Todd Gilbert, male   DOB: Dec 26, 1970, 45 y.o.   MRN: [id number] Patient ID: Todd LabrumJeremy W Gilbert, male   DOB: Dec 26, 1970, 45 y.o.   MRN: [id number] [facility] [id number] Progress Note Todd LabrumJeremy W Gilbert MRN: [id number] DOB: Dec 26, 1970 Age: 45 y.o.  Date: 06/20/2016 Start Time: 9:05 AM End Time: 9:30 AM  Chief Complaint: Chief Complaint  Patient presents with  . Anxiety  . ADD  . Follow-up   Subjective: "I'm doing well."  This patient is a 45 year old married white male lives with his wife in [place]. He has 3 children who live with his ex-wife in [place]. He works for a Administrator, [employer] company operating a forklift.  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 He was treated with Ritalin. He was "wild" as a teenager and young adult and dropped out of high school. He was in jail numerous times and used to drink and use drugs. Since he's been on Adderall he's been doing much better. He is much less impulsive and he able to stay focused at work. He is eating and sleeping well and doesn't endorse any symptoms of depression or anxiety. At times he is a bit irritable with his family.  he returns after 3 months. He  continues to do well. 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 His mood has been good and he is sleeping well with the Valium. He still staying quite focused. He has been put on Cozaar by his PCP but his blood pressure still somewhat high today. He is supposed to be reevaluated for this next week   History of Chief Complaint: Pt first sought help at age 89 and has been in treatment pretty much ever since then.   He has been identified as LD and never graduated from HS.  He kept getting into trouble and getting kicked out of schools.  He has struggled with anxiety and depression and tried on several medications listed below.  He found the best benefit from Adderall.  He comes seeking other meds that don't cost $130 a month.  Since starting Adderall he has not been in trouble with the law.   Anxiety      see above  Review of Systems Neuro: no headaches, ataxia, weakness GI: no N/V/D/cramps/constipation MS: no weakness, muscle cramps, aches. He notes some joint aches and could use chrondrotin sulfate with glucosamine.  Physical Exam Vitals: BP (!) 157/94 (BP Location:  Right Arm, Patient Position: Sitting, Cuff Size: Large)   Pulse (!) 57   Ht 5\' 10"  (1.778 m)   Wt 215 lb 6.4 oz (97.7 kg)   BMI 30.91 kg/m    Depressive Symptoms: depressed mood, insomnia, difficulty concentrating, suicidal thoughts without plan, anxiety, panic attacks,  (Hypo) Manic Symptoms:   Elevated Mood:  Negative Irritable Mood:  Yes Grandiosity:  history of Distractibility:  Yes Labiality of Mood:  Yes Delusions:  Yes Hallucinations:  Yes Impulsivity:  Yes Sexually Inappropriate Behavior:  No Financial Extravagance:  history of Flight of Ideas:  No  Anxiety Symptoms: Excessive Worry:  Yes Panic Symptoms:  Yes Agoraphobia:  Yes Obsessive Compulsive: Yes  Symptoms:  having things in perfect order and exactly right Specific Phobias:  No Social Anxiety:  Yes  Psychotic Symptoms:  Hallucinations: Yes Visual Delusions:  No Paranoia:  Yes   Ideas of Reference:  No  PTSD Symptoms: Ever had a traumatic exposure:  Yes Had a traumatic exposure in the last month:  No Re-experiencing: Yes Flashbacks Hypervigilance:  No Hyperarousal: No Difficulty Concentrating Avoidance: Yes Decreased Interest/Participation  Traumatic Brain Injury: Yes Blunt Trauma History of Loss of Consciousness:  Yes Seizure History:  No Cardiac History:  No  Past Psychiatric History: Diagnosis: ADHD, Bipolar disorder  Hospitalizations: adult twice  Outpatient Care: since age 20  Substance Abuse Care: adult twice  Self-Mutilation: none  Suicidal Attempts: none  Violent Behaviors: in the past   Allergies: Allergies  Allergen Reactions  . Lithium Other (See Comments)    Bizarre behavior at night, climbing out the window and bringing mail into the bedroom  . Tegretol [Carbamazepine] Other (See Comments)    Way out there VIVID dreams   Medical History: Past Medical History:  Diagnosis Date  . ADHD (attention deficit hyperactivity disorder)   . Anxiety   . Bipolar disorder (HCC)   . Depression   . Obsessive-compulsive disorder   . Psychosis   . Right sciatic nerve pain    Surgical History: Past Surgical History:  Procedure Laterality Date  . HAND TENDON SURGERY  10/13/1996   Family History: family history includes Alcohol abuse in his father; Anxiety disorder in his father; Dementia in his maternal grandmother. Reviewed and nothing new is noted today.  Current Medications:  Current Outpatient Prescriptions  Medication Sig Dispense Refill  . amphetamine-dextroamphetamine (ADDERALL) 30 MG tablet Take 1 tablet by mouth 3 (three) times daily. 90 tablet 0  . diazepam (VALIUM) 10 MG tablet Take 1 tablet (10 mg total) by mouth at bedtime as needed for anxiety. 30 tablet  2  . losartan (COZAAR) 50 MG tablet Take 50 mg by mouth daily.  2  . amphetamine-dextroamphetamine (ADDERALL) 30 MG tablet Take 1 tablet by mouth 3 (three) times daily. 90 tablet 0  . amphetamine-dextroamphetamine (ADDERALL) 30 MG tablet Take 1 tablet by mouth 3 (three) times daily. 90 tablet 0   No current facility-administered medications for this visit.     Previous Psychotropic Medications: Medication Dose   Paxil   Vistaril   Zoloft   Lamictal   Depakote   Wellbutrin   Klonopin   Adderall   Valium   Ritalin   Remeron   Clonidine/Kapvay   Cymbalta   Neurontin   Effexor    Substance Abuse History in the last 12 months: Substance Age of 1st Use Last Use Amount Specific Type  Nicotine  13  2 weeks  dip    Alcohol  14  yesterday  two 12 oz  beer  Cannabis  14  years      Opiates  none        Cocaine  [age]      Methamphetamines  none        LSD  17  18      Ecstasy  none         Benzodiazepines  30's  last night  5mg   Valium  Caffeine  childhood  today  swallow  soft drink  Inhalants  none        Others:      Sugar  childhood  today    Medical Consequences of Substance Abuse: none Legal Consequences of Substance Abuse: none Family Consequences of Substance Abuse: none Blackouts:  No DT's:  No Withdrawal Symptoms:  No   Social History: Current Place of Residence: [address] [place] [address] Place of Birth: [place] Family Members: wife step son Marital Status:  Married Children: 3  Sons: 2  Daughters: 1 Relationships: wife Education:  dropped out in 11 th grade Educational Problems/Performance: LD and ADHD Religious Beliefs/Practices: none History of Abuse: none Occupational Experiences; none Military History:  Writer History: none Hobbies/Interests: fishing, watching sports, walking outside  Mental Status Examination/Evaluation: Objective:  Appearance: Casual  Eye Contact::  Good  Speech:  Clear and Coherent  Volume:  Normal   Mood:  Good   Affect:  Congruent  Thought Process:  Coherent, Intact and Logical  Orientation:  Full (Time, Place, and Person)  Thought Content:  no  Suicidal Thoughts:  No  Homicidal Thoughts:  No  Judgement:  Fair  Insight:  Fair  Psychomotor Activity:  Increased and Restlessness  Akathisia:  No  Handed:  Right  AIMS (if indicated):    Assets:  Communication Skills Desire for Improvement   Lab Results: No results found for this or any previous visit (from the past 2016 hour(s)).  Assessment:   AXIS I ADHD, inattentive type, Generalized Anxiety Disorder, Obsessive Compulsive Disorder, Panic Disorder and Tourette Syndrome  AXIS II Deferred  AXIS III Past Medical History:  Diagnosis Date  . ADHD (attention deficit hyperactivity disorder)   . Anxiety   . Bipolar disorder (HCC)   . Depression   . Obsessive-compulsive disorder   . Psychosis   . Right sciatic nerve pain      AXIS IV other psychosocial or environmental problems  AXIS V 61-70 mild symptoms   Therapeutic Plan: Psychotherapy: problem solving  Medications: Adderall 30 mg 3 times a day, Valium 10 mg each bedtime as needed   Routine PRN Medications:  Negative  Consultations: Today to work with PCP on reducing his blood pressure and quitting smoking   Safety Concerns:  none  Other:     Plan/Discussion: I took his vitals.  I reviewed CC, tobacco/med/surg Hx, meds effects/ side effects, problem list, therapies and responses as well as current situation/symptoms discussed options. He will continue his current dose of Adderall for ADHD. He will be prescribed Valium 10 mg each bedtime as needed for insomnia and return in 3 months. He has been strongly recommended to see his primary care physician regarding his blood pressure and also to stop smoking  See orders and pt instructions for more details.  MEDICATIONS this encounter: Meds ordered this encounter  Medications  . losartan (COZAAR) 50 MG tablet    Sig: Take  50 mg by mouth daily.    Refill:  2  .  diazepam (VALIUM) 10 MG tablet    Sig: Take 1 tablet (10 mg total) by mouth at bedtime as needed for anxiety.    Dispense:  30 tablet    Refill:  2  . amphetamine-dextroamphetamine (ADDERALL) 30 MG tablet    Sig: Take 1 tablet by mouth 3 (three) times daily.    Dispense:  90 tablet    Refill:  0    Do not fill before 07/20/16  . amphetamine-dextroamphetamine (ADDERALL) 30 MG tablet    Sig: Take 1 tablet by mouth 3 (three) times daily.    Dispense:  90 tablet    Refill:  0    Do not fill unti 08/20/16  . amphetamine-dextroamphetamine (ADDERALL) 30 MG tablet    Sig: Take 1 tablet by mouth 3 (three) times daily.    Dispense:  90 tablet    Refill:  0    Medical Decision Making Problem Points:  Established problem, stable/improving (1), Established problem, worsening (2), Review of last therapy session (1) and Review of psycho-social stressors (1) Data Points:  Review or order clinical lab tests (1) Review of medication regiment & side effects (2) Review of new medications or change in dosage (2)  I certify that outpatient services furnished can reasonably be expected to improve the patient's condition.   [name], MD

## 2016-09-19 ENCOUNTER — Encounter (HOSPITAL_COMMUNITY): Payer: Self-pay | Admitting: Psychiatry

## 2016-09-19 ENCOUNTER — Ambulatory Visit (INDEPENDENT_AMBULATORY_CARE_PROVIDER_SITE_OTHER): Payer: 59 | Admitting: Psychiatry

## 2016-09-19 VITALS — BP 160/102 | HR 64 | Ht 70.0 in | Wt 223.8 lb

## 2016-09-19 DIAGNOSIS — Z79899 Other long term (current) drug therapy: Secondary | ICD-10-CM

## 2016-09-19 DIAGNOSIS — Z888 Allergy status to other drugs, medicaments and biological substances status: Secondary | ICD-10-CM | POA: Diagnosis not present

## 2016-09-19 DIAGNOSIS — F902 Attention-deficit hyperactivity disorder, combined type: Secondary | ICD-10-CM

## 2016-09-19 MED ORDER — AMPHETAMINE-DEXTROAMPHETAMINE 30 MG PO TABS: 30.0000 mg | ORAL_TABLET | Freq: Three times a day (TID) | ORAL | 0 refills | Status: DC

## 2016-09-19 MED ORDER — DIAZEPAM 10 MG PO TABS: 10.0000 mg | ORAL_TABLET | Freq: Every evening | ORAL | 2 refills | Status: DC | PRN

## 2016-09-19 NOTE — Progress Notes (Signed)
Patient ID: Todd Gilbert, male   DOB: [birth year], 45 y.o.   MRN: [id number] Patient ID: Todd Gilbert, male   DOB: [birth year], 45 y.o.   MRN: [id number] Patient ID: Todd Gilbert, male   DOB: 05/11/1971, 45 y.o.   MRN: [id number] Patient ID: Todd Gilbert, male   DOB: 08/03/1971, 45 y.o.   MRN: [id number] Patient ID: Todd Gilbert, male   DOB: [birth year], 45 y.o.   MRN: [id number] Patient ID: Todd Gilbert, male   DOB: [birth year], 45 y.o.   MRN: [id number] Patient ID: Todd Gilbert, male   DOB: Apr 03, 1971, 45 y.o.   MRN: [id number] Patient ID: Todd Gilbert, male   DOB: 11/06/1970, 45 y.o.   MRN: [id number] Patient ID: Todd Gilbert, male   DOB: Nov 30, 1970, 45 y.o.   MRN: [id number] Patient ID: Todd Gilbert, male   DOB: Jan 27, 1971, 45 y.o.   MRN: [id number] Patient ID: Todd Gilbert, male   DOB: 1971-10-12, 45 y.o.   MRN: [id number] Patient ID: Todd Gilbert, male   DOB: 01-Aug-1971, 45 y.o.   MRN: [id number] [facility] [address] Progress Note Todd Gilbert MRN: [id number] DOB: 1971/06/13 Age: 45 y.o.  Date: 09/19/2016 Start Time: 9:05 AM End Time: 9:30 AM  Chief Complaint: Chief Complaint  Patient presents with  . ADD   Subjective: "I'm doing well."  This patient is a 45 year old married white male lives with his wife in [place]. He has 3 children who live with his ex-wife in [place]. He works for a Administrator, arts.  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 He was treated with Ritalin. He was "wild" as a teenager and young adult and dropped out of high school. He was in jail numerous times and used to drink and use drugs. Since he's been on Adderall he's been doing much better. He is much less impulsive and he able to stay focused at work. He is eating and sleeping well and doesn't endorse any symptoms of depression or anxiety. At times he is a bit irritable with his family.  he returns after 3 months. He continues to do well. 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 His blood pressure runs high again and he has not had his blood pressure medicine for about 2 weeks is going to pick it up today. He is staying well focused in the Valium continues to help him sleep   History of Chief Complaint: Pt first sought help at age 8 and has been in treatment pretty much ever since then.   He has been identified as LD and never graduated from HS.  He kept getting into trouble and getting kicked out of schools.  He has struggled with anxiety and depression and tried on several medications listed below.  He found the best benefit from Adderall.  He Gilbert seeking other meds that don't cost $130 a month.  Since starting Adderall he has not been in trouble with the law.   Anxiety      see above  Review of Systems Neuro: no headaches, ataxia, weakness GI: no N/V/D/cramps/constipation MS: no weakness, muscle cramps, aches. He notes some joint aches and could use chrondrotin sulfate with glucosamine.  Physical Exam Vitals: BP (!) 160/102 (BP Location: Right Arm, Patient Position: Sitting, Cuff Size: Large)   Pulse 64   Ht 5\' 10"  (  1.778 m)   Wt 223 lb 12.8 oz (101.5 kg)   BMI 32.11 kg/m    Depressive Symptoms: depressed mood, insomnia, difficulty concentrating, suicidal thoughts without plan, anxiety, panic attacks,  (Hypo) Manic Symptoms:   Elevated Mood:  Negative Irritable Mood:  Yes Grandiosity:  history of Distractibility:  Yes Labiality of Mood:  Yes Delusions:  Yes Hallucinations:  Yes Impulsivity:  Yes Sexually Inappropriate Behavior:  No Financial Extravagance:  history of Flight of Ideas:  No  Anxiety Symptoms: Excessive Worry:  Yes Panic Symptoms:  Yes Agoraphobia:  Yes Obsessive Compulsive: Yes  Symptoms: having things in perfect order and exactly right Specific Phobias:  No Social Anxiety:  Yes  Psychotic  Symptoms:  Hallucinations: Yes Visual Delusions:  No Paranoia:  Yes   Ideas of Reference:  No  PTSD Symptoms: Ever had a traumatic exposure:  Yes Had a traumatic exposure in the last month:  No Re-experiencing: Yes Flashbacks Hypervigilance:  No Hyperarousal: No Difficulty Concentrating Avoidance: Yes Decreased Interest/Participation  Traumatic Brain Injury: Yes Blunt Trauma History of Loss of Consciousness:  Yes Seizure History:  No Cardiac History:  No  Past Psychiatric History: Diagnosis: ADHD, Bipolar disorder  Hospitalizations: adult twice  Outpatient Care: since age 45  Substance Abuse Care: adult twice  Self-Mutilation: none  Suicidal Attempts: none  Violent Behaviors: in the past   Allergies: Allergies  Allergen Reactions  . Lithium Other (See Comments)    Bizarre behavior at night, climbing out the window and bringing mail into the bedroom  . Tegretol [Carbamazepine] Other (See Comments)    Way out there VIVID dreams   Medical History: Past Medical History:  Diagnosis Date  . ADHD (attention deficit hyperactivity disorder)   . Anxiety   . Bipolar disorder (HCC)   . Depression   . Obsessive-compulsive disorder   . Psychosis   . Right sciatic nerve pain    Surgical History: Past Surgical History:  Procedure Laterality Date  . HAND TENDON SURGERY  10/13/1996   Family History: family history includes Alcohol abuse in his father; Anxiety disorder in his father; Dementia in his maternal grandmother. Reviewed and nothing new is noted today.  Current Medications:  Current Outpatient Prescriptions  Medication Sig Dispense Refill  . amphetamine-dextroamphetamine (ADDERALL) 30 MG tablet Take 1 tablet by mouth 3 (three) times daily. 90 tablet 0  . diazepam (VALIUM) 10 MG tablet Take 1 tablet (10 mg total) by mouth at bedtime as needed for anxiety. 30 tablet 2  . losartan (COZAAR) 50 MG tablet Take 50 mg by mouth daily.  2  . amphetamine-dextroamphetamine  (ADDERALL) 30 MG tablet Take 1 tablet by mouth 3 (three) times daily. 90 tablet 0  . amphetamine-dextroamphetamine (ADDERALL) 30 MG tablet Take 1 tablet by mouth 3 (three) times daily. 90 tablet 0   No current facility-administered medications for this visit.     Previous Psychotropic Medications: Medication Dose   Paxil   Vistaril   Zoloft   Lamictal   Depakote   Wellbutrin   Klonopin   Adderall   Valium   Ritalin   Remeron   Clonidine/Kapvay   Cymbalta   Neurontin   Effexor    Substance Abuse History in the last 12 months: Substance Age of 1st Use Last Use Amount Specific Type  Nicotine  13  2 weeks  dip    Alcohol  14  yesterday  two 12 oz  beer  Cannabis  14  years      Opiates  none        Cocaine  18  31      Methamphetamines  none        LSD  17  18      Ecstasy  none         Benzodiazepines  30's  last night  5mg   Valium  Caffeine  childhood  today  swallow  soft drink  Inhalants  none        Others:      Sugar  childhood  today    Medical Consequences of Substance Abuse: none Legal Consequences of Substance Abuse: none Family Consequences of Substance Abuse: none Blackouts:  No DT's:  No Withdrawal Symptoms:  No   Social History: Current Place of Residence: [address] [place] [address] Place of Birth: [place] Family Members: wife step son Marital Status:  Married Children: 3  Sons: 2  Daughters: 1 Relationships: wife Education:  dropped out in 11 th grade Educational Problems/Performance: LD and ADHD Religious Beliefs/Practices: none History of Abuse: none Occupational Experiences; none Military History:  Writer History: none Hobbies/Interests: fishing, watching sports, walking outside  Mental Status Examination/Evaluation: Objective:  Appearance: Casual  Eye Contact::  Good  Speech:  Clear and Coherent  Volume:  Normal  Mood:  Good   Affect:  Congruent  Thought Process:  Coherent, Intact and Logical  Orientation:  Full  (Time, Place, and Person)  Thought Content:  no  Suicidal Thoughts:  No  Homicidal Thoughts:  No  Judgement:  Fair  Insight:  Fair  Psychomotor Activity:  Increased and Restlessness  Akathisia:  No  Handed:  Right  AIMS (if indicated):    Assets:  Communication Skills Desire for Improvement   Lab Results: No results found for this or any previous visit (from the past 2016 hour(s)).  Assessment:   AXIS I ADHD, inattentive type, Generalized Anxiety Disorder, Obsessive Compulsive Disorder, Panic Disorder and Tourette Syndrome  AXIS II Deferred  AXIS III Past Medical History:  Diagnosis Date  . ADHD (attention deficit hyperactivity disorder)   . Anxiety   . Bipolar disorder (HCC)   . Depression   . Obsessive-compulsive disorder   . Psychosis   . Right sciatic nerve pain      AXIS IV other psychosocial or environmental problems  AXIS V 61-70 mild symptoms   Therapeutic Plan: Psychotherapy: problem solving  Medications: Adderall 30 mg 3 times a day, Valium 10 mg each bedtime as needed   Routine PRN Medications:  Negative  Consultations: Today to work with PCP on reducing his blood pressure and quitting smoking   Safety Concerns:  none  Other:     Plan/Discussion: I took his vitals.  I reviewed CC, tobacco/med/surg Hx, meds effects/ side effects, problem list, therapies and responses as well as current situation/symptoms discussed options. He will continue his current dose of Adderall for ADHD. He will be prescribed Valium 10 mg each bedtime as needed for insomnia and return in 3 months. He has been strongly recommended to see his primary care physician regarding his blood pressure and also to stop smoking  See orders and pt instructions for more details.  MEDICATIONS this encounter: Meds ordered this encounter  Medications  . diazepam (VALIUM) 10 MG tablet    Sig: Take 1 tablet (10 mg total) by mouth at bedtime as needed for anxiety.    Dispense:  30 tablet    Refill:  2   . amphetamine-dextroamphetamine (ADDERALL) 30  MG tablet    Sig: Take 1 tablet by mouth 3 (three) times daily.    Dispense:  90 tablet    Refill:  0    Do not fill before 10/19/16  . amphetamine-dextroamphetamine (ADDERALL) 30 MG tablet    Sig: Take 1 tablet by mouth 3 (three) times daily.    Dispense:  90 tablet    Refill:  0    Do not fill unti 11/19/16  . amphetamine-dextroamphetamine (ADDERALL) 30 MG tablet    Sig: Take 1 tablet by mouth 3 (three) times daily.    Dispense:  90 tablet    Refill:  0    Medical Decision Making Problem Points:  Established problem, stable/improving (1), Established problem, worsening (2), Review of last therapy session (1) and Review of psycho-social stressors (1) Data Points:  Review or order clinical lab tests (1) Review of medication regiment & side effects (2) Review of new medications or change in dosage (2)  I certify that outpatient services furnished can reasonably be expected to improve the patient's condition.   [name], MD               Patient ID: Todd BONTEMPO, male   DOB: Aug 23, 1971, 45 y.o.   MRN: [id number] Patient ID: HAWARD POPE, male   DOB: 21-Sep-1971, 45 y.o.   MRN: [id number] Patient ID: NAPHTALI ZYWICKI, male   DOB: 12-25-1970, 45 y.o.   MRN: [id number] Patient ID: OSAMU OLGUIN, male   DOB: 07-17-1971, 45 y.o.   MRN: [id number] Patient ID: GARIK DIAMANT, male   DOB: 02-13-1971, 45 y.o.   MRN: [id number] Patient ID: KACIN DANCY, male   DOB: 03/03/1971, 45 y.o.   MRN: [id number] Patient ID: Jonothan DITULLIO, male   DOB: [birth year], 45 y.o.   MRN: [id number] Patient ID: MAXTON NOREEN, male   DOB: 05-Aug-1971, 45 y.o.   MRN: [id number] Patient ID: BURNETT SPRAY, male   DOB: 09-05-1971, 45 y.o.   MRN: [id number] Patient ID: RICHARDS PHERIGO, male   DOB: December 01, 1970, 45 y.o.   MRN: [id number] Patient ID: CHANEY MACLAREN, male   DOB: [birth year], 45 y.o.   MRN: [id number] Patient ID: POPE BRUNTY, male   DOB:  1970/12/13, 45 y.o.   MRN: [id number] [facility] [address] Progress Note SHAILEN THIELEN MRN: [id number] DOB: [birth year] Age: 45 y.o.  Date: 09/19/2016 Start Time: 9:05 AM End Time: 9:30 AM  Chief Complaint: Chief Complaint  Patient presents with  . ADD   Subjective: "I'm doing well."  This patient is a 45 year old married white male lives with his wife in [place]. He has 3 children who live with his ex-wife in [place]. He works for a Administrator, arts.  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 He was treated with Ritalin. He was "wild" as a teenager and young adult and dropped out of high school. He was in jail numerous times and used to drink and use drugs. Since he's been on Adderall he's been doing much better. He is much less impulsive and he able to stay focused at work. He is eating and sleeping well and doesn't endorse any symptoms of depression or anxiety. At times he is a bit irritable with his family.  he returns after 3 months. He continues to do well. 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 His mood has been good and he is sleeping well with the Valium. He still staying quite focused. He has been put on Cozaar by his PCP but his blood pressure still somewhat high today. He is supposed to be reevaluated for this next week   History of Chief Complaint: Pt first sought help at age 89 and has been in treatment pretty much ever since then.   He has been identified as LD and never graduated from HS.  He kept getting into trouble and getting kicked out of schools.  He has struggled with anxiety and depression and tried on several medications listed below.  He found the best benefit from Adderall.  He Gilbert seeking other meds that don't cost $130 a month.  Since starting Adderall he has not been in trouble with the law.    Anxiety      see above  Review of Systems Neuro: no headaches, ataxia, weakness GI: no N/V/D/cramps/constipation MS: no weakness, muscle cramps, aches. He notes some joint aches and could use chrondrotin sulfate with glucosamine.  Physical Exam Vitals: BP (!) 160/102 (BP Location: Right Arm, Patient Position: Sitting, Cuff Size: Large)   Pulse 64   Ht 5\' 10"  (1.778 m)   Wt 223 lb 12.8 oz (101.5 kg)   BMI 32.11 kg/m    Depressive Symptoms: depressed mood, insomnia, difficulty concentrating, suicidal thoughts without plan, anxiety, panic attacks,  (Hypo) Manic Symptoms:   Elevated Mood:  Negative Irritable Mood:  Yes Grandiosity:  history of Distractibility:  Yes Labiality of Mood:  Yes Delusions:  Yes Hallucinations:  Yes Impulsivity:  Yes Sexually Inappropriate Behavior:  No Financial Extravagance:  history of Flight of Ideas:  No  Anxiety Symptoms: Excessive Worry:  Yes Panic Symptoms:  Yes Agoraphobia:  Yes Obsessive Compulsive: Yes  Symptoms: having things in perfect order and exactly right Specific Phobias:  No Social Anxiety:  Yes  Psychotic Symptoms:  Hallucinations: Yes Visual Delusions:  No Paranoia:  Yes   Ideas of Reference:  No  PTSD Symptoms: Ever had a traumatic exposure:  Yes Had a traumatic exposure in the last month:  No Re-experiencing: Yes Flashbacks Hypervigilance:  No Hyperarousal: No Difficulty Concentrating Avoidance: Yes Decreased Interest/Participation  Traumatic Brain Injury: Yes Blunt Trauma History of Loss of Consciousness:  Yes Seizure History:  No Cardiac History:  No  Past Psychiatric History: Diagnosis: ADHD, Bipolar disorder  Hospitalizations: adult twice  Outpatient Care: since age 89  Substance Abuse Care: adult twice  Self-Mutilation: none  Suicidal Attempts: none  Violent Behaviors: in the past   Allergies: Allergies  Allergen Reactions  . Lithium Other (See Comments)    Bizarre behavior at night,  climbing out the window and bringing mail into the bedroom  . Tegretol [Carbamazepine] Other (See Comments)    Way out there VIVID dreams   Medical History: Past Medical History:  Diagnosis Date  . ADHD (attention deficit hyperactivity disorder)   . Anxiety   . Bipolar disorder (HCC)   . Depression   . Obsessive-compulsive disorder   . Psychosis   . Right sciatic nerve pain    Surgical History: Past Surgical History:  Procedure Laterality Date  . HAND TENDON SURGERY  10/13/1996   Family History: family history includes Alcohol abuse in his father; Anxiety disorder in his father; Dementia in his maternal grandmother. Reviewed and nothing new is noted today.  Current Medications:  Current Outpatient Prescriptions  Medication Sig Dispense Refill  . amphetamine-dextroamphetamine (  ADDERALL) 30 MG tablet Take 1 tablet by mouth 3 (three) times daily. 90 tablet 0  . diazepam (VALIUM) 10 MG tablet Take 1 tablet (10 mg total) by mouth at bedtime as needed for anxiety. 30 tablet 2  . losartan (COZAAR) 50 MG tablet Take 50 mg by mouth daily.  2  . amphetamine-dextroamphetamine (ADDERALL) 30 MG tablet Take 1 tablet by mouth 3 (three) times daily. 90 tablet 0  . amphetamine-dextroamphetamine (ADDERALL) 30 MG tablet Take 1 tablet by mouth 3 (three) times daily. 90 tablet 0   No current facility-administered medications for this visit.     Previous Psychotropic Medications: Medication Dose   Paxil   Vistaril   Zoloft   Lamictal   Depakote   Wellbutrin   Klonopin   Adderall   Valium   Ritalin   Remeron   Clonidine/Kapvay   Cymbalta   Neurontin   Effexor    Substance Abuse History in the last 12 months: Substance Age of 1st Use Last Use Amount Specific Type  Nicotine  13  2 weeks  dip    Alcohol  14  yesterday  two 12 oz  beer  Cannabis  14  years      Opiates  none        Cocaine  18  31      Methamphetamines  none        LSD  17  18      Ecstasy  none          Benzodiazepines  30's  last night  5mg   Valium  Caffeine  childhood  today  swallow  soft drink  Inhalants  none        Others:      Sugar  childhood  today    Medical Consequences of Substance Abuse: none Legal Consequences of Substance Abuse: none Family Consequences of Substance Abuse: none Blackouts:  No DT's:  No Withdrawal Symptoms:  No   Social History: Current Place of Residence: [address] [place] [id number] Place [place] Birth: [place] Family Members: wife step son Marital Status:  Married Children: 3  Sons: 2  Daughters: 1 Relationships: wife Education:  dropped out in 11 th grade Educational Problems/Performance: LD and ADHD Religious Beliefs/Practices: none History of Abuse: none Occupational Experiences; none Military History:  [employer] Legal History: none Hobbies/Interests: fishing, watching sports, walking outside  Mental Status Examination/Evaluation: Objective:  Appearance: Casual  Eye Contact::  Good  Speech:  Clear and Coherent  Volume:  Normal  Mood:  Good   Affect:  Congruent  Thought Process:  Coherent, Intact and Logical  Orientation:  Full (Time, Place, and Person)  Thought Content:  no  Suicidal Thoughts:  No  Homicidal Thoughts:  No  Judgement:  Fair  Insight:  Fair  Psychomotor Activity:  Increased and Restlessness  Akathisia:  No  Handed:  Right  AIMS (if indicated):    Assets:  Communication Skills Desire for Improvement   Lab Results: No results found for this or any previous visit (from the past 2016 hour(s)).  Assessment:   AXIS I ADHD, inattentive type, Generalized Anxiety Disorder, Obsessive Compulsive Disorder, Panic Disorder and Tourette Syndrome  AXIS II Deferred  AXIS III Past Medical History:  Diagnosis Date  . ADHD (attention deficit hyperactivity disorder)   . Anxiety   . Bipolar disorder (HCC)   . Depression   . Obsessive-compulsive disorder   . Psychosis   . Right sciatic nerve pain  AXIS IV other  psychosocial or environmental problems  AXIS V 61-70 mild symptoms   Therapeutic Plan: Psychotherapy: problem solving  Medications: Adderall 30 mg 3 times a day, Valium 10 mg each bedtime as needed   Routine PRN Medications:  Negative  Consultations: Today to work with PCP on reducing his blood pressure and quitting smoking   Safety Concerns:  none  Other:     Plan/Discussion: I took his vitals.  I reviewed CC, tobacco/med/surg Hx, meds effects/ side effects, problem list, therapies and responses as well as current situation/symptoms discussed options. He will continue his current dose of Adderall for ADHD. He will be prescribed Valium 10 mg each bedtime as needed for insomnia and return in 3 months. He has been strongly recommended to see his primary care physician regarding his blood pressure and also to stop smoking  See orders and pt instructions for more details.  MEDICATIONS this encounter: Meds ordered this encounter  Medications  . diazepam (VALIUM) 10 MG tablet    Sig: Take 1 tablet (10 mg total) by mouth at bedtime as needed for anxiety.    Dispense:  30 tablet    Refill:  2  . amphetamine-dextroamphetamine (ADDERALL) 30 MG tablet    Sig: Take 1 tablet by mouth 3 (three) times daily.    Dispense:  90 tablet    Refill:  0    Do not fill before 10/19/16  . amphetamine-dextroamphetamine (ADDERALL) 30 MG tablet    Sig: Take 1 tablet by mouth 3 (three) times daily.    Dispense:  90 tablet    Refill:  0    Do not fill unti 11/19/16  . amphetamine-dextroamphetamine (ADDERALL) 30 MG tablet    Sig: Take 1 tablet by mouth 3 (three) times daily.    Dispense:  90 tablet    Refill:  0    Medical Decision Making Problem Points:  Established problem, stable/improving (1), Established problem, worsening (2), Review of last therapy session (1) and Review of psycho-social stressors (1) Data Points:  Review or order clinical lab tests (1) Review of medication regiment & side effects  (2) Review of new medications or change in dosage (2)  I certify that outpatient services furnished can reasonably be expected to improve the patient's condition.   [name], MD

## 2016-11-20 MED FILL — AMPHETAMINE SALTS 30 MG TAB: 30 | 30 days supply | Qty: 90 | Fill #0

## 2016-11-26 IMAGING — DX DG SHOULDER 2+V*L*
3 series · 3 of 3 positions shown · non-contrast
Comparison: None.

CLINICAL DATA: Fell 2 weeks ago with left shoulder pain

EXAM:
LEFT SHOULDER - 2+ VIEW

[shoulder grashey]
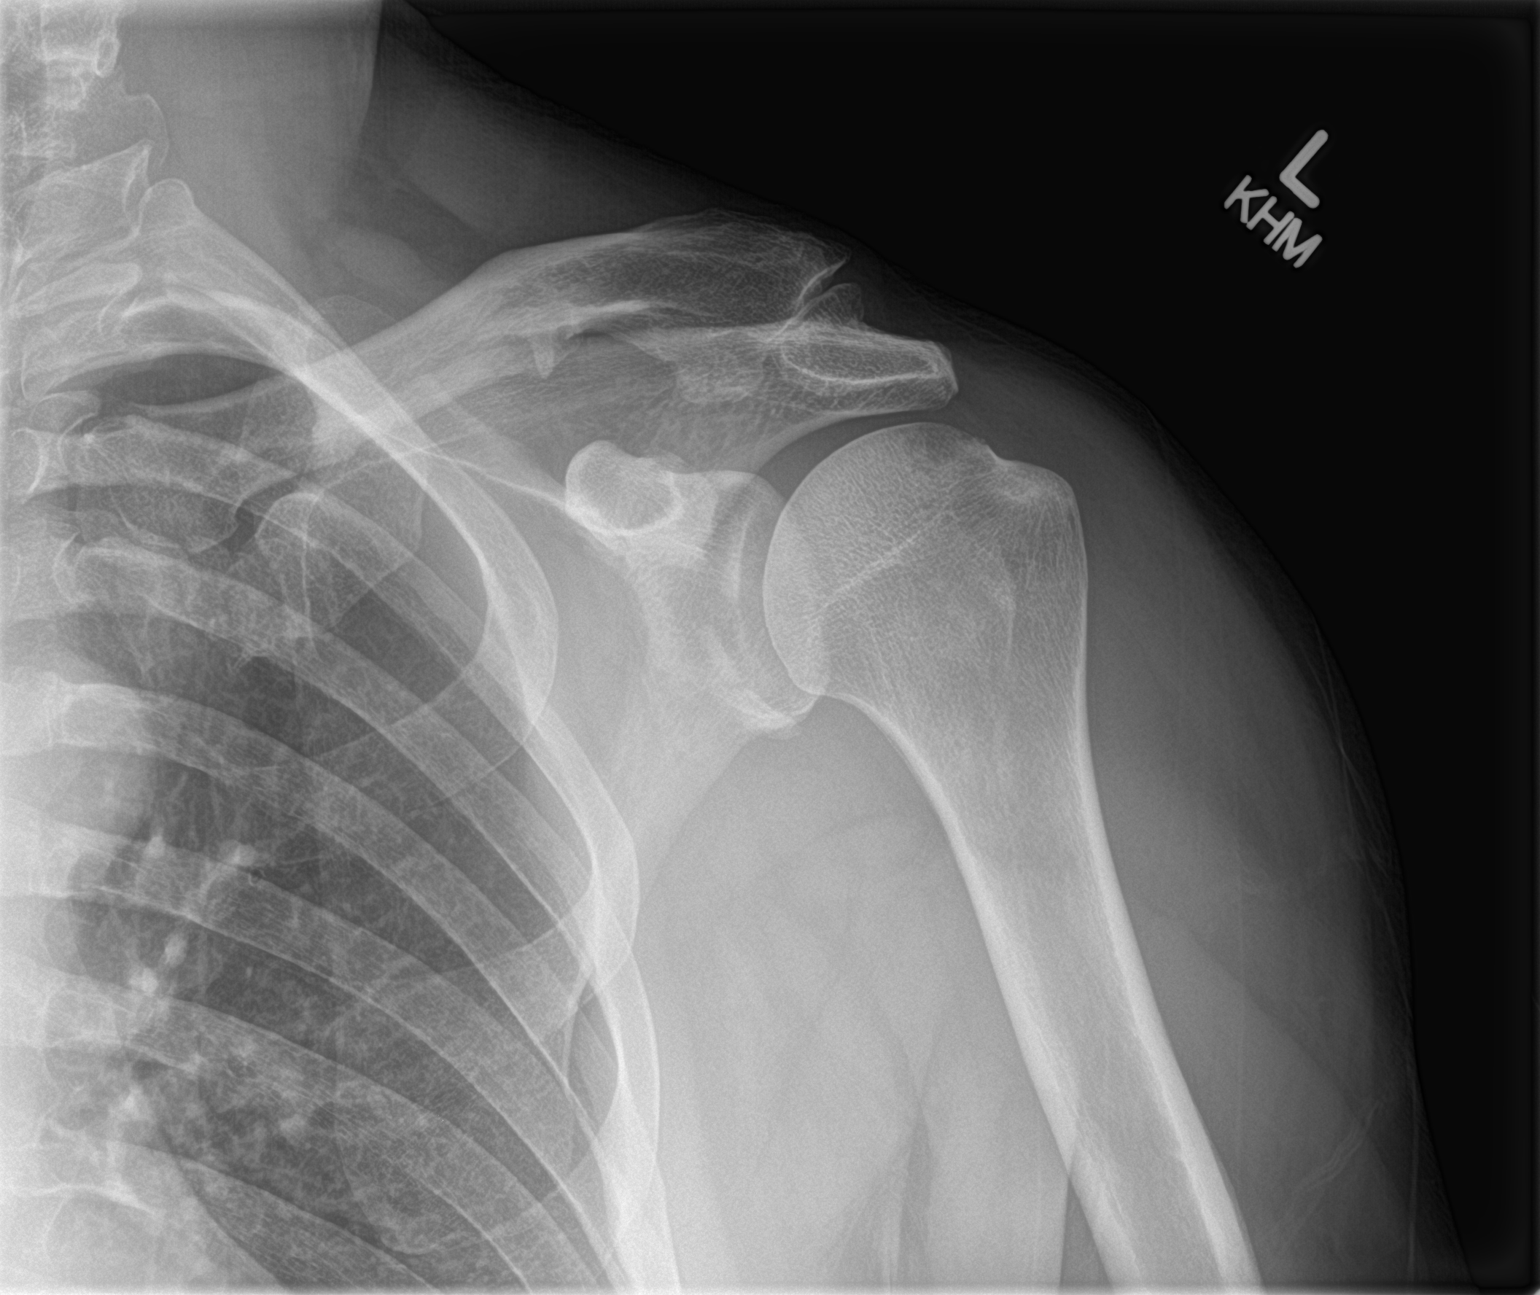

[shoulder y view]
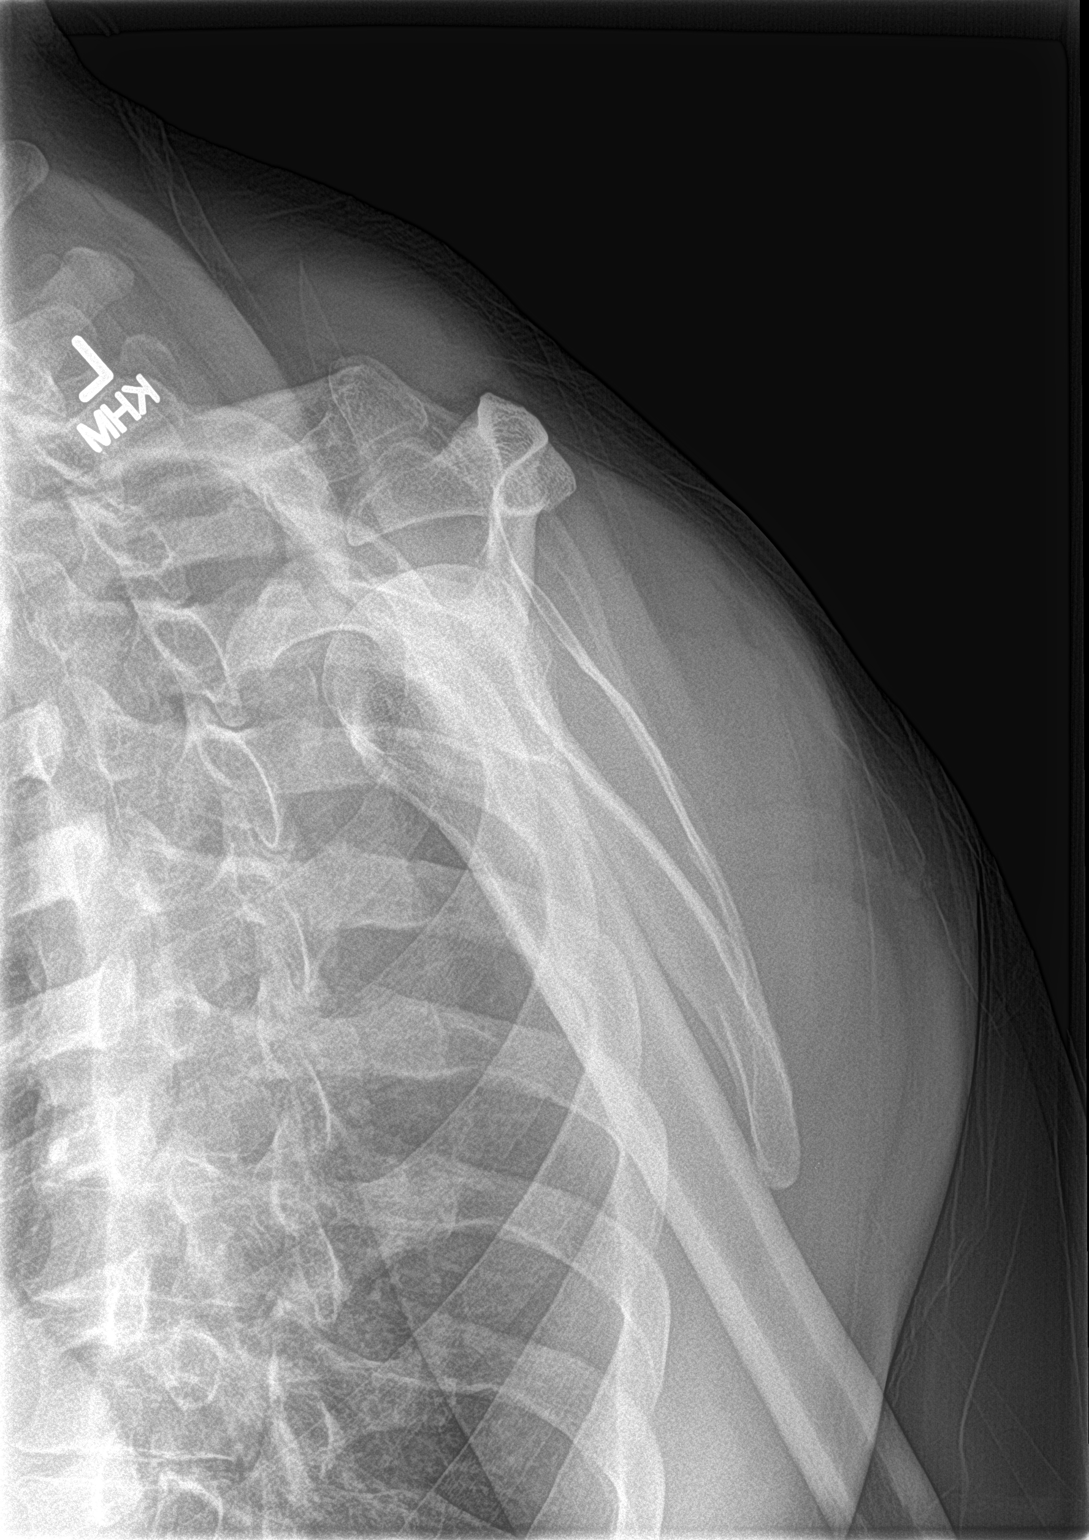

[shoulder ap neutral]
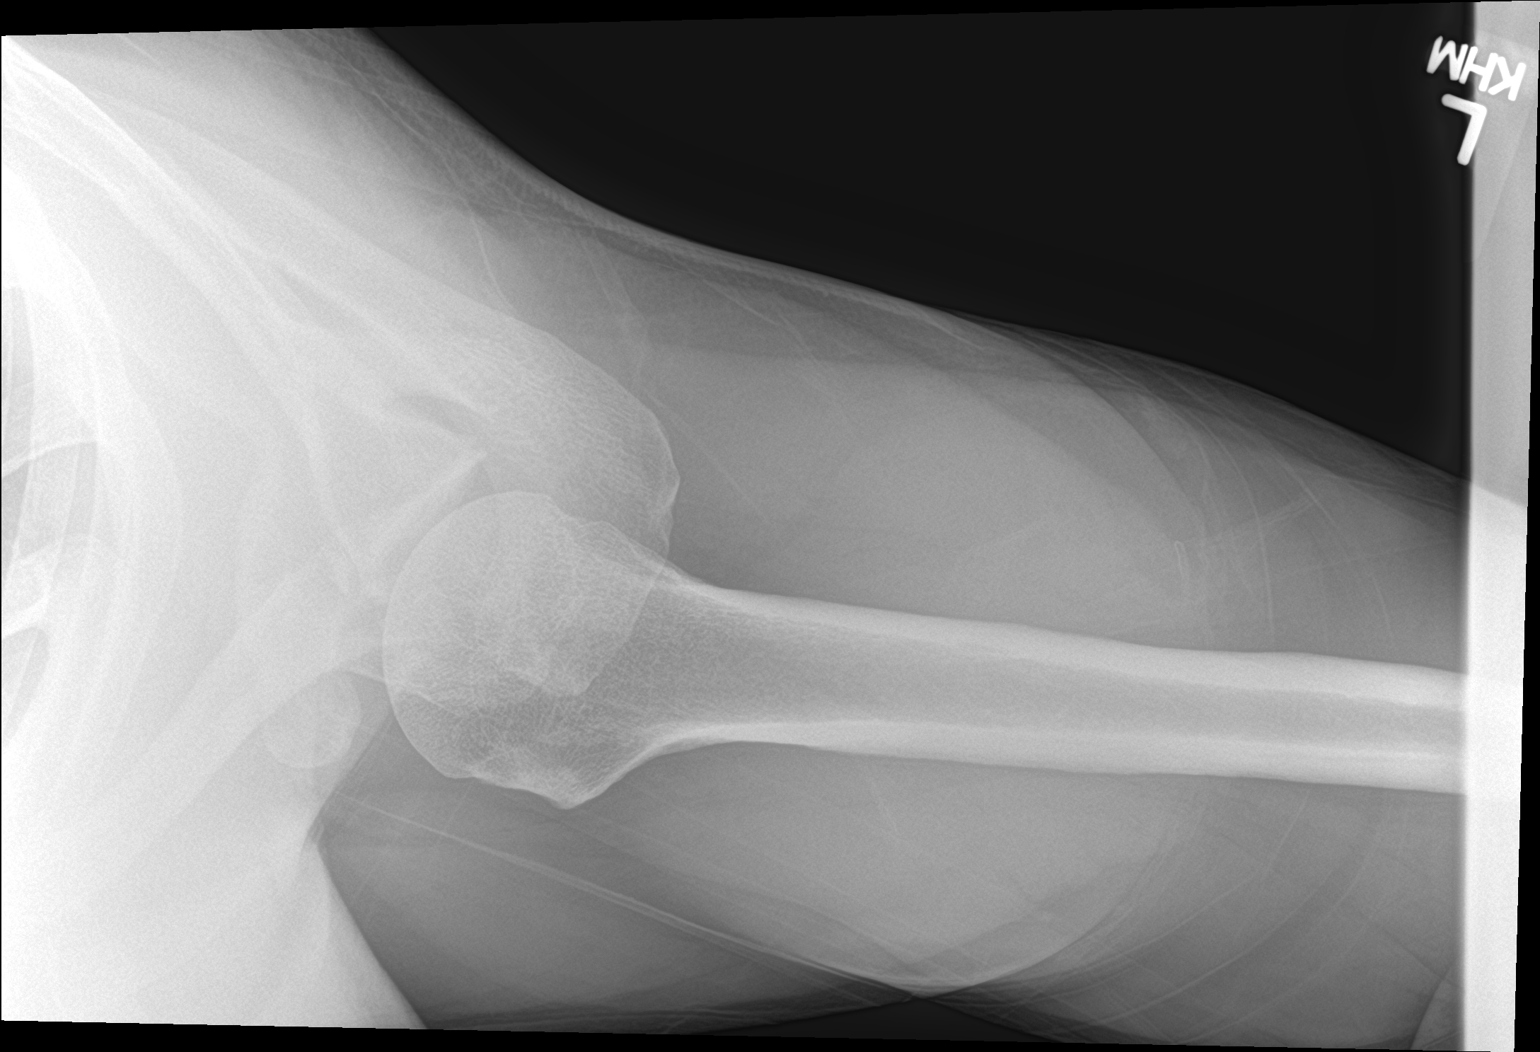

[3 of 3 positions shown; findings below may reference images not displayed]

FINDINGS: The left glenohumeral joint space appears normal and the left
humeral head is normally positioned. There is mild degenerative
change of the left AC joint. No acute abnormality is seen.
IMPRESSION: No acute abnormality. Mild degenerative change of the left AC joint.

## 2016-12-17 ENCOUNTER — Telehealth (HOSPITAL_COMMUNITY): Payer: Self-pay | Admitting: *Deleted

## 2016-12-17 ENCOUNTER — Encounter (HOSPITAL_COMMUNITY): Payer: Self-pay | Admitting: Psychiatry

## 2016-12-17 ENCOUNTER — Ambulatory Visit (INDEPENDENT_AMBULATORY_CARE_PROVIDER_SITE_OTHER): Payer: 59 | Admitting: Psychiatry

## 2016-12-17 DIAGNOSIS — F411 Generalized anxiety disorder: Secondary | ICD-10-CM | POA: Diagnosis not present

## 2016-12-17 DIAGNOSIS — Z888 Allergy status to other drugs, medicaments and biological substances status: Secondary | ICD-10-CM

## 2016-12-17 DIAGNOSIS — F410 Panic disorder [episodic paroxysmal anxiety] without agoraphobia: Secondary | ICD-10-CM

## 2016-12-17 DIAGNOSIS — Z811 Family history of alcohol abuse and dependence: Secondary | ICD-10-CM

## 2016-12-17 DIAGNOSIS — F952 Tourette's disorder: Secondary | ICD-10-CM | POA: Diagnosis not present

## 2016-12-17 DIAGNOSIS — F902 Attention-deficit hyperactivity disorder, combined type: Secondary | ICD-10-CM | POA: Diagnosis not present

## 2016-12-17 DIAGNOSIS — Z818 Family history of other mental and behavioral disorders: Secondary | ICD-10-CM | POA: Diagnosis not present

## 2016-12-17 DIAGNOSIS — F429 Obsessive-compulsive disorder, unspecified: Secondary | ICD-10-CM | POA: Diagnosis not present

## 2016-12-17 DIAGNOSIS — Z810 Family history of intellectual disabilities: Secondary | ICD-10-CM | POA: Diagnosis not present

## 2016-12-17 MED ORDER — AMPHETAMINE-DEXTROAMPHETAMINE 30 MG PO TABS: 30.0000 mg | ORAL_TABLET | Freq: Three times a day (TID) | ORAL | 0 refills | Status: DC

## 2016-12-17 MED ORDER — DIAZEPAM 10 MG PO TABS: 10.0000 mg | ORAL_TABLET | Freq: Every evening | ORAL | 2 refills | Status: DC | PRN

## 2016-12-17 NOTE — Telephone Encounter (Signed)
Prior authorization for Adderall received. Called [phone] spoke with Huntley DecSara who states that a decision will be made and faxed to office within 72 hours.

## 2016-12-17 NOTE — Progress Notes (Signed)
Patient ID: Todd Gilbert, male   DOB: 1971/01/04, 46 y.o.   MRN: [id number] Patient ID: Todd Gilbert, male   DOB: [birth year], 46 y.o.   MRN: [id number] Patient ID: Todd Gilbert, male   DOB: 21-Oct-1970, 46 y.o.   MRN: [id number] Patient ID: Todd Gilbert, male   DOB: [birth year], 46 y.o.   MRN: [id number] Patient ID: Todd Gilbert, male   DOB: [birth year], 46 y.o.   MRN: [id number] Patient ID: Todd Gilbert, male   DOB: [birth year], 46 y.o.   MRN: [id number] Patient ID: Todd Gilbert, male   DOB: 1971-06-14, 46 y.o.   MRN: [id number] Patient ID: Todd Gilbert, male   DOB: 1971/06/21, 46 y.o.   MRN: [id number] Patient ID: Todd Gilbert, male   DOB: December 25, 1970, 46 y.o.   MRN: [id number] Patient ID: Todd Gilbert, male   DOB: 1970/11/26, 46 y.o.   MRN: [id number] Patient ID: Todd Gilbert, male   DOB: [birth year], 46 y.o.   MRN: [id number] Patient ID: Todd Gilbert, male   DOB: 11-23-1970, 46 y.o.   MRN: [id number] [facility] [address] Progress Note Todd Gilbert MRN: [id number] DOB: [birth year] Age: 46 y.o.  Date: 12/17/2016 Start Time: 9:05 AM End Time: 9:30 AM  Chief Complaint: Chief Complaint  Patient presents with  . ADHD  . Anxiety  . Follow-up   Subjective: "I'm doing well."  This patient is a 46 year old married white male lives with his wife in [place]. He has 3 children who live with his ex-wife in [place]. He works for a Administrator, arts.  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 He was treated with Ritalin. He was "wild" as a teenager and young adult and dropped out of high school. He was in jail numerous times and used to drink and use drugs. Since he's been on Adderall he's been doing much better. He is much less impulsive and he able to stay focused at work. He is eating and sleeping well and doesn't endorse any symptoms of depression or anxiety. At times he is a bit irritable with his family.  he returns after 3 months. He  continues to do well. 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 He's not sure how long he can keep it up. He is staying focused and sleeping well at night and denies any significant anxiety. His blood pressure still little high but better than last time. He smokes 1 pack a day and I urged him to try to cut down or quit   History of Chief Complaint: Pt first sought help at age 46 and has been in treatment pretty much ever since then.   He has been identified as LD and never graduated from HS.  He kept getting into trouble and getting kicked out of schools.  He has struggled with anxiety and depression and tried on several medications listed below.  He found the best benefit from Adderall.  He comes seeking other meds that don't cost $130 a month.  Since starting Adderall he has not been in trouble with the law.   Anxiety      see above  Review of Systems Neuro: no headaches, ataxia, weakness GI: no N/V/D/cramps/constipation MS: no weakness, muscle cramps, aches. He notes some left knee pain and is going to be seeing his PCP about this  Physical Exam Vitals: BP (!) 150/94 (BP  Location: Right Arm)   Pulse 84   Ht 5\' 10"  (1.778 m)   Wt 217 lb 3.2 oz (98.5 kg)   BMI 31.16 kg/m    Depressive Symptoms: depressed mood, insomnia, difficulty concentrating, suicidal thoughts without plan, anxiety, panic attacks,  (Hypo) Manic Symptoms:   Elevated Mood:  Negative Irritable Mood:  Yes Grandiosity:  history of Distractibility:  Yes Labiality of Mood:  Yes Delusions:  Yes Hallucinations:  Yes Impulsivity:  Yes Sexually Inappropriate Behavior:  No Financial Extravagance:  history of Flight of Ideas:  No  Anxiety Symptoms: Excessive Worry:  Yes Panic Symptoms:  Yes Agoraphobia:  Yes Obsessive Compulsive: Yes  Symptoms: having things in perfect order and exactly right Specific Phobias:  No Social Anxiety:  Yes  Psychotic  Symptoms:  Hallucinations: Yes Visual Delusions:  No Paranoia:  Yes   Ideas of Reference:  No  PTSD Symptoms: Ever had a traumatic exposure:  Yes Had a traumatic exposure in the last month:  No Re-experiencing: Yes Flashbacks Hypervigilance:  No Hyperarousal: No Difficulty Concentrating Avoidance: Yes Decreased Interest/Participation  Traumatic Brain Injury: Yes Blunt Trauma History of Loss of Consciousness:  Yes Seizure History:  No Cardiac History:  No  Past Psychiatric History: Diagnosis: ADHD, Bipolar disorder  Hospitalizations: adult twice  Outpatient Care: since age 43  Substance Abuse Care: adult twice  Self-Mutilation: none  Suicidal Attempts: none  Violent Behaviors: in the past   Allergies: Allergies  Allergen Reactions  . Lithium Other (See Comments)    Bizarre behavior at night, climbing out the window and bringing mail into the bedroom  . Tegretol [Carbamazepine] Other (See Comments)    Way out there VIVID dreams   Medical History: Past Medical History:  Diagnosis Date  . ADHD (attention deficit hyperactivity disorder)   . Anxiety   . Bipolar disorder (HCC)   . Depression   . Obsessive-compulsive disorder   . Psychosis   . Right sciatic nerve pain    Surgical History: Past Surgical History:  Procedure Laterality Date  . HAND TENDON SURGERY  10/13/1996   Family History: family history includes Alcohol abuse in his father; Anxiety disorder in his father; Dementia in his maternal grandmother. Reviewed and nothing new is noted today.  Current Medications:  Current Outpatient Prescriptions  Medication Sig Dispense Refill  . amphetamine-dextroamphetamine (ADDERALL) 30 MG tablet Take 1 tablet by mouth 3 (three) times daily. 90 tablet 0  . diazepam (VALIUM) 10 MG tablet Take 1 tablet (10 mg total) by mouth at bedtime as needed for anxiety. 30 tablet 2  . losartan (COZAAR) 50 MG tablet Take 50 mg by mouth daily.  2  . amphetamine-dextroamphetamine  (ADDERALL) 30 MG tablet Take 1 tablet by mouth 3 (three) times daily. 90 tablet 0  . amphetamine-dextroamphetamine (ADDERALL) 30 MG tablet Take 1 tablet by mouth 3 (three) times daily. 90 tablet 0   No current facility-administered medications for this visit.     Previous Psychotropic Medications: Medication Dose   Paxil   Vistaril   Zoloft   Lamictal   Depakote   Wellbutrin   Klonopin   Adderall   Valium   Ritalin   Remeron   Clonidine/Kapvay   Cymbalta   Neurontin   Effexor    Substance Abuse History in the last 12 months: Substance Age of 1st Use Last Use Amount Specific Type  Nicotine  13  2 weeks  dip    Alcohol  14  yesterday  two 12 oz  beer  Cannabis  14  years      Opiates  none        Cocaine  18  31      Methamphetamines  none        LSD  17  18      Ecstasy  none         Benzodiazepines  30's  last night  5mg   Valium  Caffeine  childhood  today  swallow  soft drink  Inhalants  none        Others:      Sugar  childhood  today    Medical Consequences of Substance Abuse: none Legal Consequences of Substance Abuse: none Family Consequences of Substance Abuse: none Blackouts:  No DT's:  No Withdrawal Symptoms:  No   Social History: Current Place of Residence: [address] [place] [address] Place of Birth: [place] Family Members: wife step son Marital Status:  Married Children: 3  Sons: 2  Daughters: 1 Relationships: wife Education:  dropped out in 11 th grade Educational Problems/Performance: LD and ADHD Religious Beliefs/Practices: none History of Abuse: none Occupational Experiences; none Military History:  Writer History: none Hobbies/Interests: fishing, watching sports, walking outside  Mental Status Examination/Evaluation: Objective:  Appearance: Casual  Eye Contact::  Good  Speech:  Clear and Coherent  Volume:  Normal  Mood:  Good   Affect:  Congruent  Thought Process:  Coherent, Intact and Logical  Orientation:  Full  (Time, Place, and Person)  Thought Content:  no  Suicidal Thoughts:  No  Homicidal Thoughts:  No  Judgement:  Fair  Insight:  Fair  Psychomotor Activity:  Increased and Restlessness  Akathisia:  No  Handed:  Right  AIMS (if indicated):    Assets:  Communication Skills Desire for Improvement   Lab Results: No results found for this or any previous visit (from the past 2016 hour(s)).  Assessment:   AXIS I ADHD, inattentive type, Generalized Anxiety Disorder, Obsessive Compulsive Disorder, Panic Disorder and Tourette Syndrome  AXIS II Deferred  AXIS III Past Medical History:  Diagnosis Date  . ADHD (attention deficit hyperactivity disorder)   . Anxiety   . Bipolar disorder (HCC)   . Depression   . Obsessive-compulsive disorder   . Psychosis   . Right sciatic nerve pain      AXIS IV other psychosocial or environmental problems  AXIS V 61-70 mild symptoms   Therapeutic Plan: Psychotherapy: problem solving  Medications: Adderall 30 mg 3 times a day, Valium 10 mg each bedtime as needed   Routine PRN Medications:  Negative  Consultations: Today to work with PCP on reducing his blood pressure and quitting smoking   Safety Concerns:  none  Other:     Plan/Discussion: I took his vitals.  I reviewed CC, tobacco/med/surg Hx, meds effects/ side effects, problem list, therapies and responses as well as current situation/symptoms discussed options. He will continue his current dose of Adderall for ADHD. He will be prescribed Valium 10 mg each bedtime as needed for insomnia and return in 3 months. He has been strongly recommended to see his primary care physician regarding his blood pressure and also to stop smoking  See orders and pt instructions for more details.  MEDICATIONS this encounter: Meds ordered this encounter  Medications  . diazepam (VALIUM) 10 MG tablet    Sig: Take 1 tablet (10 mg total) by mouth at bedtime as needed for anxiety.    Dispense:  30 tablet  Refill:  2   . amphetamine-dextroamphetamine (ADDERALL) 30 MG tablet    Sig: Take 1 tablet by mouth 3 (three) times daily.    Dispense:  90 tablet    Refill:  0  . amphetamine-dextroamphetamine (ADDERALL) 30 MG tablet    Sig: Take 1 tablet by mouth 3 (three) times daily.    Dispense:  90 tablet    Refill:  0    Do not fill unti 01/17/17  . amphetamine-dextroamphetamine (ADDERALL) 30 MG tablet    Sig: Take 1 tablet by mouth 3 (three) times daily.    Dispense:  90 tablet    Refill:  0    Do not fill before 02/16/17    Medical Decision Making Problem Points:  Established problem, stable/improving (1), Established problem, worsening (2), Review of last therapy session (1) and Review of psycho-social stressors (1) Data Points:  Review or order clinical lab tests (1) Review of medication regiment & side effects (2) Review of new medications or change in dosage (2)  I certify that outpatient services furnished can reasonably be expected to improve the patient's condition.   [name], MD               Patient ID: ROMANI WILBON, male   DOB: 05-19-1971, 46 y.o.   MRN: [id number] Patient ID: BOHDEN DUNG, male   DOB: April 21, 1971, 46 y.o.   MRN: [id number] Patient ID: GEDALYA JIM, male   DOB: 17-Feb-1971, 46 y.o.   MRN: [id number] Patient ID: YEE JOSS, male   DOB: 11-01-1970, 46 y.o.   MRN: [id number] Patient ID: DUSTYN DANSEREAU, male   DOB: 05-Jul-1971, 46 y.o.   MRN: [id number] Patient ID: HEIDI LEMAY, male   DOB: 17-Jun-1971, 46 y.o.   MRN: [id number] Patient ID: TEJ MURDAUGH, male   DOB: 31-Jul-1971, 46 y.o.   MRN: [id number] Patient ID: THERESA DOHRMAN, male   DOB: 10-06-1971, 46 y.o.   MRN: [id number] Patient ID: SEANPAUL PREECE, male   DOB: September 03, 1971, 45 y.o.   MRN: [id number] Patient ID: LUM STILLINGER, male   DOB: [birth year], 46 y.o.   MRN: [id number] Patient ID: LORANCE PICKERAL, male   DOB: 03-Jun-1971, 46 y.o.   MRN: [id number] Patient ID: SEENA FACE, male   DOB:  1971-07-04, 46 y.o.   MRN: [id number] [facility] [address] Progress Note Todd Gilbert MRN: [id number] DOB: 12-21-1970 Age: 46 y.o.  Date: 12/17/2016 Start Time: 9:05 AM End Time: 9:30 AM  Chief Complaint: Chief Complaint  Patient presents with  . ADHD  . Anxiety  . Follow-up   Subjective: "I'm doing well."  This patient is a 46 year old married white male lives with his wife in [place]. He has 3 children who live with his ex-wife in [place]. He works for a Administrator, arts.  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 He was treated with Ritalin. He was "wild" as a teenager and young adult and dropped out of high school. He was in jail numerous times and used to drink and use drugs. Since he's been on Adderall he's been doing much better. He is much less impulsive and he able to stay focused at work. He is eating and sleeping well and doesn't endorse any symptoms of depression or anxiety. At times he is a bit irritable with his family.  he returns after 3 months. He continues to do well. 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 His mood has been good and he is sleeping well with the Valium. He still staying quite focused. He has been put on Cozaar by his PCP but his blood pressure still somewhat high today. He is supposed to be reevaluated for this next week   History of Chief Complaint: Pt first sought help at age 73 and has been in treatment pretty much ever since then.   He has been identified as LD and never graduated from HS.  He kept getting into trouble and getting kicked out of schools.  He has struggled with anxiety and depression and tried on several medications listed below.  He found the best benefit from Adderall.  He comes seeking other meds that don't cost $130 a month.  Since starting Adderall he has not been in  trouble with the law.   Anxiety      see above  Review of Systems Neuro: no headaches, ataxia, weakness GI: no N/V/D/cramps/constipation MS: no weakness, muscle cramps, aches. He notes some joint aches and could use chrondrotin sulfate with glucosamine.  Physical Exam Vitals: BP (!) 150/94 (BP Location: Right Arm)   Pulse 84   Ht 5\' 10"  (1.778 m)   Wt 217 lb 3.2 oz (98.5 kg)   BMI 31.16 kg/m    Depressive Symptoms: depressed mood, insomnia, difficulty concentrating, suicidal thoughts without plan, anxiety, panic attacks,  (Hypo) Manic Symptoms:   Elevated Mood:  Negative Irritable Mood:  Yes Grandiosity:  history of Distractibility:  Yes Labiality of Mood:  Yes Delusions:  Yes Hallucinations:  Yes Impulsivity:  Yes Sexually Inappropriate Behavior:  No Financial Extravagance:  history of Flight of Ideas:  No  Anxiety Symptoms: Excessive Worry:  Yes Panic Symptoms:  Yes Agoraphobia:  Yes Obsessive Compulsive: Yes  Symptoms: having things in perfect order and exactly right Specific Phobias:  No Social Anxiety:  Yes  Psychotic Symptoms:  Hallucinations: Yes Visual Delusions:  No Paranoia:  Yes   Ideas of Reference:  No  PTSD Symptoms: Ever had a traumatic exposure:  Yes Had a traumatic exposure in the last month:  No Re-experiencing: Yes Flashbacks Hypervigilance:  No Hyperarousal: No Difficulty Concentrating Avoidance: Yes Decreased Interest/Participation  Traumatic Brain Injury: Yes Blunt Trauma History of Loss of Consciousness:  Yes Seizure History:  No Cardiac History:  No  Past Psychiatric History: Diagnosis: ADHD, Bipolar disorder  Hospitalizations: adult twice  Outpatient Care: since age 14  Substance Abuse Care: adult twice  Self-Mutilation: none  Suicidal Attempts: none  Violent Behaviors: in the past   Allergies: Allergies  Allergen Reactions  . Lithium Other (See Comments)    Bizarre behavior at night, climbing out the window  and bringing mail into the bedroom  . Tegretol [Carbamazepine] Other (See Comments)    Way out there VIVID dreams   Medical History: Past Medical History:  Diagnosis Date  . ADHD (attention deficit hyperactivity disorder)   . Anxiety   . Bipolar disorder (HCC)   . Depression   . Obsessive-compulsive disorder   . Psychosis   . Right sciatic nerve pain    Surgical History: Past Surgical History:  Procedure Laterality Date  . HAND TENDON SURGERY  10/13/1996   Family History: family history includes Alcohol abuse in his father; Anxiety disorder in his father; Dementia in his maternal grandmother. Reviewed and nothing new is noted today.  Current Medications:  Current Outpatient  Prescriptions  Medication Sig Dispense Refill  . amphetamine-dextroamphetamine (ADDERALL) 30 MG tablet Take 1 tablet by mouth 3 (three) times daily. 90 tablet 0  . diazepam (VALIUM) 10 MG tablet Take 1 tablet (10 mg total) by mouth at bedtime as needed for anxiety. 30 tablet 2  . losartan (COZAAR) 50 MG tablet Take 50 mg by mouth daily.  2  . amphetamine-dextroamphetamine (ADDERALL) 30 MG tablet Take 1 tablet by mouth 3 (three) times daily. 90 tablet 0  . amphetamine-dextroamphetamine (ADDERALL) 30 MG tablet Take 1 tablet by mouth 3 (three) times daily. 90 tablet 0   No current facility-administered medications for this visit.     Previous Psychotropic Medications: Medication Dose   Paxil   Vistaril   Zoloft   Lamictal   Depakote   Wellbutrin   Klonopin   Adderall   Valium   Ritalin   Remeron   Clonidine/Kapvay   Cymbalta   Neurontin   Effexor    Substance Abuse History in the last 12 months: Substance Age of 1st Use Last Use Amount Specific Type  Nicotine  13  2 weeks  dip    Alcohol  14  yesterday  two 12 oz  beer  Cannabis  14  years      Opiates  none        Cocaine  18  31      Methamphetamines  none        LSD  17  18      Ecstasy  none         Benzodiazepines  30's  last night   5mg   Valium  Caffeine  childhood  today  swallow  soft drink  Inhalants  none        Others:      Sugar  childhood  today    Medical Consequences of Substance Abuse: none Legal Consequences of Substance Abuse: none Family Consequences of Substance Abuse: none Blackouts:  No DT's:  No Withdrawal Symptoms:  No   Social History: Current Place of Residence: [address] [place] [id number] Place [place] Birth[place] Family Members: wife step son Marital Status:  Married Children: 3  Sons: 2  Daughters: 1 Relationships: wife Education:  dropped out in 11 th grade Educational Problems/Performance: LD and ADHD Religious Beliefs/Practices: none History of Abuse: none Occupational Experiences; none Military History:  [employer] Legal History: none Hobbies/Interests: fishing, watching sports, walking outside  Mental Status Examination/Evaluation: Objective:  Appearance: Casual  Eye Contact::  Good  Speech:  Clear and Coherent  Volume:  Normal  Mood:  Good   Affect:  Congruent  Thought Process:  Coherent, Intact and Logical  Orientation:  Full (Time, Place, and Person)  Thought Content:  no  Suicidal Thoughts:  No  Homicidal Thoughts:  No  Judgement:  Fair  Insight:  Fair  Psychomotor Activity:  Increased and Restlessness  Akathisia:  No  Handed:  Right  AIMS (if indicated):    Assets:  Communication Skills Desire for Improvement   Lab Results: No results found for this or any previous visit (from the past 2016 hour(s)).  Assessment:   AXIS I ADHD, inattentive type, Generalized Anxiety Disorder, Obsessive Compulsive Disorder, Panic Disorder and Tourette Syndrome  AXIS II Deferred  AXIS III Past Medical History:  Diagnosis Date  . ADHD (attention deficit hyperactivity disorder)   . Anxiety   . Bipolar disorder (HCC)   . Depression   . Obsessive-compulsive disorder   .  Psychosis   . Right sciatic nerve pain      AXIS IV other psychosocial or environmental  problems  AXIS V 61-70 mild symptoms   Therapeutic Plan: Psychotherapy: problem solving  Medications: Adderall 30 mg 3 times a day, Valium 10 mg each bedtime as needed   Routine PRN Medications:  Negative  Consultations: Today to work with PCP on reducing his blood pressure and quitting smoking   Safety Concerns:  none  Other:     Plan/Discussion: I took his vitals.  I reviewed CC, tobacco/med/surg Hx, meds effects/ side effects, problem list, therapies and responses as well as current situation/symptoms discussed options. He will continue his current dose of Adderall for ADHD. He will be prescribed Valium 10 mg each bedtime as needed for insomnia and return in 3 months. He has been strongly recommended to see his primary care physician regarding his blood pressure and also to stop smoking  See orders and pt instructions for more details.  MEDICATIONS this encounter: Meds ordered this encounter  Medications  . diazepam (VALIUM) 10 MG tablet    Sig: Take 1 tablet (10 mg total) by mouth at bedtime as needed for anxiety.    Dispense:  30 tablet    Refill:  2  . amphetamine-dextroamphetamine (ADDERALL) 30 MG tablet    Sig: Take 1 tablet by mouth 3 (three) times daily.    Dispense:  90 tablet    Refill:  0  . amphetamine-dextroamphetamine (ADDERALL) 30 MG tablet    Sig: Take 1 tablet by mouth 3 (three) times daily.    Dispense:  90 tablet    Refill:  0    Do not fill unti 01/17/17  . amphetamine-dextroamphetamine (ADDERALL) 30 MG tablet    Sig: Take 1 tablet by mouth 3 (three) times daily.    Dispense:  90 tablet    Refill:  0    Do not fill before 02/16/17    Medical Decision Making Problem Points:  Established problem, stable/improving (1), Established problem, worsening (2), Review of last therapy session (1) and Review of psycho-social stressors (1) Data Points:  Review or order clinical lab tests (1) Review of medication regiment & side effects (2) Review of new medications or  change in dosage (2)  I certify that outpatient services furnished can reasonably be expected to improve the patient's condition.   [name], MD

## 2016-12-18 ENCOUNTER — Ambulatory Visit (HOSPITAL_COMMUNITY): Payer: Self-pay | Admitting: Psychiatry

## 2016-12-18 MED FILL — AMPHETAMINE SALTS 30 MG TAB: 30 | 30 days supply | Qty: 90 | Fill #0

## 2016-12-18 NOTE — Telephone Encounter (Signed)
Called pt and informed him of this.

## 2016-12-18 NOTE — Telephone Encounter (Signed)
noted 

## 2017-03-16 ENCOUNTER — Encounter (HOSPITAL_COMMUNITY): Payer: Self-pay | Admitting: Psychiatry

## 2017-03-16 ENCOUNTER — Ambulatory Visit (INDEPENDENT_AMBULATORY_CARE_PROVIDER_SITE_OTHER): Payer: 59 | Admitting: Psychiatry

## 2017-03-16 VITALS — BP 130/86 | HR 73 | Ht 72.0 in | Wt 211.0 lb

## 2017-03-16 DIAGNOSIS — F902 Attention-deficit hyperactivity disorder, combined type: Secondary | ICD-10-CM

## 2017-03-16 DIAGNOSIS — F952 Tourette's disorder: Secondary | ICD-10-CM

## 2017-03-16 DIAGNOSIS — Z810 Family history of intellectual disabilities: Secondary | ICD-10-CM

## 2017-03-16 DIAGNOSIS — F429 Obsessive-compulsive disorder, unspecified: Secondary | ICD-10-CM

## 2017-03-16 DIAGNOSIS — F411 Generalized anxiety disorder: Secondary | ICD-10-CM

## 2017-03-16 DIAGNOSIS — Z818 Family history of other mental and behavioral disorders: Secondary | ICD-10-CM | POA: Diagnosis not present

## 2017-03-16 DIAGNOSIS — F410 Panic disorder [episodic paroxysmal anxiety] without agoraphobia: Secondary | ICD-10-CM

## 2017-03-16 DIAGNOSIS — Z811 Family history of alcohol abuse and dependence: Secondary | ICD-10-CM | POA: Diagnosis not present

## 2017-03-16 MED ORDER — AMPHETAMINE-DEXTROAMPHETAMINE 30 MG PO TABS: 30.0000 mg | ORAL_TABLET | Freq: Three times a day (TID) | ORAL | 0 refills | Status: DC

## 2017-03-16 MED ORDER — DIAZEPAM 10 MG PO TABS: 10.0000 mg | ORAL_TABLET | Freq: Every evening | ORAL | 2 refills | Status: DC | PRN

## 2017-03-16 NOTE — Progress Notes (Signed)
Patient ID: Todd Gilbert, male   DOB: 04/22/1971, 46 y.o.   MRN: [id number] Patient ID: Todd Gilbert, male   DOB: [birth year], 46 y.o.   MRN: [id number] Patient ID: Todd Gilbert, male   DOB: 05-Nov-1970, 46 y.o.   MRN: [id number] Patient ID: Todd Gilbert, male   DOB: Apr 11, 1971, 46 y.o.   MRN: [id number] Patient ID: Todd Gilbert, male   DOB: [birth year], 46 y.o.   MRN: [id number] Patient ID: Todd Gilbert, male   DOB: 1971-07-07, 46 y.o.   MRN: [id number] Patient ID: Todd Gilbert, male   DOB: 02-09-1971, 46 y.o.   MRN: [id number] Patient ID: Todd Gilbert, male   DOB: 09-15-1971, 46 y.o.   MRN: [id number] Patient ID: Todd Gilbert, male   DOB: 11-05-1970, 46 y.o.   MRN: [id number] Patient ID: Todd Gilbert, male   DOB: 06-Feb-1971, 46 y.o.   MRN: [id number] Patient ID: Todd Gilbert, male   DOB: [birth year], 46 y.o.   MRN: [id number] Patient ID: Todd Gilbert, male   DOB: 31-Jul-1971, 46 y.o.   MRN: [id number] [facility] [address] Progress Note Todd Gilbert MRN: [id number] DOB: 12-01-1970 Age: 46 y.o.  Date: 03/16/2017 Start Time: 9:05 AM End Time: 9:30 AM  Chief Complaint: Chief Complaint  Patient presents with  . Follow-up  . ADHD   Subjective: "I'm doing well."  This patient is a 46 year old married white male lives with his wife in [place]. He has 3 children who live with his ex-wife in [place]. He works for a Administrator, arts.  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 He was treated with Ritalin. He was "wild" as a teenager and young adult and dropped out of high school. He was in jail numerous times and used to drink and use drugs. Since he's been on Adderall he's been doing much better. He is much less impulsive and he able to stay focused at work. He is eating and sleeping well and doesn't endorse any symptoms of depression or anxiety. At times he is a bit irritable with his family.  he returns after 3 months. He continues to  do well. 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 He is climbing a lot of stairs and hauling heavy boxes. 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 On the positive side he's lost weight and his blood pressure has gone down. He has very little time to smoke and he is down to about 3 cigarettes a day. His focus is good and his anxiety is under good control   History of Chief Complaint: Pt first sought help at age 46 and has been in treatment pretty much ever since then.   He has been identified as LD and never graduated from HS.  He kept getting into trouble and getting kicked out of schools.  He has struggled with anxiety and depression and tried on several medications listed below.  He found the best benefit from Adderall.  He comes seeking other meds that don't cost $130 a month.  Since starting Adderall he has not been in trouble with the law.   Anxiety      see above  Review of Systems Neuro: no headaches, ataxia, weakness GI: no N/V/D/cramps/constipation MS: no weakness, muscle cramps, aches. He notes some left knee pain and is going to be seeing  his PCP about this  Physical Exam Vitals: BP 130/86 (BP Location: Right Arm)   Pulse 73   Ht 6' (1.829 m)   Wt 211 lb (95.7 kg)   SpO2 99%   BMI 28.62 kg/m    Depressive Symptoms: depressed mood, insomnia, difficulty concentrating, suicidal thoughts without plan, anxiety, panic attacks,  (Hypo) Manic Symptoms:   Elevated Mood:  Negative Irritable Mood:  Yes Grandiosity:  history of Distractibility:  Yes Labiality of Mood:  Yes Delusions:  Yes Hallucinations:  Yes Impulsivity:  Yes Sexually Inappropriate Behavior:  No Financial Extravagance:  history of Flight of Ideas:  No  Anxiety Symptoms: Excessive Worry:  Yes Panic Symptoms:  Yes Agoraphobia:  Yes Obsessive Compulsive: Yes  Symptoms: having things in perfect order  and exactly right Specific Phobias:  No Social Anxiety:  Yes  Psychotic Symptoms:  Hallucinations: Yes Visual Delusions:  No Paranoia:  Yes   Ideas of Reference:  No  PTSD Symptoms: Ever had a traumatic exposure:  Yes Had a traumatic exposure in the last month:  No Re-experiencing: Yes Flashbacks Hypervigilance:  No Hyperarousal: No Difficulty Concentrating Avoidance: Yes Decreased Interest/Participation  Traumatic Brain Injury: Yes Blunt Trauma History of Loss of Consciousness:  Yes Seizure History:  No Cardiac History:  No  Past Psychiatric History: Diagnosis: ADHD, Bipolar disorder  Hospitalizations: adult twice  Outpatient Care: since age 89  Substance Abuse Care: adult twice  Self-Mutilation: none  Suicidal Attempts: none  Violent Behaviors: in the past   Allergies: Allergies  Allergen Reactions  . Lithium Other (See Comments)    Bizarre behavior at night, climbing out the window and bringing mail into the bedroom  . Tegretol [Carbamazepine] Other (See Comments)    Way out there VIVID dreams   Medical History: Past Medical History:  Diagnosis Date  . ADHD (attention deficit hyperactivity disorder)   . Anxiety   . Bipolar disorder (HCC)   . Depression   . Obsessive-compulsive disorder   . Psychosis   . Right sciatic nerve pain    Surgical History: Past Surgical History:  Procedure Laterality Date  . HAND TENDON SURGERY  10/13/1996   Family History: family history includes Alcohol abuse in his father; Anxiety disorder in his father; Dementia in his maternal grandmother. Reviewed and nothing new is noted today.  Current Medications:  Current Outpatient Prescriptions  Medication Sig Dispense Refill  . amphetamine-dextroamphetamine (ADDERALL) 30 MG tablet Take 1 tablet by mouth 3 (three) times daily. 90 tablet 0  . amphetamine-dextroamphetamine (ADDERALL) 30 MG tablet Take 1 tablet by mouth 3 (three) times daily. 90 tablet 0  .  amphetamine-dextroamphetamine (ADDERALL) 30 MG tablet Take 1 tablet by mouth 3 (three) times daily. 90 tablet 0  . diazepam (VALIUM) 10 MG tablet Take 1 tablet (10 mg total) by mouth at bedtime as needed for anxiety. 30 tablet 2  . losartan (COZAAR) 50 MG tablet Take 50 mg by mouth daily.  2   No current facility-administered medications for this visit.     Previous Psychotropic Medications: Medication Dose   Paxil   Vistaril   Zoloft   Lamictal   Depakote   Wellbutrin   Klonopin   Adderall   Valium   Ritalin   Remeron   Clonidine/Kapvay   Cymbalta   Neurontin   Effexor    Substance Abuse History in the last 12 months: Substance Age of 1st Use Last Use Amount Specific Type  Nicotine  13  2 weeks  dip  Alcohol  14  yesterday  two 12 oz  beer  Cannabis  14  years      Opiates  none        Cocaine  18  31      Methamphetamines  none        LSD  17  18      Ecstasy  none         Benzodiazepines  30's  last night  5mg   Valium  Caffeine  childhood  today  swallow  soft drink  Inhalants  none        Others:      Sugar  childhood  today    Medical Consequences of Substance Abuse: none Legal Consequences of Substance Abuse: none Family Consequences of Substance Abuse: none Blackouts:  No DT's:  No Withdrawal Symptoms:  No   Social History: Current Place of Residence: [address] [place] [address] Place of Birth: [place] Family Members: wife step son Marital Status:  Married Children: 3  Sons: 2  Daughters: 1 Relationships: wife Education:  dropped out in 11 th grade Educational Problems/Performance: LD and ADHD Religious Beliefs/Practices: none History of Abuse: none Occupational Experiences; none Military History:  Writer History: none Hobbies/Interests: fishing, watching sports, walking outside  Mental Status Examination/Evaluation: Objective:  Appearance: Casual  Eye Contact::  Good  Speech:  Clear and Coherent  Volume:  Normal  Mood:   Good   Affect:  Congruent  Thought Process:  Coherent, Intact and Logical  Orientation:  Full (Time, Place, and Person)  Thought Content:  no  Suicidal Thoughts:  No  Homicidal Thoughts:  No  Judgement:  Fair  Insight:  Fair  Psychomotor Activity:  Increased and Restlessness  Akathisia:  No  Handed:  Right  AIMS (if indicated):    Assets:  Communication Skills Desire for Improvement   Lab Results: No results found for this or any previous visit (from the past 2016 hour(s)).  Assessment:   AXIS I ADHD, inattentive type, Generalized Anxiety Disorder, Obsessive Compulsive Disorder, Panic Disorder and Tourette Syndrome  AXIS II Deferred  AXIS III Past Medical History:  Diagnosis Date  . ADHD (attention deficit hyperactivity disorder)   . Anxiety   . Bipolar disorder (HCC)   . Depression   . Obsessive-compulsive disorder   . Psychosis   . Right sciatic nerve pain      AXIS IV other psychosocial or environmental problems  AXIS V 61-70 mild symptoms   Therapeutic Plan: Psychotherapy: problem solving  Medications: Adderall 30 mg 3 times a day, Valium 10 mg each bedtime as needed   Routine PRN Medications:  Negative  Consultations:    Safety Concerns:  none  Other:     Plan/Discussion: I took his vitals.  I reviewed CC, tobacco/med/surg Hx, meds effects/ side effects, problem list, therapies and responses as well as current situation/symptoms discussed options. See orders and pt instructions for more details.  MEDICATIONS this encounter: Meds ordered this encounter  Medications  . diazepam (VALIUM) 10 MG tablet    Sig: Take 1 tablet (10 mg total) by mouth at bedtime as needed for anxiety.    Dispense:  30 tablet    Refill:  2  . amphetamine-dextroamphetamine (ADDERALL) 30 MG tablet    Sig: Take 1 tablet by mouth 3 (three) times daily.    Dispense:  90 tablet    Refill:  0  . amphetamine-dextroamphetamine (ADDERALL) 30 MG tablet  Sig: Take 1 tablet by mouth 3  (three) times daily.    Dispense:  90 tablet    Refill:  0    Do not fill until 04/14/17  . amphetamine-dextroamphetamine (ADDERALL) 30 MG tablet    Sig: Take 1 tablet by mouth 3 (three) times daily.    Dispense:  90 tablet    Refill:  0    Do not fill before  05/15/17    Medical Decision Making Problem Points:  Established problem, stable/improving (1), Established problem, worsening (2), Review of last therapy session (1) and Review of psycho-social stressors (1) Data Points:  Review or order clinical lab tests (1) Review of medication regiment & side effects (2) Review of new medications or change in dosage (2)  I certify that outpatient services furnished can reasonably be expected to improve the patient's condition.   [name], MD               Patient ID: JC VERON, male   DOB: 02/04/1971, 46 y.o.   MRN: [id number] Patient ID: MAKANI SECKMAN, male   DOB: 1971/07/15, 46 y.o.   MRN: [id number] Patient ID: BRENTLEE DELAGE, male   DOB: [birth year], 46 y.o.   MRN: [id number] Patient ID: AERIC BURNHAM, male   DOB: Jun 30, 1971, 46 y.o.   MRN: [id number] Patient ID: AVIN GIBBONS, male   DOB: [birth year], 46 y.o.   MRN: [id number] Patient ID: MARLEN MOLLICA, male   DOB: [birth year], 47 y.o.   MRN: [id number] Patient ID: SABASTIAN RAIMONDI, male   DOB: September 11, 1971, 46 y.o.   MRN: [id number] Patient ID: AUL MANGIERI, male   DOB: [birth year], 46 y.o.   MRN: [id number] Patient ID: ANTWANE GROSE, male   DOB: 10-Feb-1971, 46 y.o.   MRN: [id number] Patient ID: HANAD LEINO, male   DOB: 1971-06-30, 46 y.o.   MRN: [id number] Patient ID: TIEN SPOONER, male   DOB: [birth year], 46 y.o.   MRN: [id number] Patient ID: AHAN EISENBERGER, male   DOB: [birth year], 46 y.o.   MRN: [id number] [facility] [address] Progress Note Todd Gilbert MRN: [id number] DOB: 10-Oct-1971 Age: 46 y.o.  Date: 03/16/2017 Start Time: 9:05 AM End Time: 9:30 AM  Chief Complaint: Chief Complaint   Patient presents with  . Follow-up  . ADHD   Subjective: "I'm doing well."  This patient is a 46 year old married white male lives with his wife in [place]. He has 3 children who live with his ex-wife in [place]. He works for a Administrator, arts.  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 He was treated with Ritalin. He was "wild" as a teenager and young adult and dropped out of high school. He was in jail numerous times and used to drink and use drugs. Since he's been on Adderall he's been doing much better. He is much less impulsive and he able to stay focused at work. He is eating and sleeping well and doesn't endorse any symptoms of depression or anxiety. At times he is a bit irritable with his family.  he returns after 3 months. He continues to do well. 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 His mood has been good and he is sleeping well with the Valium. He still staying quite focused. He has been put on Cozaar by his PCP but  his blood pressure still somewhat high today. He is supposed to be reevaluated for this next week   History of Chief Complaint: Pt first sought help at age 54 and has been in treatment pretty much ever since then.   He has been identified as LD and never graduated from HS.  He kept getting into trouble and getting kicked out of schools.  He has struggled with anxiety and depression and tried on several medications listed below.  He found the best benefit from Adderall.  He comes seeking other meds that don't cost $130 a month.  Since starting Adderall he has not been in trouble with the law.   Anxiety      see above  Review of Systems Neuro: no headaches, ataxia, weakness GI: no N/V/D/cramps/constipation MS: no weakness, muscle cramps, aches. He notes some joint aches and could use chrondrotin sulfate with  glucosamine.  Physical Exam Vitals: BP 130/86 (BP Location: Right Arm)   Pulse 73   Ht 6' (1.829 m)   Wt 211 lb (95.7 kg)   SpO2 99%   BMI 28.62 kg/m    Depressive Symptoms: depressed mood, insomnia, difficulty concentrating, suicidal thoughts without plan, anxiety, panic attacks,  (Hypo) Manic Symptoms:   Elevated Mood:  Negative Irritable Mood:  Yes Grandiosity:  history of Distractibility:  Yes Labiality of Mood:  Yes Delusions:  Yes Hallucinations:  Yes Impulsivity:  Yes Sexually Inappropriate Behavior:  No Financial Extravagance:  history of Flight of Ideas:  No  Anxiety Symptoms: Excessive Worry:  Yes Panic Symptoms:  Yes Agoraphobia:  Yes Obsessive Compulsive: Yes  Symptoms: having things in perfect order and exactly right Specific Phobias:  No Social Anxiety:  Yes  Psychotic Symptoms:  Hallucinations: Yes Visual Delusions:  No Paranoia:  Yes   Ideas of Reference:  No  PTSD Symptoms: Ever had a traumatic exposure:  Yes Had a traumatic exposure in the last month:  No Re-experiencing: Yes Flashbacks Hypervigilance:  No Hyperarousal: No Difficulty Concentrating Avoidance: Yes Decreased Interest/Participation  Traumatic Brain Injury: Yes Blunt Trauma History of Loss of Consciousness:  Yes Seizure History:  No Cardiac History:  No  Past Psychiatric History: Diagnosis: ADHD, Bipolar disorder  Hospitalizations: adult twice  Outpatient Care: since age 45  Substance Abuse Care: adult twice  Self-Mutilation: none  Suicidal Attempts: none  Violent Behaviors: in the past   Allergies: Allergies  Allergen Reactions  . Lithium Other (See Comments)    Bizarre behavior at night, climbing out the window and bringing mail into the bedroom  . Tegretol [Carbamazepine] Other (See Comments)    Way out there VIVID dreams   Medical History: Past Medical History:  Diagnosis Date  . ADHD (attention deficit hyperactivity disorder)   . Anxiety   . Bipolar  disorder (HCC)   . Depression   . Obsessive-compulsive disorder   . Psychosis   . Right sciatic nerve pain    Surgical History: Past Surgical History:  Procedure Laterality Date  . HAND TENDON SURGERY  10/13/1996   Family History: family history includes Alcohol abuse in his father; Anxiety disorder in his father; Dementia in his maternal grandmother. Reviewed and nothing new is noted today.  Current Medications:  Current Outpatient Prescriptions  Medication Sig Dispense Refill  . amphetamine-dextroamphetamine (ADDERALL) 30 MG tablet Take 1 tablet by mouth 3 (three) times daily. 90 tablet 0  . amphetamine-dextroamphetamine (ADDERALL) 30 MG tablet Take 1 tablet by mouth 3 (three) times daily. 90 tablet 0  . amphetamine-dextroamphetamine (ADDERALL)  30 MG tablet Take 1 tablet by mouth 3 (three) times daily. 90 tablet 0  . diazepam (VALIUM) 10 MG tablet Take 1 tablet (10 mg total) by mouth at bedtime as needed for anxiety. 30 tablet 2  . losartan (COZAAR) 50 MG tablet Take 50 mg by mouth daily.  2   No current facility-administered medications for this visit.     Previous Psychotropic Medications: Medication Dose   Paxil   Vistaril   Zoloft   Lamictal   Depakote   Wellbutrin   Klonopin   Adderall   Valium   Ritalin   Remeron   Clonidine/Kapvay   Cymbalta   Neurontin   Effexor    Substance Abuse History in the last 12 months: Substance Age of 1st Use Last Use Amount Specific Type  Nicotine  13  2 weeks  dip    Alcohol  14  yesterday  two 12 oz  beer  Cannabis  14  years      Opiates  none        Cocaine  18  31      Methamphetamines  none        LSD  17  18      Ecstasy  none         Benzodiazepines  30's  last night  5mg   Valium  Caffeine  childhood  today  swallow  soft drink  Inhalants  none        Others:      Sugar  childhood  today    Medical Consequences of Substance Abuse: none Legal Consequences of Substance Abuse: none Family Consequences of Substance  Abuse: none Blackouts:  No DT's:  No Withdrawal Symptoms:  No   Social History: Current Place of Residence: [address] [place] [address] Place of Birth: [place] Family Members: wife step son Marital Status:  Married Children: 3  Sons: 2  Daughters: 1 Relationships: wife Education:  dropped out in 11 th grade Educational Problems/Performance: LD and ADHD Religious Beliefs/Practices: none History of Abuse: none Occupational Experiences; none Military History:  Writer History: none Hobbies/Interests: fishing, watching sports, walking outside  Mental Status Examination/Evaluation: Objective:  Appearance: Casual  Eye Contact::  Good  Speech:  Clear and Coherent  Volume:  Normal  Mood:  Good   Affect:  Congruent  Thought Process:  Coherent, Intact and Logical  Orientation:  Full (Time, Place, and Person)  Thought Content:  no  Suicidal Thoughts:  No  Homicidal Thoughts:  No  Judgement:  Fair  Insight:  Fair  Psychomotor Activity:  Increased and Restlessness  Akathisia:  No  Handed:  Right  AIMS (if indicated):    Assets:  Communication Skills Desire for Improvement   Lab Results: No results found for this or any previous visit (from the past 2016 hour(s)).  Assessment:   AXIS I ADHD, inattentive type, Generalized Anxiety Disorder, Obsessive Compulsive Disorder, Panic Disorder and Tourette Syndrome  AXIS II Deferred  AXIS III Past Medical History:  Diagnosis Date  . ADHD (attention deficit hyperactivity disorder)   . Anxiety   . Bipolar disorder (HCC)   . Depression   . Obsessive-compulsive disorder   . Psychosis   . Right sciatic nerve pain      AXIS IV other psychosocial or environmental problems  AXIS V 61-70 mild symptoms   Therapeutic Plan: Psychotherapy: problem solving  Medications: Adderall 30 mg 3 times a day, Valium 10 mg each bedtime  as needed   Routine PRN Medications:  Negative  Consultations: Today to work with PCP on reducing  his blood pressure and quitting smoking   Safety Concerns:  none  Other:     Plan/Discussion: I took his vitals.  I reviewed CC, tobacco/med/surg Hx, meds effects/ side effects, problem list, therapies and responses as well as current situation/symptoms discussed options. He will continue his current dose of Adderall for ADHD. He will be prescribed Valium 10 mg each bedtime as needed for insomnia and return in 3 months. He has been strongly recommended to see his primary care physician regarding his blood pressure and also to stop smoking  See orders and pt instructions for more details.  MEDICATIONS this encounter: Meds ordered this encounter  Medications  . diazepam (VALIUM) 10 MG tablet    Sig: Take 1 tablet (10 mg total) by mouth at bedtime as needed for anxiety.    Dispense:  30 tablet    Refill:  2  . amphetamine-dextroamphetamine (ADDERALL) 30 MG tablet    Sig: Take 1 tablet by mouth 3 (three) times daily.    Dispense:  90 tablet    Refill:  0  . amphetamine-dextroamphetamine (ADDERALL) 30 MG tablet    Sig: Take 1 tablet by mouth 3 (three) times daily.    Dispense:  90 tablet    Refill:  0    Do not fill until 04/14/17  . amphetamine-dextroamphetamine (ADDERALL) 30 MG tablet    Sig: Take 1 tablet by mouth 3 (three) times daily.    Dispense:  90 tablet    Refill:  0    Do not fill before  05/15/17    Medical Decision Making Problem Points:  Established problem, stable/improving (1), Established problem, worsening (2), Review of last therapy session (1) and Review of psycho-social stressors (1) Data Points:  Review or order clinical lab tests (1) Review of medication regiment & side effects (2) Review of new medications or change in dosage (2)  I certify that outpatient services furnished can reasonably be expected to improve the patient's condition.   [name], MD

## 2017-05-14 ENCOUNTER — Telehealth (HOSPITAL_COMMUNITY): Payer: Self-pay | Admitting: *Deleted

## 2017-05-14 NOTE — Telephone Encounter (Signed)
He can fill it today

## 2017-05-14 NOTE — Telephone Encounter (Signed)
Per previous phone call, Dr. Tenny Crawoss agreed for staff to [facility] and give them the approval for pt medication to be filled today instead of tomorrow. Called pt pharmacy and spoke with Gena. Approval was given to Gena and stated she will get pt medication ready for him.

## 2017-05-14 NOTE — Telephone Encounter (Signed)
Pt called stating that due to July having 31 days, he is short on his Adderall. Per pt, he called his pharmacy and they stated pt script has do not fill until 05-15-17. Per pt the pharmacy informed him to call office and see if Dr. Tenny Crawoss could call them to approve an early fill instead of making pt wait for tomorrow 05-15-2017 to get it filled. Pt number is [phone].

## 2017-05-14 NOTE — Telephone Encounter (Signed)
Called pt pharmacy and spoke with Gena and informed her with providers approval and pt is aware.

## 2017-06-16 ENCOUNTER — Ambulatory Visit (INDEPENDENT_AMBULATORY_CARE_PROVIDER_SITE_OTHER): Payer: 59 | Admitting: Psychiatry

## 2017-06-16 ENCOUNTER — Encounter (HOSPITAL_COMMUNITY): Payer: Self-pay | Admitting: Psychiatry

## 2017-06-16 VITALS — BP 146/85 | HR 80 | Ht 72.0 in | Wt 213.0 lb

## 2017-06-16 DIAGNOSIS — Z811 Family history of alcohol abuse and dependence: Secondary | ICD-10-CM | POA: Diagnosis not present

## 2017-06-16 DIAGNOSIS — Z810 Family history of intellectual disabilities: Secondary | ICD-10-CM

## 2017-06-16 DIAGNOSIS — F952 Tourette's disorder: Secondary | ICD-10-CM

## 2017-06-16 DIAGNOSIS — F902 Attention-deficit hyperactivity disorder, combined type: Secondary | ICD-10-CM | POA: Diagnosis not present

## 2017-06-16 DIAGNOSIS — Z818 Family history of other mental and behavioral disorders: Secondary | ICD-10-CM | POA: Diagnosis not present

## 2017-06-16 DIAGNOSIS — F419 Anxiety disorder, unspecified: Secondary | ICD-10-CM | POA: Diagnosis not present

## 2017-06-16 DIAGNOSIS — F913 Oppositional defiant disorder: Secondary | ICD-10-CM | POA: Diagnosis not present

## 2017-06-16 MED ORDER — AMPHETAMINE-DEXTROAMPHETAMINE 30 MG PO TABS: 30.0000 mg | ORAL_TABLET | Freq: Three times a day (TID) | ORAL | 0 refills | Status: DC

## 2017-06-16 MED ORDER — DIAZEPAM 10 MG PO TABS: 10.0000 mg | ORAL_TABLET | Freq: Every evening | ORAL | 2 refills | Status: DC | PRN

## 2017-06-16 NOTE — Progress Notes (Signed)
Patient ID: Todd Gilbert, male   DOB: 1971-02-02, 46 y.o.   MRN: [id number] Patient ID: Todd Gilbert, male   DOB: [birth year], 46 y.o.   MRN: [id number] Patient ID: Todd Gilbert, male   DOB: [birth year], 46 y.o.   MRN: [id number] Patient ID: Todd Gilbert, male   DOB: 12-Aug-1971, 46 y.o.   MRN: [id number] Patient ID: Todd Gilbert, male   DOB: 10-22-1970, 46 y.o.   MRN: [id number] Patient ID: Todd Gilbert, male   DOB: 10-Nov-1970, 46 y.o.   MRN: [id number] Patient ID: Todd Gilbert, male   DOB: 1971/03/15, 46 y.o.   MRN: [id number] Patient ID: Todd Gilbert, male   DOB: 02/09/1971, 46 y.o.   MRN: [id number] Patient ID: Todd Gilbert, male   DOB: June 15, 1971, 46 y.o.   MRN: [id number] Patient ID: Todd Gilbert, male   DOB: 1971/05/09, 46 y.o.   MRN: [id number] Patient ID: Todd Gilbert, male   DOB: 04-21-1971, 46 y.o.   MRN: [id number] Patient ID: Todd Gilbert, male   DOB: [birth year], 46 y.o.   MRN: [id number] [facility] [address] Progress Note Todd Gilbert MRN: [id number] DOB: [birth year] Age: 46 y.o.  Date: 06/16/2017 Start Time: 9:05 AM End Time: 9:30 AM  Chief Complaint: No chief complaint on file.  Subjective: "I'm doing well."  This patient is a 46 year old married white male lives with his wife in [place]. He has 3 children who live with his ex-wife in [place]. He works for a Administrator, arts.  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 He was treated with Ritalin. He was "wild" as a teenager and young adult and dropped out of high school. He was in jail numerous times and used to drink and use drugs. Since he's been on Adderall he's been doing much better. He is much less impulsive and he able to stay focused at work. He is eating and sleeping well and doesn't endorse any symptoms of depression or anxiety. At times he is a bit irritable with his family.  he returns after 3 months. He continues to do well. 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 His mood is good and he feels the Adderall is helping his focus quite well. He sleeps well with the addition of Valium.   History of Chief Complaint: Pt first sought help at age 27 and has been in treatment pretty much ever since then.   He has been identified as LD and never graduated from HS.  He kept getting into trouble and getting kicked out of schools.  He has struggled with anxiety and depression and tried on several medications listed below.  He found the best benefit from Adderall.  He comes seeking other meds that don't cost $130 a month.  Since starting Adderall he has not been in trouble with the law.   Anxiety      see above  Review of Systems Neuro: no headaches, ataxia, weakness GI: no N/V/D/cramps/constipation MS: no weakness, muscle cramps, aches. He notes some left knee pain and is going to be seeing his PCP about this  Physical Exam Vitals: BP (!) 146/85 (BP Location: Right Arm, Patient Position: Sitting, Cuff Size: Large)   Pulse 80   Ht 6' (1.829 m)   Wt 213 lb (96.6 kg)  BMI 28.89 kg/m    Depressive Symptoms: depressed mood, insomnia, difficulty concentrating, suicidal thoughts without plan, anxiety, panic attacks,  (Hypo) Manic Symptoms:   Elevated Mood:  Negative Irritable Mood:  Yes Grandiosity:  history of Distractibility:  Yes Labiality of Mood: no Delusions: no Hallucinations: no Impulsivity: no Sexually Inappropriate Behavior:  No Financial Extravagance:  history of Flight of Ideas:  No  Anxiety Symptoms: Excessive Worry:  Yes Panic Symptoms:  Yes Agoraphobia:  Yes Obsessive Compulsive: Yes  Symptoms: having things in perfect order and exactly right Specific Phobias:  No Social Anxiety:  Yes  Psychotic Symptoms:  Hallucinations: Yes Visual Delusions:  No Paranoia: no Ideas of Reference:   No  PTSD Symptoms: Ever had a traumatic exposure:  Yes Had a traumatic exposure in the last month:  No Re-experiencing: Yes Flashbacks Hypervigilance:  No Hyperarousal: No Difficulty Concentrating Avoidance: Yes Decreased Interest/Participation  Traumatic Brain Injury: Yes Blunt Trauma History of Loss of Consciousness:  Yes Seizure History:  No Cardiac History:  No  Past Psychiatric History: Diagnosis: ADHD, Bipolar disorder  Hospitalizations: adult twice  Outpatient Care: since age 8  Substance Abuse Care: adult twice  Self-Mutilation: none  Suicidal Attempts: none  Violent Behaviors: in the past   Allergies: Allergies  Allergen Reactions  . Lithium Other (See Comments)    Bizarre behavior at night, climbing out the window and bringing mail into the bedroom  . Tegretol [Carbamazepine] Other (See Comments)    Way out there VIVID dreams   Medical History: Past Medical History:  Diagnosis Date  . ADHD (attention deficit hyperactivity disorder)   . Anxiety   . Bipolar disorder (HCC)   . Depression   . Obsessive-compulsive disorder   . Psychosis   . Right sciatic nerve pain    Surgical History: Past Surgical History:  Procedure Laterality Date  . HAND TENDON SURGERY  10/13/1996   Family History: family history includes Alcohol abuse in his father; Anxiety disorder in his father; Dementia in his maternal grandmother. Reviewed and nothing new is noted today.  Current Medications:  Current Outpatient Prescriptions  Medication Sig Dispense Refill  . amphetamine-dextroamphetamine (ADDERALL) 30 MG tablet Take 1 tablet by mouth 3 (three) times daily. 90 tablet 0  . diazepam (VALIUM) 10 MG tablet Take 1 tablet (10 mg total) by mouth at bedtime as needed for anxiety. 30 tablet 2  . losartan (COZAAR) 50 MG tablet Take 50 mg by mouth daily.  2  . amphetamine-dextroamphetamine (ADDERALL) 30 MG tablet Take 1 tablet by mouth 3 (three) times daily. 90 tablet 0  .  amphetamine-dextroamphetamine (ADDERALL) 30 MG tablet Take 1 tablet by mouth 3 (three) times daily. 90 tablet 0   No current facility-administered medications for this visit.     Previous Psychotropic Medications: Medication Dose   Paxil   Vistaril   Zoloft   Lamictal   Depakote   Wellbutrin   Klonopin   Adderall   Valium   Ritalin   Remeron   Clonidine/Kapvay   Cymbalta   Neurontin   Effexor    Substance Abuse History in the last 12 months: Substance Age of 1st Use Last Use Amount Specific Type  Nicotine  13  2 weeks  dip    Alcohol  14  yesterday  two 12 oz  beer  Cannabis  14  years      Opiates  none        Cocaine  18  31      Methamphetamines  none        LSD  17  18      Ecstasy  none         Benzodiazepines  30's  last night  5mg   Valium  Caffeine  childhood  today  swallow  soft drink  Inhalants  none        Others:      Sugar  childhood  today    Medical Consequences of Substance Abuse: none Legal Consequences of Substance Abuse: none Family Consequences of Substance Abuse: none Blackouts:  No DT's:  No Withdrawal Symptoms:  No   Social History: Current Place of Residence: [address] [place] [id number] Place of Birth: [place] Family Members: wife step son Marital Status:  Married Children: 3  Sons: 2  Daughters: 1 Relationships: wife Education:  dropped out in 11 th grade Educational Problems/Performance: LD and ADHD Religious Beliefs/Practices: none History of Abuse: none Occupational Experiences; none Military History:  [employer] Legal History: none Hobbies/Interests: fishing, watching sports, walking outside  Mental Status Examination/Evaluation: Objective:  Appearance: Casual  Eye Contact::  Good  Speech:  Clear and Coherent  Volume:  Normal  Mood:  Good   Affect:  Congruent  Thought Process:  Coherent, Intact and Logical  Orientation:  Full (Time, Place, and Person)  Thought Content:  no  Suicidal Thoughts:  No  Homicidal  Thoughts:  No  Judgement:  Fair  Insight:  Fair  Psychomotor Activity:  Increased and Restlessness  Akathisia:  No  Handed:  Right  AIMS (if indicated):    Assets:  Communication Skills Desire for Improvement   Lab Results: No results found for this or any previous visit (from the past 2016 hour(s)).  Assessment:   AXIS I ADHD, inattentive type, Generalized Anxiety Disorder, Obsessive Compulsive Disorder, Panic Disorder and Tourette Syndrome  AXIS II Deferred  AXIS III Past Medical History:  Diagnosis Date  . ADHD (attention deficit hyperactivity disorder)   . Anxiety   . Bipolar disorder (HCC)   . Depression   . Obsessive-compulsive disorder   . Psychosis   . Right sciatic nerve pain      AXIS IV other psychosocial or environmental problems  AXIS V 61-70 mild symptoms   Therapeutic Plan: Psychotherapy: problem solving  Medications: Adderall 30 mg 3 times a day, Valium 10 mg each bedtime as needed   Routine PRN Medications:  Negative  Consultations:    Safety Concerns:  none  Other:     Plan/Discussion: I took his vitals.  I reviewed CC, tobacco/med/surg Hx, meds effects/ side effects, problem list, therapies and responses as well as current situation/symptoms discussed options. See orders and pt instructions for more details.  MEDICATIONS this encounter: Meds ordered this encounter  Medications  . amphetamine-dextroamphetamine (ADDERALL) 30 MG tablet    Sig: Take 1 tablet by mouth 3 (three) times daily.    Dispense:  90 tablet    Refill:  0  . amphetamine-dextroamphetamine (ADDERALL) 30 MG tablet    Sig: Take 1 tablet by mouth 3 (three) times daily.    Dispense:  90 tablet    Refill:  0    Do not fill until 07/16/17  . amphetamine-dextroamphetamine (ADDERALL) 30 MG tablet    Sig: Take 1 tablet by mouth 3 (three) times daily.    Dispense:  90 tablet    Refill:  0    Do not fill before 08/16/17  . diazepam (VALIUM) 10 MG tablet  Sig: Take 1 tablet (10 mg  total) by mouth at bedtime as needed for anxiety.    Dispense:  30 tablet    Refill:  2    Medical Decision Making Problem Points:  Established problem, stable/improving (1), Established problem, worsening (2), Review of last therapy session (1) and Review of psycho-social stressors (1) Data Points:  Review or order clinical lab tests (1) Review of medication regiment & side effects (2) Review of new medications or change in dosage (2)  I certify that outpatient services furnished can reasonably be expected to improve the patient's condition.   [name], MD               Patient ID: Todd Gilbert, male   DOB: [birth year], 46 y.o.   MRN: [id number] Patient ID: RONDALE NIES, male   DOB: 1971-02-18, 46 y.o.   MRN: [id number] Patient ID: ILLYA GIENGER, male   DOB: 07-01-1971, 46 y.o.   MRN: [id number] Patient ID: KEYLOR RANDS, male   DOB: 07-19-1971, 46 y.o.   MRN: [id number] Patient ID: DRAYCE TAWIL, male   DOB: Aug 29, 1971, 46 y.o.   MRN: [id number] Patient ID: JUANMIGUEL DEFELICE, male   DOB: October 24, 1970, 46 y.o.   MRN: [id number] Patient ID: TREVEN HOLTMAN, male   DOB: 06-23-1971, 46 y.o.   MRN: [id number] Patient ID: VIHAN SANTAGATA, male   DOB: [birth year], 46 y.o.   MRN: [id number] Patient ID: JERMANI EBERLEIN, male   DOB: 20-Nov-1970, 46 y.o.   MRN: [id number] Patient ID: SHAMAR ENGELMANN, male   DOB: 11-May-1971, 46 y.o.   MRN: [id number] Patient ID: CHASTEN BLAZE, male   DOB: August 13, 1971, 46 y.o.   MRN: [id number] Patient ID: XAIVER ROSKELLEY, male   DOB: [birth year], 46 y.o.   MRN: [id number] [facility] [address] Progress Note JAMORION GOMILLION MRN: [id number] DOB: [birth year] Age: 46 y.o.  Date: 06/16/2017 Start Time: 9:05 AM End Time: 9:30 AM  Chief Complaint: No chief complaint on file.  Subjective: "I'm doing well."  This patient is a 46 year old married white male lives with his wife in [place]. He has 3 children who live with his ex-wife in [place]. He  works for a Administrator, arts.  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 He was treated with Ritalin. He was "wild" as a teenager and young adult and dropped out of high school. He was in jail numerous times and used to drink and use drugs. Since he's been on Adderall he's been doing much better. He is much less impulsive and he able to stay focused at work. He is eating and sleeping well and doesn't endorse any symptoms of depression or anxiety. At times he is a bit irritable with his family.  he returns after 3 months. He continues to do well. 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 His mood has been good and he is sleeping well with the Valium. He still staying quite focused. He has been put on Cozaar by his PCP but his blood pressure still somewhat high today. He is supposed to be reevaluated for this next week   History of Chief Complaint: Pt first sought help at age 57 and has been in treatment pretty much ever since then.   He has been identified as LD and never graduated from HS.  He kept getting into trouble and getting kicked out of schools.  He has struggled with anxiety and depression and tried on several medications listed below.  He found the best benefit from Adderall.  He comes seeking other meds that don't cost $130 a month.  Since starting Adderall he has not been in trouble with the law.   Anxiety      see above  Review of Systems Neuro: no headaches, ataxia, weakness GI: no N/V/D/cramps/constipation MS: no weakness, muscle cramps, aches. He notes some joint aches and could use chrondrotin sulfate with glucosamine.  Physical Exam Vitals: BP (!) 146/85 (BP Location: Right Arm, Patient Position: Sitting, Cuff Size: Large)   Pulse 80   Ht 6' (1.829 m)   Wt 213 lb (96.6 kg)   BMI 28.89 kg/m    Depressive Symptoms:  depressed mood, insomnia, difficulty concentrating, suicidal thoughts without plan, anxiety, panic attacks,  (Hypo) Manic Symptoms:   Elevated Mood:  Negative Irritable Mood:  Yes Grandiosity:  history of Distractibility:  Yes Labiality of Mood:  Yes Delusions:  Yes Hallucinations:  Yes Impulsivity:  Yes Sexually Inappropriate Behavior:  No Financial Extravagance:  history of Flight of Ideas:  No  Anxiety Symptoms: Excessive Worry:  Yes Panic Symptoms:  Yes Agoraphobia:  Yes Obsessive Compulsive: Yes  Symptoms: having things in perfect order and exactly right Specific Phobias:  No Social Anxiety:  Yes  Psychotic Symptoms:  Hallucinations: Yes Visual Delusions:  No Paranoia:  Yes   Ideas of Reference:  No  PTSD Symptoms: Ever had a traumatic exposure:  Yes Had a traumatic exposure in the last month:  No Re-experiencing: Yes Flashbacks Hypervigilance:  No Hyperarousal: No Difficulty Concentrating Avoidance: Yes Decreased Interest/Participation  Traumatic Brain Injury: Yes Blunt Trauma History of Loss of Consciousness:  Yes Seizure History:  No Cardiac History:  No  Past Psychiatric History: Diagnosis: ADHD, Bipolar disorder  Hospitalizations: adult twice  Outpatient Care: since age 42  Substance Abuse Care: adult twice  Self-Mutilation: none  Suicidal Attempts: none  Violent Behaviors: in the past   Allergies: Allergies  Allergen Reactions  . Lithium Other (See Comments)    Bizarre behavior at night, climbing out the window and bringing mail into the bedroom  . Tegretol [Carbamazepine] Other (See Comments)    Way out there VIVID dreams   Medical History: Past Medical History:  Diagnosis Date  . ADHD (attention deficit hyperactivity disorder)   . Anxiety   . Bipolar disorder (HCC)   . Depression   . Obsessive-compulsive disorder   . Psychosis   . Right sciatic nerve pain    Surgical History: Past Surgical History:  Procedure Laterality Date   . HAND TENDON SURGERY  10/13/1996   Family History: family history includes Alcohol abuse in his father; Anxiety disorder in his father; Dementia in his maternal grandmother. Reviewed and nothing new is noted today.  Current Medications:  Current Outpatient Prescriptions  Medication Sig Dispense Refill  . amphetamine-dextroamphetamine (ADDERALL) 30 MG tablet Take 1 tablet by mouth 3 (three) times daily. 90 tablet 0  . diazepam (VALIUM) 10 MG tablet Take 1 tablet (10 mg total) by mouth at bedtime as needed for anxiety. 30 tablet 2  . losartan (COZAAR) 50 MG tablet Take 50 mg by mouth daily.  2  . amphetamine-dextroamphetamine (ADDERALL) 30 MG tablet Take 1 tablet by mouth 3 (three) times daily. 90 tablet 0  . amphetamine-dextroamphetamine (ADDERALL) 30 MG tablet Take 1 tablet by mouth 3 (three) times daily.  90 tablet 0   No current facility-administered medications for this visit.     Previous Psychotropic Medications: Medication Dose   Paxil   Vistaril   Zoloft   Lamictal   Depakote   Wellbutrin   Klonopin   Adderall   Valium   Ritalin   Remeron   Clonidine/Kapvay   Cymbalta   Neurontin   Effexor    Substance Abuse History in the last 12 months: Substance Age of 1st Use Last Use Amount Specific Type  Nicotine  13  2 weeks  dip    Alcohol  14  yesterday  two 12 oz  beer  Cannabis  14  years      Opiates  none        Cocaine  18  31      Methamphetamines  none        LSD  17  18      Ecstasy  none         Benzodiazepines  30's  last night  5mg   Valium  Caffeine  childhood  today  swallow  soft drink  Inhalants  none        Others:      Sugar  childhood  today    Medical Consequences of Substance Abuse: none Legal Consequences of Substance Abuse: none Family Consequences of Substance Abuse: none Blackouts:  No DT's:  No Withdrawal Symptoms:  No   Social History: Current Place of Residence: [address] [place] [address] Place of Birth: [place] Family  Members: wife step son Marital Status:  Married Children: 3  Sons: 2  Daughters: 1 Relationships: wife Education:  dropped out in 11 th grade Educational Problems/Performance: LD and ADHD Religious Beliefs/Practices: none History of Abuse: none Occupational Experiences; none Military History:  Writer History: none Hobbies/Interests: fishing, watching sports, walking outside  Mental Status Examination/Evaluation: Objective:  Appearance: Casual  Eye Contact::  Good  Speech:  Clear and Coherent  Volume:  Normal  Mood:  Good   Affect:  Congruent  Thought Process:  Coherent, Intact and Logical  Orientation:  Full (Time, Place, and Person)  Thought Content:  no  Suicidal Thoughts:  No  Homicidal Thoughts:  No  Judgement:  Fair  Insight:  Fair  Psychomotor Activity:  Increased and Restlessness  Akathisia:  No  Handed:  Right  AIMS (if indicated):    Assets:  Communication Skills Desire for Improvement   Lab Results: No results found for this or any previous visit (from the past 2016 hour(s)).  Assessment:   AXIS I ADHD, inattentive type, Generalized Anxiety Disorder, Obsessive Compulsive Disorder, Panic Disorder and Tourette Syndrome  AXIS II Deferred  AXIS III Past Medical History:  Diagnosis Date  . ADHD (attention deficit hyperactivity disorder)   . Anxiety   . Bipolar disorder (HCC)   . Depression   . Obsessive-compulsive disorder   . Psychosis   . Right sciatic nerve pain      AXIS IV other psychosocial or environmental problems  AXIS V 61-70 mild symptoms   Therapeutic Plan: Psychotherapy: problem solving  Medications: Adderall 30 mg 3 times a day, Valium 10 mg each bedtime as needed   Routine PRN Medications:  Negative  Consultations: Today to work with PCP on reducing his blood pressure and quitting smoking   Safety Concerns:  none  Other:     Plan/Discussion: I took his vitals.  I reviewed CC, tobacco/med/surg Hx, meds effects/ side  effects, problem list, therapies and responses as well as current situation/symptoms discussed options. He will continue his current dose of Adderall for ADHD. He will be prescribed Valium 10 mg each bedtime as needed for insomnia and return in 3 months. He has been strongly recommended to see his primary care physician regarding his blood pressure and also to stop smoking  See orders and pt instructions for more details.  MEDICATIONS this encounter: Meds ordered this encounter  Medications  . amphetamine-dextroamphetamine (ADDERALL) 30 MG tablet    Sig: Take 1 tablet by mouth 3 (three) times daily.    Dispense:  90 tablet    Refill:  0  . amphetamine-dextroamphetamine (ADDERALL) 30 MG tablet    Sig: Take 1 tablet by mouth 3 (three) times daily.    Dispense:  90 tablet    Refill:  0    Do not fill until 07/16/17  . amphetamine-dextroamphetamine (ADDERALL) 30 MG tablet    Sig: Take 1 tablet by mouth 3 (three) times daily.    Dispense:  90 tablet    Refill:  0    Do not fill before 08/16/17  . diazepam (VALIUM) 10 MG tablet    Sig: Take 1 tablet (10 mg total) by mouth at bedtime as needed for anxiety.    Dispense:  30 tablet    Refill:  2    Medical Decision Making Problem Points:  Established problem, stable/improving (1), Established problem, worsening (2), Review of last therapy session (1) and Review of psycho-social stressors (1) Data Points:  Review or order clinical lab tests (1) Review of medication regiment & side effects (2) Review of new medications or change in dosage (2)  I certify that outpatient services furnished can reasonably be expected to improve the patient's condition.   [name], MD

## 2017-09-15 ENCOUNTER — Encounter (HOSPITAL_COMMUNITY): Payer: Self-pay | Admitting: Psychiatry

## 2017-09-15 ENCOUNTER — Ambulatory Visit (HOSPITAL_COMMUNITY): Payer: 59 | Admitting: Psychiatry

## 2017-09-15 DIAGNOSIS — Z811 Family history of alcohol abuse and dependence: Secondary | ICD-10-CM | POA: Diagnosis not present

## 2017-09-15 DIAGNOSIS — F419 Anxiety disorder, unspecified: Secondary | ICD-10-CM | POA: Diagnosis not present

## 2017-09-15 DIAGNOSIS — F902 Attention-deficit hyperactivity disorder, combined type: Secondary | ICD-10-CM | POA: Diagnosis not present

## 2017-09-15 DIAGNOSIS — Z810 Family history of intellectual disabilities: Secondary | ICD-10-CM | POA: Diagnosis not present

## 2017-09-15 DIAGNOSIS — F17210 Nicotine dependence, cigarettes, uncomplicated: Secondary | ICD-10-CM

## 2017-09-15 DIAGNOSIS — Z818 Family history of other mental and behavioral disorders: Secondary | ICD-10-CM | POA: Diagnosis not present

## 2017-09-15 DIAGNOSIS — M549 Dorsalgia, unspecified: Secondary | ICD-10-CM | POA: Diagnosis not present

## 2017-09-15 MED ORDER — AMPHETAMINE-DEXTROAMPHETAMINE 30 MG PO TABS: 30.0000 mg | ORAL_TABLET | Freq: Three times a day (TID) | ORAL | 0 refills | Status: DC

## 2017-09-15 MED ORDER — DIAZEPAM 10 MG PO TABS: 10.0000 mg | ORAL_TABLET | Freq: Every evening | ORAL | 2 refills | Status: DC | PRN

## 2017-09-15 NOTE — Progress Notes (Signed)
BH MD/PA/NP OP Progress Note  09/15/2017 8:43 AM Todd Gilbert  MRN:  [id number]  Chief Complaint:  Chief Complaint    ADHD; Anxiety; Follow-up     WUJ:[id number]:This patient is a 46 year old married white male lives with his wife in [place]. He has 3 children who live with his ex-wife in [place]. He works for a Administrator, [employer] company operating a forklift.  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 He was treated with Ritalin. He was "wild" as a teenager and young adult and dropped out of high school. He was in jail numerous times and used to drink and use drugs. Since he's been on Adderall he's been doing much better. He is much less impulsive and he able to stay focused at work. He is eating and sleeping well and doesn't endorse any symptoms of depression or anxiety. At times he is a bit irritable with his family.  Patient returns after 3 months.  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  He is mostly driving a forklift.  His focus has been good on the Adderall.  His blood pressure is only slightly elevated.  I again urged him to quit smoking.  He is sleeping well and his anxiety is under good control with the Valium Visit Diagnosis:    ICD-10-CM   1. ADHD (attention deficit hyperactivity disorder), combined type F90.2 amphetamine-dextroamphetamine (ADDERALL) 30 MG tablet    amphetamine-dextroamphetamine (ADDERALL) 30 MG tablet    amphetamine-dextroamphetamine (ADDERALL) 30 MG tablet    Past Psychiatric History: none  Past Medical History:  Past Medical History:  Diagnosis Date  . ADHD (attention deficit hyperactivity disorder)   . Anxiety   . Bipolar disorder (HCC)   . Depression   . Obsessive-compulsive disorder   . Psychosis (HCC)   . Right sciatic nerve pain     Past Surgical History:  Procedure Laterality Date  . HAND TENDON SURGERY  10/13/1996    Family Psychiatric History: See below  Family History:  Family  History  Problem Relation Age of Onset  . Alcohol abuse Father   . Anxiety disorder Father   . Dementia Maternal Grandmother   . OCD Neg Hx   . ADD / ADHD Neg Hx   . Bipolar disorder Neg Hx   . Depression Neg Hx   . Drug abuse Neg Hx   . Paranoid behavior Neg Hx   . Schizophrenia Neg Hx   . Seizures Neg Hx   . Sexual abuse Neg Hx   . Physical abuse Neg Hx     Social History:  Social History   Socioeconomic History  . Marital status: Married    Spouse name: None  . Number of children: None  . Years of education: None  . Highest education level: None  Social Needs  . Financial resource strain: None  . Food insecurity - worry: None  . Food insecurity - inability: None  . Transportation needs - medical: None  . Transportation needs - non-medical: None  Occupational History  . None  Tobacco Use  . Smoking status: Current Every Day Smoker    Last attempt to quit: 05/13/2012    Years since quitting: 5.3  . Smokeless tobacco: Never Used  . Tobacco comment: quit a year ago  Substance and Sexual Activity  . Alcohol use: No    Alcohol/week: 0.0 oz  . Drug use: No  . Sexual  activity: Yes  Other Topics Concern  . None  Social History Narrative  . None    Allergies:  Allergies  Allergen Reactions  . Lithium Other (See Comments)    Bizarre behavior at night, climbing out the window and bringing mail into the bedroom  . Tegretol [Carbamazepine] Other (See Comments)    Way out there VIVID dreams    Metabolic Disorder Labs: No results found for: HGBA1C, MPG No results found for: PROLACTIN No results found for: CHOL, TRIG, HDL, CHOLHDL, VLDL, LDLCALC No results found for: TSH  Therapeutic Level Labs: No results found for: LITHIUM No results found for: VALPROATE No components found for:  CBMZ  Current Medications: Current Outpatient Medications  Medication Sig Dispense Refill  . amphetamine-dextroamphetamine (ADDERALL) 30 MG tablet Take 1 tablet by mouth 3 (three)  times daily. 90 tablet 0  . amphetamine-dextroamphetamine (ADDERALL) 30 MG tablet Take 1 tablet by mouth 3 (three) times daily. 90 tablet 0  . amphetamine-dextroamphetamine (ADDERALL) 30 MG tablet Take 1 tablet by mouth 3 (three) times daily. 90 tablet 0  . diazepam (VALIUM) 10 MG tablet Take 1 tablet (10 mg total) by mouth at bedtime as needed for anxiety. 30 tablet 2  . losartan (COZAAR) 50 MG tablet Take 50 mg by mouth daily.  2   No current facility-administered medications for this visit.      Musculoskeletal: Strength & Muscle Tone: within normal limits Gait & Station: normal Patient leans: N/A  Psychiatric Specialty Exam: Review of Systems  Musculoskeletal: Positive for back pain.  All other systems reviewed and are negative.   Blood pressure (!) 142/84, pulse 75, height 6' (1.829 m), weight 214 lb (97.1 kg), SpO2 99 %.Body mass index is 29.02 kg/m.  General Appearance: Casual and Fairly Groomed  Eye Contact:  Good  Speech:  Clear and Coherent  Volume:  Normal  Mood:  Euthymic  Affect:  Congruent  Thought Process:  Goal Directed  Orientation:  Full (Time, Place, and Person)  Thought Content: WDL   Suicidal Thoughts:  No  Homicidal Thoughts:  No  Memory:  Immediate;   Good Recent;   Good Remote;   Good  Judgement:  Good  Insight:  Fair  Psychomotor Activity:  Normal  Concentration:  Concentration: Good and Attention Span: Good  Recall:  Good  Fund of Knowledge: Good  Language: Good  Akathisia:  No  Handed:  Right  AIMS (if indicated): not done  Assets:  Communication Skills Desire for Improvement Physical Health Resilience Social Support Talents/Skills  ADL's:  Intact  Cognition: WNL  Sleep:  Good   Screenings:   Assessment and Plan: Patient is a 46 year old male with a history of ADHD and anxiety.  He is doing well on Adderall 30 mg 3 times a day without side effects for his ADHD and Valium 10 mg at bedtime for sleep and anxiety.  He will continue  these medications and return to see me in 3 months   Diannia Rudereborah Ross, MD 09/15/2017, 8:43 AM

## 2017-12-14 ENCOUNTER — Other Ambulatory Visit (HOSPITAL_COMMUNITY): Payer: Self-pay | Admitting: Psychiatry

## 2017-12-14 ENCOUNTER — Encounter (HOSPITAL_COMMUNITY): Payer: Self-pay | Admitting: Psychiatry

## 2017-12-14 ENCOUNTER — Ambulatory Visit (INDEPENDENT_AMBULATORY_CARE_PROVIDER_SITE_OTHER): Payer: 59 | Admitting: Psychiatry

## 2017-12-14 ENCOUNTER — Telehealth (HOSPITAL_COMMUNITY): Payer: Self-pay | Admitting: *Deleted

## 2017-12-14 DIAGNOSIS — Z810 Family history of intellectual disabilities: Secondary | ICD-10-CM

## 2017-12-14 DIAGNOSIS — F902 Attention-deficit hyperactivity disorder, combined type: Secondary | ICD-10-CM | POA: Diagnosis not present

## 2017-12-14 DIAGNOSIS — Z818 Family history of other mental and behavioral disorders: Secondary | ICD-10-CM | POA: Diagnosis not present

## 2017-12-14 DIAGNOSIS — F17210 Nicotine dependence, cigarettes, uncomplicated: Secondary | ICD-10-CM | POA: Diagnosis not present

## 2017-12-14 DIAGNOSIS — Z811 Family history of alcohol abuse and dependence: Secondary | ICD-10-CM

## 2017-12-14 MED ORDER — AMPHETAMINE-DEXTROAMPHETAMINE 30 MG PO TABS: 30.0000 mg | ORAL_TABLET | Freq: Three times a day (TID) | ORAL | 0 refills | Status: DC

## 2017-12-14 MED ORDER — DIAZEPAM 10 MG PO TABS: 10.0000 mg | ORAL_TABLET | Freq: Every evening | ORAL | 2 refills | Status: DC | PRN

## 2017-12-14 NOTE — Progress Notes (Signed)
BH MD/PA/NP OP Progress Note  12/14/2017 8:25 AM Todd Gilbert  MRN:  [id number]  Chief Complaint:  Chief Complaint    ADHD; Follow-up     MWN:UUVO patient is a 47 year old married white male lives with his wife in [place]. He has 3 children who live with his ex-wife in [place]. He works for a Administrator, arts.  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 He was treated with Ritalin. He was "wild" as a teenager and young adult and dropped out of high school. He was in jail numerous times and used to drink and use drugs. Since he's been on Adderall he's been doing much better. He is much less impulsive and he able to stay focused at work. He is eating and sleeping well and doesn't endorse any symptoms of depression or anxiety. At times he is a bit irritable with his family.  The patient returns after 3 months.  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  His focus has been quite good on the Adderall.  His blood pressure is elevated today but he states that he ran out of his losartan.  I urged him to be compliant with it and also to try to quit smoking.  He is sleeping well his anxiety is under good control with the Valium.  He has no specific complaints today Visit Diagnosis:    ICD-10-CM   1. ADHD (attention deficit hyperactivity disorder), combined type F90.2 amphetamine-dextroamphetamine (ADDERALL) 30 MG tablet    amphetamine-dextroamphetamine (ADDERALL) 30 MG tablet    amphetamine-dextroamphetamine (ADDERALL) 30 MG tablet    Past Psychiatric History: none  Past Medical History:  Past Medical History:  Diagnosis Date  . ADHD (attention deficit hyperactivity disorder)   . Anxiety   . Bipolar disorder (HCC)   . Depression   . Obsessive-compulsive disorder   . Psychosis (HCC)   . Right sciatic nerve pain     Past Surgical History:  Procedure Laterality Date  . HAND TENDON SURGERY  10/13/1996    Family  Psychiatric History: See below  Family History:  Family History  Problem Relation Age of Onset  . Alcohol abuse Father   . Anxiety disorder Father   . Dementia Maternal Grandmother   . OCD Neg Hx   . ADD / ADHD Neg Hx   . Bipolar disorder Neg Hx   . Depression Neg Hx   . Drug abuse Neg Hx   . Paranoid behavior Neg Hx   . Schizophrenia Neg Hx   . Seizures Neg Hx   . Sexual abuse Neg Hx   . Physical abuse Neg Hx     Social History:  Social History   Socioeconomic History  . Marital status: Married    Spouse name: None  . Number of children: None  . Years of education: None  . Highest education level: None  Social Needs  . Financial resource strain: None  . Food insecurity - worry: None  . Food insecurity - inability: None  . Transportation needs - medical: None  . Transportation needs - non-medical: None  Occupational History  . None  Tobacco Use  . Smoking status: Current Every Day Smoker    Last attempt to quit: 05/13/2012    Years since quitting: 5.5  . Smokeless tobacco: Never Used  . Tobacco comment: quit a year ago  Substance and Sexual Activity  . Alcohol use: No  Alcohol/week: 0.0 oz  . Drug use: No  . Sexual activity: Yes  Other Topics Concern  . None  Social History Narrative  . None    Allergies:  Allergies  Allergen Reactions  . Lithium Other (See Comments)    Bizarre behavior at night, climbing out the window and bringing mail into the bedroom  . Tegretol [Carbamazepine] Other (See Comments)    Way out there VIVID dreams    Metabolic Disorder Labs: No results found for: HGBA1C, MPG No results found for: PROLACTIN No results found for: CHOL, TRIG, HDL, CHOLHDL, VLDL, LDLCALC No results found for: TSH  Therapeutic Level Labs: No results found for: LITHIUM No results found for: VALPROATE No components found for:  CBMZ  Current Medications: Current Outpatient Medications  Medication Sig Dispense Refill  .  amphetamine-dextroamphetamine (ADDERALL) 30 MG tablet Take 1 tablet by mouth 3 (three) times daily. 90 tablet 0  . amphetamine-dextroamphetamine (ADDERALL) 30 MG tablet Take 1 tablet by mouth 3 (three) times daily. 90 tablet 0  . amphetamine-dextroamphetamine (ADDERALL) 30 MG tablet Take 1 tablet by mouth 3 (three) times daily. 90 tablet 0  . diazepam (VALIUM) 10 MG tablet Take 1 tablet (10 mg total) by mouth at bedtime as needed for anxiety. 30 tablet 2  . losartan (COZAAR) 50 MG tablet Take 50 mg by mouth daily.  2   No current facility-administered medications for this visit.      Musculoskeletal: Strength & Muscle Tone: within normal limits Gait & Station: normal Patient leans: N/A  Psychiatric Specialty Exam: Review of Systems  All other systems reviewed and are negative.   Blood pressure (!) 166/93, pulse 74, height 6' (1.829 m), weight 222 lb (100.7 kg), SpO2 99 %.Body mass index is 30.11 kg/m.  General Appearance: Casual, Neat and Well Groomed  Eye Contact:  Good  Speech:  Clear and Coherent  Volume:  Normal  Mood:  Euthymic  Affect:  Congruent  Thought Process:  Goal Directed  Orientation:  Full (Time, Place, and Person)  Thought Content: WDL   Suicidal Thoughts:  No  Homicidal Thoughts:  No  Memory:  Immediate;   Good Recent;   Good Remote;   NA  Judgement:  Fair  Insight:  Fair  Psychomotor Activity:  Normal  Concentration:  Concentration: Good and Attention Span: Good  Recall:  Good  Fund of Knowledge: Good  Language: Good  Akathisia:  No  Handed:  Right  AIMS (if indicated): not done  Assets:  Communication Skills Desire for Improvement Physical Health Resilience Social Support Talents/Skills  ADL's:  Intact  Cognition: WNL  Sleep:  Good   Screenings:   Assessment and Plan: Patient is a 47 year old male with a history of ADHD and anxiety.  He seems to be doing quite well on his medications.  He will continue Adderall 30 mg 3 times daily for focus  and Valium 10 mg at bedtime as needed for anxiety or sleep.  I have urged him to stay on his losartan and to try to cut back his cigarettes.  He will return to see me in 3 months   Diannia Rudereborah Ross, MD 12/14/2017, 8:25 AM

## 2017-12-14 NOTE — Telephone Encounter (Signed)
Dr Elmarie Shileyoss [facility] called & they are out of stock on the Adderall. [facility] Back ordered until next month.  [facility] & [facility] are out of stock as well. So Rx for today has been discarded & pharmacisit  has searched & [facility] has stock of medication. Spoke with patient & he is okay   transferring this month script to [facility]. New Rx will be required. Rx updated in sysytem

## 2017-12-14 NOTE — Telephone Encounter (Signed)
All 3 scripts sent to [facility]

## 2018-01-14 ENCOUNTER — Telehealth (HOSPITAL_COMMUNITY): Payer: Self-pay | Admitting: *Deleted

## 2018-01-14 ENCOUNTER — Other Ambulatory Visit (HOSPITAL_COMMUNITY): Payer: Self-pay | Admitting: Psychiatry

## 2018-01-14 DIAGNOSIS — F902 Attention-deficit hyperactivity disorder, combined type: Secondary | ICD-10-CM

## 2018-01-14 MED ORDER — AMPHETAMINE-DEXTROAMPHETAMINE 30 MG PO TABS: 30.0000 mg | ORAL_TABLET | Freq: Three times a day (TID) | ORAL | 0 refills | Status: DC

## 2018-01-14 NOTE — Telephone Encounter (Signed)
done

## 2018-01-14 NOTE — Telephone Encounter (Signed)
Dr Tenny Crawoss  Per [facility]. Adderrall now has to be refilled thru [facility]. Pharmacy updated in system. New refills requested

## 2018-01-15 MED FILL — AMPHETAMINE SALTS 30 MG TAB: 30 | 30 days supply | Qty: 90 | Fill #0

## 2018-01-18 ENCOUNTER — Telehealth (HOSPITAL_COMMUNITY): Payer: Self-pay | Admitting: *Deleted

## 2018-01-18 NOTE — Telephone Encounter (Signed)
Authorization Approval  for Adderall max 12 refills  Good from 01/15/2018 -- 01/15/2019. [facility]   And patient notified

## 2018-03-16 ENCOUNTER — Encounter (HOSPITAL_COMMUNITY): Payer: Self-pay | Admitting: Psychiatry

## 2018-03-16 ENCOUNTER — Ambulatory Visit (INDEPENDENT_AMBULATORY_CARE_PROVIDER_SITE_OTHER): Payer: 59 | Admitting: Psychiatry

## 2018-03-16 DIAGNOSIS — Z811 Family history of alcohol abuse and dependence: Secondary | ICD-10-CM

## 2018-03-16 DIAGNOSIS — Z79899 Other long term (current) drug therapy: Secondary | ICD-10-CM | POA: Diagnosis not present

## 2018-03-16 DIAGNOSIS — F17200 Nicotine dependence, unspecified, uncomplicated: Secondary | ICD-10-CM | POA: Diagnosis not present

## 2018-03-16 DIAGNOSIS — F902 Attention-deficit hyperactivity disorder, combined type: Secondary | ICD-10-CM

## 2018-03-16 DIAGNOSIS — Z818 Family history of other mental and behavioral disorders: Secondary | ICD-10-CM

## 2018-03-16 DIAGNOSIS — F419 Anxiety disorder, unspecified: Secondary | ICD-10-CM

## 2018-03-16 MED ORDER — AMPHETAMINE-DEXTROAMPHETAMINE 30 MG PO TABS: 30.0000 mg | ORAL_TABLET | Freq: Three times a day (TID) | ORAL | 0 refills | Status: DC

## 2018-03-16 MED ORDER — DIAZEPAM 10 MG PO TABS: 10.0000 mg | ORAL_TABLET | Freq: Every evening | ORAL | 2 refills | Status: DC | PRN

## 2018-03-16 NOTE — Progress Notes (Signed)
BH MD/PA/NP OP Progress Note  03/16/2018 8:22 AM Todd Gilbert  MRN:  [id number]  Chief Complaint:  Chief Complaint    ADHD; Follow-up     HPI: This patient is a 47 year old married white male lives with his wife in [place]. He has 3 children who live with his ex-wife in [place]. He works for a Administrator, arts.  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 He was treated with Ritalin. He was "wild" as a teenager and young adult and dropped out of high school. He was in jail numerous times and used to drink and use drugs. Since he's been on Adderall he's been doing much better. He is much less impulsive and he able to stay focused at work. He is eating and sleeping well and doesn't endorse any symptoms of depression or anxiety. At times he is a bit irritable with his family.  The patient returns after 3 months.  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  His blood pressure is again high and he again states he ran out of his losartan.  He admits that he is drinking beer pretty much every night and I urged him to slow down on this.  He does not use the Valium when he drinks beer.  He is also wanting to quit smoking.  He sleeps well and his anxiety is under good control.  The Adderall is continued to help with his focus. Visit Diagnosis:    ICD-10-CM   1. ADHD (attention deficit hyperactivity disorder), combined type F90.2 amphetamine-dextroamphetamine (ADDERALL) 30 MG tablet    amphetamine-dextroamphetamine (ADDERALL) 30 MG tablet    amphetamine-dextroamphetamine (ADDERALL) 30 MG tablet    Past Psychiatric History: none  Past Medical History:  Past Medical History:  Diagnosis Date  . ADHD (attention deficit hyperactivity disorder)   . Anxiety   . Bipolar disorder (HCC)   . Depression   . Obsessive-compulsive disorder   . Psychosis (HCC)   . Right sciatic nerve pain     Past Surgical  History:  Procedure Laterality Date  . HAND TENDON SURGERY  10/13/1996    Family Psychiatric History: See below  Family History:  Family History  Problem Relation Age of Onset  . Alcohol abuse Father   . Anxiety disorder Father   . Dementia Maternal Grandmother   . OCD Neg Hx   . ADD / ADHD Neg Hx   . Bipolar disorder Neg Hx   . Depression Neg Hx   . Drug abuse Neg Hx   . Paranoid behavior Neg Hx   . Schizophrenia Neg Hx   . Seizures Neg Hx   . Sexual abuse Neg Hx   . Physical abuse Neg Hx     Social History:  Social History   Socioeconomic History  . Marital status: Married    Spouse name: Not on file  . Number of children: Not on file  . Years of education: Not on file  . Highest education level: Not on file  Occupational History  . Not on file  Social Needs  . Financial resource strain: Not on file  . Food insecurity:    Worry: Not on file    Inability: Not on file  . Transportation needs:    Medical: Not on file    Non-medical: Not on file  Tobacco Use  . Smoking status: Current Every Day Smoker  Last attempt to quit: 05/13/2012    Years since quitting: 5.8  . Smokeless tobacco: Never Used  . Tobacco comment: quit a year ago  Substance and Sexual Activity  . Alcohol use: No    Alcohol/week: 0.0 oz  . Drug use: No  . Sexual activity: Yes  Lifestyle  . Physical activity:    Days per week: Not on file    Minutes per session: Not on file  . Stress: Not on file  Relationships  . Social connections:    Talks on phone: Not on file    Gets together: Not on file    Attends religious service: Not on file    Active member of club or organization: Not on file    Attends meetings of clubs or organizations: Not on file    Relationship status: Not on file  Other Topics Concern  . Not on file  Social History Narrative  . Not on file    Allergies:  Allergies  Allergen Reactions  . Lithium Other (See Comments)    Bizarre behavior at night, climbing out  the window and bringing mail into the bedroom  . Tegretol [Carbamazepine] Other (See Comments)    Way out there VIVID dreams    Metabolic Disorder Labs: No results found for: HGBA1C, MPG No results found for: PROLACTIN No results found for: CHOL, TRIG, HDL, CHOLHDL, VLDL, LDLCALC No results found for: TSH  Therapeutic Level Labs: No results found for: LITHIUM No results found for: VALPROATE No components found for:  CBMZ  Current Medications: Current Outpatient Medications  Medication Sig Dispense Refill  . amphetamine-dextroamphetamine (ADDERALL) 30 MG tablet Take 1 tablet by mouth 3 (three) times daily. 90 tablet 0  . amphetamine-dextroamphetamine (ADDERALL) 30 MG tablet Take 1 tablet by mouth 3 (three) times daily. 90 tablet 0  . amphetamine-dextroamphetamine (ADDERALL) 30 MG tablet Take 1 tablet by mouth 3 (three) times daily. 90 tablet 0  . diazepam (VALIUM) 10 MG tablet Take 1 tablet (10 mg total) by mouth at bedtime as needed for anxiety. 30 tablet 2  . losartan (COZAAR) 50 MG tablet Take 50 mg by mouth daily.  2   No current facility-administered medications for this visit.      Musculoskeletal: Strength & Muscle Tone: within normal limits Gait & Station: normal Patient leans: N/A  Psychiatric Specialty Exam: Review of Systems  All other systems reviewed and are negative.   Blood pressure (!) 156/96, pulse 72, height 6' (1.829 m), weight 222 lb (100.7 kg), SpO2 99 %.Body mass index is 30.11 kg/m.  General Appearance: Casual and Fairly Groomed  Eye Contact:  Good  Speech:  Clear and Coherent  Volume:  Normal  Mood:  Euthymic  Affect:  Congruent  Thought Process:  Goal Directed  Orientation:  Full (Time, Place, and Person)  Thought Content: WDL   Suicidal Thoughts:  No  Homicidal Thoughts:  No  Memory:  Immediate;   Good Recent;   Good Remote;   Good  Judgement:  Fair  Insight:  Fair  Psychomotor Activity:  Normal  Concentration:  Concentration: Good and  Attention Span: Good  Recall:  Good  Fund of Knowledge: Good  Language: Good  Akathisia:  No  Handed:  Right  AIMS (if indicated): not done  Assets:  Communication Skills Desire for Improvement Physical Health Resilience Social Support Talents/Skills  ADL's:  Intact  Cognition: WNL  Sleep:  Good   Screenings:   Assessment and Plan: This patient is a  47 year old male with a history of ADHD and some anxiety.  I am concerned about the amount of drinking that he does he admits to 12 beers a day which she has done since age 47.  He is going to talk to his family doctor about this.  He was urged not to use Valium when he drinks and he states he will not.  He will continue Valium 10 mg at bedtime only as needed for anxiety.  He will continue at Adderall 30 mg 3 times daily for focus.  He is going to talk to his family doctor about the alcohol smoking and the hypertension.  He will return to see me in 3 months   Diannia Rudereborah Aprile Dickenson, MD 03/16/2018, 8:22 AM

## 2018-04-02 DIAGNOSIS — I10 Essential (primary) hypertension: Secondary | ICD-10-CM | POA: Diagnosis not present

## 2018-04-08 DIAGNOSIS — F39 Unspecified mood [affective] disorder: Secondary | ICD-10-CM | POA: Diagnosis not present

## 2018-04-08 DIAGNOSIS — R945 Abnormal results of liver function studies: Secondary | ICD-10-CM | POA: Diagnosis not present

## 2018-04-08 DIAGNOSIS — Z0000 Encounter for general adult medical examination without abnormal findings: Secondary | ICD-10-CM | POA: Diagnosis not present

## 2018-04-08 DIAGNOSIS — F209 Schizophrenia, unspecified: Secondary | ICD-10-CM | POA: Diagnosis not present

## 2018-04-08 DIAGNOSIS — M25529 Pain in unspecified elbow: Secondary | ICD-10-CM | POA: Diagnosis not present

## 2018-04-08 DIAGNOSIS — M25569 Pain in unspecified knee: Secondary | ICD-10-CM | POA: Diagnosis not present

## 2018-04-08 DIAGNOSIS — H9209 Otalgia, unspecified ear: Secondary | ICD-10-CM | POA: Diagnosis not present

## 2018-04-08 DIAGNOSIS — L729 Follicular cyst of the skin and subcutaneous tissue, unspecified: Secondary | ICD-10-CM | POA: Diagnosis not present

## 2018-04-08 DIAGNOSIS — I10 Essential (primary) hypertension: Secondary | ICD-10-CM | POA: Diagnosis not present

## 2018-05-12 DIAGNOSIS — Z6829 Body mass index (BMI) 29.0-29.9, adult: Secondary | ICD-10-CM | POA: Diagnosis not present

## 2018-05-12 DIAGNOSIS — I10 Essential (primary) hypertension: Secondary | ICD-10-CM | POA: Diagnosis not present

## 2018-06-15 ENCOUNTER — Ambulatory Visit (INDEPENDENT_AMBULATORY_CARE_PROVIDER_SITE_OTHER): Payer: 59 | Admitting: Psychiatry

## 2018-06-15 ENCOUNTER — Encounter (HOSPITAL_COMMUNITY): Payer: Self-pay | Admitting: Psychiatry

## 2018-06-15 ENCOUNTER — Telehealth (HOSPITAL_COMMUNITY): Payer: Self-pay

## 2018-06-15 ENCOUNTER — Other Ambulatory Visit (HOSPITAL_COMMUNITY): Payer: Self-pay | Admitting: Psychiatry

## 2018-06-15 DIAGNOSIS — F17210 Nicotine dependence, cigarettes, uncomplicated: Secondary | ICD-10-CM | POA: Diagnosis not present

## 2018-06-15 DIAGNOSIS — Z811 Family history of alcohol abuse and dependence: Secondary | ICD-10-CM | POA: Diagnosis not present

## 2018-06-15 DIAGNOSIS — F902 Attention-deficit hyperactivity disorder, combined type: Secondary | ICD-10-CM | POA: Diagnosis not present

## 2018-06-15 DIAGNOSIS — Z818 Family history of other mental and behavioral disorders: Secondary | ICD-10-CM | POA: Diagnosis not present

## 2018-06-15 MED ORDER — AMPHETAMINE-DEXTROAMPHETAMINE 30 MG PO TABS: 30.0000 mg | ORAL_TABLET | Freq: Three times a day (TID) | ORAL | 0 refills | Status: DC

## 2018-06-15 MED ORDER — DIAZEPAM 10 MG PO TABS: 10.0000 mg | ORAL_TABLET | Freq: Every evening | ORAL | 2 refills | Status: DC | PRN

## 2018-06-15 NOTE — Telephone Encounter (Signed)
sent 

## 2018-06-15 NOTE — Progress Notes (Signed)
BH MD/PA/NP OP Progress Note  06/15/2018 8:21 AM Todd Gilbert  MRN:  [id number]  Chief Complaint:  Chief Complaint    ADHD; Follow-up     HPI: This patient is a 47 year old married white male lives with his wife in [place]. He has 3 children who live with his ex-wife in [place]. He works for a Administrator, arts.  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 He was treated with Ritalin. He was "wild" as a teenager and young adult and dropped out of high school. He was in jail numerous times and used to drink and use drugs. Since he's been on Adderall he's been doing much better. He is much less impulsive and he able to stay focused at work. He is eating and sleeping well and doesn't endorse any symptoms of depression or anxiety. At times he is a bit irritable with his family.  The patient returns after 3 months.  For the most part he is doing well.  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  His blood pressure is again high and he admits he is had a hard time trying to quit smoking.  He still drinks several beers a day and I have urged him to cut this down.  The Adderall however continues to help his focus and he occasionally uses Valium to help with sleep.  He again promises that he is going to cut down on the drinking. Visit Diagnosis:    ICD-10-CM   1. ADHD (attention deficit hyperactivity disorder), combined type F90.2 amphetamine-dextroamphetamine (ADDERALL) 30 MG tablet    amphetamine-dextroamphetamine (ADDERALL) 30 MG tablet    amphetamine-dextroamphetamine (ADDERALL) 30 MG tablet    Past Psychiatric History: none  Past Medical History:  Past Medical History:  Diagnosis Date  . ADHD (attention deficit hyperactivity disorder)   . Anxiety   . Bipolar disorder (HCC)   . Depression   . Obsessive-compulsive disorder   . Psychosis (HCC)   . Right sciatic nerve pain     Past Surgical History:  Procedure  Laterality Date  . HAND TENDON SURGERY  10/13/1996    Family Psychiatric History: See below  Family History:  Family History  Problem Relation Age of Onset  . Alcohol abuse Father   . Anxiety disorder Father   . Dementia Maternal Grandmother   . OCD Neg Hx   . ADD / ADHD Neg Hx   . Bipolar disorder Neg Hx   . Depression Neg Hx   . Drug abuse Neg Hx   . Paranoid behavior Neg Hx   . Schizophrenia Neg Hx   . Seizures Neg Hx   . Sexual abuse Neg Hx   . Physical abuse Neg Hx     Social History:  Social History   Socioeconomic History  . Marital status: Married    Spouse name: Not on file  . Number of children: Not on file  . Years of education: Not on file  . Highest education level: Not on file  Occupational History  . Not on file  Social Needs  . Financial resource strain: Not on file  . Food insecurity:    Worry: Not on file    Inability: Not on file  . Transportation needs:    Medical: Not on file    Non-medical: Not on file  Tobacco Use  . Smoking status: Current Every Day Smoker    Last attempt to quit:  05/13/2012    Years since quitting: 6.0  . Smokeless tobacco: Never Used  . Tobacco comment: quit a year ago  Substance and Sexual Activity  . Alcohol use: No    Alcohol/week: 0.0 standard drinks  . Drug use: No  . Sexual activity: Yes  Lifestyle  . Physical activity:    Days per week: Not on file    Minutes per session: Not on file  . Stress: Not on file  Relationships  . Social connections:    Talks on phone: Not on file    Gets together: Not on file    Attends religious service: Not on file    Active member of club or organization: Not on file    Attends meetings of clubs or organizations: Not on file    Relationship status: Not on file  Other Topics Concern  . Not on file  Social History Narrative  . Not on file    Allergies:  Allergies  Allergen Reactions  . Lithium Other (See Comments)    Bizarre behavior at night, climbing out the window  and bringing mail into the bedroom  . Tegretol [Carbamazepine] Other (See Comments)    Way out there VIVID dreams    Metabolic Disorder Labs: No results found for: HGBA1C, MPG No results found for: PROLACTIN No results found for: CHOL, TRIG, HDL, CHOLHDL, VLDL, LDLCALC No results found for: TSH  Therapeutic Level Labs: No results found for: LITHIUM No results found for: VALPROATE No components found for:  CBMZ  Current Medications: Current Outpatient Medications  Medication Sig Dispense Refill  . amphetamine-dextroamphetamine (ADDERALL) 30 MG tablet Take 1 tablet by mouth 3 (three) times daily. 90 tablet 0  . amphetamine-dextroamphetamine (ADDERALL) 30 MG tablet Take 1 tablet by mouth 3 (three) times daily. 90 tablet 0  . amphetamine-dextroamphetamine (ADDERALL) 30 MG tablet Take 1 tablet by mouth 3 (three) times daily. 90 tablet 0  . diazepam (VALIUM) 10 MG tablet Take 1 tablet (10 mg total) by mouth at bedtime as needed for anxiety. 30 tablet 2  . losartan (COZAAR) 50 MG tablet Take 50 mg by mouth daily.  2   No current facility-administered medications for this visit.      Musculoskeletal: Strength & Muscle Tone: within normal limits Gait & Station: normal Patient leans: N/A  Psychiatric Specialty Exam: Review of Systems  All other systems reviewed and are negative.   Blood pressure (!) 149/94, pulse 64, height 6' (1.829 m), weight 217 lb (98.4 kg), SpO2 100 %.Body mass index is 29.43 kg/m.  General Appearance: Casual and Fairly Groomed  Eye Contact:  Good  Speech:  Clear and Coherent  Volume:  Normal  Mood:  Euthymic  Affect:  Congruent  Thought Process:  Goal Directed  Orientation:  Full (Time, Place, and Person)  Thought Content: WDL   Suicidal Thoughts:  No  Homicidal Thoughts:  No  Memory:  Immediate;   Good Recent;   Good Remote;   Fair  Judgement:  Fair  Insight:  Lacking  Psychomotor Activity:  Normal  Concentration:  Concentration: Fair and  Attention Span: Fair  Recall:  Good  Fund of Knowledge: Good  Language: Good  Akathisia:  No  Handed:  Right  AIMS (if indicated): not done  Assets:  Communication Skills Desire for Improvement Physical Health Resilience Social Support Talents/Skills  ADL's:  Intact  Cognition: WNL  Sleep:  Good   Screenings:   Assessment and Plan: This patient is a 47 year old male with  a history of ADHD and anxiety.  His blood pressure is still high and I have urged him to try to quit smoking.  He does well on his current regimen but if he continues to have hypertension we may have to modify it.  He will continue Adderall 30 mg 3 times daily for ADHD and Valium 10 mg at bedtime as needed for sleep.  He will return to see me in 3 months   [name], MD 06/15/2018, 8:21 AM

## 2018-06-15 NOTE — Telephone Encounter (Signed)
Patient called and said can his medication be resubmitted to [facility]. [facility] scripts were cancelled out.

## 2018-09-14 ENCOUNTER — Ambulatory Visit (INDEPENDENT_AMBULATORY_CARE_PROVIDER_SITE_OTHER): Payer: 59 | Admitting: Psychiatry

## 2018-09-14 ENCOUNTER — Encounter (HOSPITAL_COMMUNITY): Payer: Self-pay | Admitting: Psychiatry

## 2018-09-14 DIAGNOSIS — F902 Attention-deficit hyperactivity disorder, combined type: Secondary | ICD-10-CM

## 2018-09-14 MED ORDER — AMPHETAMINE-DEXTROAMPHETAMINE 30 MG PO TABS: 30.0000 mg | ORAL_TABLET | Freq: Three times a day (TID) | ORAL | 0 refills | Status: DC

## 2018-09-14 MED ORDER — DIAZEPAM 10 MG PO TABS: 10.0000 mg | ORAL_TABLET | Freq: Every evening | ORAL | 2 refills | Status: DC | PRN

## 2018-09-14 NOTE — Progress Notes (Signed)
BH MD/PA/NP OP Progress Note  09/14/2018 8:25 AM Todd Gilbert  MRN:  [id number]  Chief Complaint:  Chief Complaint    ADHD; Follow-up     HPI: This patient is a 47 year old married white male lives with his wife in [place]. He has 3 children who live with his ex-wife in [place]. He works for a Administrator, arts.  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 He was treated with Ritalin. He was "wild" as a teenager and young adult and dropped out of high school. He was in jail numerous times and used to drink and use drugs. Since he's been on Adderall he's been doing much better. He is much less impulsive and he able to stay focused at work. He is eating and sleeping well and doesn't endorse any symptoms of depression or anxiety. At times he is a bit irritable with his family.  The patient returns after 3 months.  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  He is liking it so far and is excited about getting benefits.  His mood is good.  He is staying well focused.  The Valium continues to help with sleep and anxiety.  His blood pressure still a bit high and I urged him to try to at least cut down his cigarettes.  He is smoking 1 pack a day Visit Diagnosis:    ICD-10-CM   1. ADHD (attention deficit hyperactivity disorder), combined type F90.2 amphetamine-dextroamphetamine (ADDERALL) 30 MG tablet    amphetamine-dextroamphetamine (ADDERALL) 30 MG tablet    amphetamine-dextroamphetamine (ADDERALL) 30 MG tablet    Past Psychiatric History: none  Past Medical History:  Past Medical History:  Diagnosis Date  . ADHD (attention deficit hyperactivity disorder)   . Anxiety   . Bipolar disorder (HCC)   . Depression   . Obsessive-compulsive disorder   . Psychosis (HCC)   . Right sciatic nerve pain     Past Surgical History:  Procedure Laterality Date  . HAND TENDON SURGERY  10/13/1996    Family  Psychiatric History: See below  Family History:  Family History  Problem Relation Age of Onset  . Alcohol abuse Father   . Anxiety disorder Father   . Dementia Maternal Grandmother   . OCD Neg Hx   . ADD / ADHD Neg Hx   . Bipolar disorder Neg Hx   . Depression Neg Hx   . Drug abuse Neg Hx   . Paranoid behavior Neg Hx   . Schizophrenia Neg Hx   . Seizures Neg Hx   . Sexual abuse Neg Hx   . Physical abuse Neg Hx     Social History:  Social History   Socioeconomic History  . Marital status: Married    Spouse name: Not on file  . Number of children: Not on file  . Years of education: Not on file  . Highest education level: Not on file  Occupational History  . Not on file  Social Needs  . Financial resource strain: Not on file  . Food insecurity:    Worry: Not on file    Inability: Not on file  . Transportation needs:    Medical: Not on file    Non-medical: Not on file  Tobacco Use  . Smoking status: Current Every Day Smoker    Last attempt to quit: 05/13/2012    Years since quitting: 6.3  . Smokeless tobacco:  Never Used  . Tobacco comment: quit a year ago  Substance and Sexual Activity  . Alcohol use: No    Alcohol/week: 0.0 standard drinks  . Drug use: No  . Sexual activity: Yes  Lifestyle  . Physical activity:    Days per week: Not on file    Minutes per session: Not on file  . Stress: Not on file  Relationships  . Social connections:    Talks on phone: Not on file    Gets together: Not on file    Attends religious service: Not on file    Active member of club or organization: Not on file    Attends meetings of clubs or organizations: Not on file    Relationship status: Not on file  Other Topics Concern  . Not on file  Social History Narrative  . Not on file    Allergies:  Allergies  Allergen Reactions  . Lithium Other (See Comments)    Bizarre behavior at night, climbing out the window and bringing mail into the bedroom  . Tegretol  [Carbamazepine] Other (See Comments)    Way out there VIVID dreams    Metabolic Disorder Labs: No results found for: HGBA1C, MPG No results found for: PROLACTIN No results found for: CHOL, TRIG, HDL, CHOLHDL, VLDL, LDLCALC No results found for: TSH  Therapeutic Level Labs: No results found for: LITHIUM No results found for: VALPROATE No components found for:  CBMZ  Current Medications: Current Outpatient Medications  Medication Sig Dispense Refill  . amphetamine-dextroamphetamine (ADDERALL) 30 MG tablet Take 1 tablet by mouth 3 (three) times daily. 90 tablet 0  . amphetamine-dextroamphetamine (ADDERALL) 30 MG tablet Take 1 tablet by mouth 3 (three) times daily. 90 tablet 0  . amphetamine-dextroamphetamine (ADDERALL) 30 MG tablet Take 1 tablet by mouth 3 (three) times daily. 90 tablet 0  . diazepam (VALIUM) 10 MG tablet Take 1 tablet (10 mg total) by mouth at bedtime as needed for anxiety. 30 tablet 2  . losartan (COZAAR) 50 MG tablet Take 50 mg by mouth daily.  2   No current facility-administered medications for this visit.      Musculoskeletal: Strength & Muscle Tone: within normal limits Gait & Station: normal Patient leans: N/A  Psychiatric Specialty Exam: Review of Systems  All other systems reviewed and are negative.   Blood pressure (!) 150/90, pulse 78, height 6' (1.829 m), weight 209 lb (94.8 kg), SpO2 99 %.Body mass index is 28.35 kg/m.  General Appearance: Casual and Fairly Groomed  Eye Contact:  Good  Speech:  Clear and Coherent  Volume:  Normal  Mood:  Euthymic  Affect:  Congruent  Thought Process:  Goal Directed  Orientation:  Full (Time, Place, and Person)  Thought Content: WDL   Suicidal Thoughts:  No  Homicidal Thoughts:  No  Memory:  Immediate;   Good Recent;   Good Remote;   Good  Judgement:  Good  Insight:  Fair  Psychomotor Activity:  Normal  Concentration:  Concentration: Good and Attention Span: Good  Recall:  Good  Fund of Knowledge:  Fair  Language: Good  Akathisia:  No  Handed:  Right  AIMS (if indicated): not done  Assets:  Communication Skills Desire for Improvement Physical Health Resilience Social Support Talents/Skills  ADL's:  Intact  Cognition: WNL  Sleep:  Good   Screenings:   Assessment and Plan: Patient is a 47 year old male with a history of ADHD and anxiety.  He is doing well on his  current medications.  He will continue Valium 10 mg at bedtime for sleep anxiety.  He will continue Adderall 30 mg 3 times daily for ADHD.  He will return to see me in 3 months   Diannia Rudereborah Kaedan Richert, MD 09/14/2018, 8:25 AM

## 2018-12-06 ENCOUNTER — Other Ambulatory Visit (HOSPITAL_COMMUNITY): Payer: Self-pay | Admitting: Psychiatry

## 2018-12-14 ENCOUNTER — Ambulatory Visit (INDEPENDENT_AMBULATORY_CARE_PROVIDER_SITE_OTHER): Payer: 59 | Admitting: Psychiatry

## 2018-12-14 ENCOUNTER — Encounter (HOSPITAL_COMMUNITY): Payer: Self-pay | Admitting: Psychiatry

## 2018-12-14 DIAGNOSIS — F902 Attention-deficit hyperactivity disorder, combined type: Secondary | ICD-10-CM | POA: Diagnosis not present

## 2018-12-14 MED ORDER — AMPHETAMINE-DEXTROAMPHETAMINE 30 MG PO TABS: 30.0000 mg | ORAL_TABLET | Freq: Three times a day (TID) | ORAL | 0 refills | Status: DC

## 2018-12-14 MED ORDER — DIAZEPAM 10 MG PO TABS: 10.0000 mg | ORAL_TABLET | Freq: Every evening | ORAL | 2 refills | Status: DC | PRN

## 2018-12-14 NOTE — Progress Notes (Signed)
BH MD/PA/NP OP Progress Note  12/14/2018 8:26 AM Todd Gilbert  MRN:  [id number]  Chief Complaint:  Chief Complaint    ADHD; Anxiety; Follow-up     HPI: This patient is a 48 year old married white male lives with his wife in [place]. He has 3 children who live with his ex-wife in [place]. He works for a Administrator, arts.  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 He was treated with Ritalin. He was "wild" as a teenager and young adult and dropped out of high school. He was in jail numerous times and used to drink and use drugs. Since he's been on Adderall he's been doing much better. He is much less impulsive and he able to stay focused at work. He is eating and sleeping well and doesn't endorse any symptoms of depression or anxiety. At times he is a bit irritable with his family.  The patient returns after 3 months.  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  1 month he works nights in the next month days etc.  This is been rather difficult for him but he is enjoying the work and the benefits.  He does admit that he drinks about a sixpack of beer per day.  I explained that this was too much for his brain and body to handle over time and he is going to try to cut it down.  He does not mix the alcohol with Valium.  He is staying well focused.  His blood pressure is still high today.  He states this happens every time he comes into a doctor's office but it is normal the rest of the time.  He is staying well focused and denies symptoms of depression or anxiety Visit Diagnosis:    ICD-10-CM   1. ADHD (attention deficit hyperactivity disorder), combined type F90.2 amphetamine-dextroamphetamine (ADDERALL) 30 MG tablet    amphetamine-dextroamphetamine (ADDERALL) 30 MG tablet    amphetamine-dextroamphetamine (ADDERALL) 30 MG tablet    Past Psychiatric History: none  Past Medical History:  Past Medical History:  Diagnosis Date  . ADHD (attention  deficit hyperactivity disorder)   . Anxiety   . Bipolar disorder (HCC)   . Depression   . Obsessive-compulsive disorder   . Psychosis (HCC)   . Right sciatic nerve pain     Past Surgical History:  Procedure Laterality Date  . HAND TENDON SURGERY  10/13/1996    Family Psychiatric History: See below  Family History:  Family History  Problem Relation Age of Onset  . Alcohol abuse Father   . Anxiety disorder Father   . Dementia Maternal Grandmother   . OCD Neg Hx   . ADD / ADHD Neg Hx   . Bipolar disorder Neg Hx   . Depression Neg Hx   . Drug abuse Neg Hx   . Paranoid behavior Neg Hx   . Schizophrenia Neg Hx   . Seizures Neg Hx   . Sexual abuse Neg Hx   . Physical abuse Neg Hx     Social History:  Social History   Socioeconomic History  . Marital status: Married    Spouse name: Not on file  . Number of children: Not on file  . Years of education: Not on file  . Highest education level: Not on file  Occupational History  . Not on file  Social Needs  . Financial resource strain: Not on file  . Food insecurity:  Worry: Not on file    Inability: Not on file  . Transportation needs:    Medical: Not on file    Non-medical: Not on file  Tobacco Use  . Smoking status: Current Every Day Smoker    Last attempt to quit: 05/13/2012    Years since quitting: 6.5  . Smokeless tobacco: Never Used  . Tobacco comment: quit a year ago  Substance and Sexual Activity  . Alcohol use: No    Alcohol/week: 0.0 standard drinks  . Drug use: No  . Sexual activity: Yes  Lifestyle  . Physical activity:    Days per week: Not on file    Minutes per session: Not on file  . Stress: Not on file  Relationships  . Social connections:    Talks on phone: Not on file    Gets together: Not on file    Attends religious service: Not on file    Active member of club or organization: Not on file    Attends meetings of clubs or organizations: Not on file    Relationship status: Not on file   Other Topics Concern  . Not on file  Social History Narrative  . Not on file    Allergies:  Allergies  Allergen Reactions  . Lithium Other (See Comments)    Bizarre behavior at night, climbing out the window and bringing mail into the bedroom  . Tegretol [Carbamazepine] Other (See Comments)    Way out there VIVID dreams    Metabolic Disorder Labs: No results found for: HGBA1C, MPG No results found for: PROLACTIN No results found for: CHOL, TRIG, HDL, CHOLHDL, VLDL, LDLCALC No results found for: TSH  Therapeutic Level Labs: No results found for: LITHIUM No results found for: VALPROATE No components found for:  CBMZ  Current Medications: Current Outpatient Medications  Medication Sig Dispense Refill  . amphetamine-dextroamphetamine (ADDERALL) 30 MG tablet Take 1 tablet by mouth 3 (three) times daily. 90 tablet 0  . amphetamine-dextroamphetamine (ADDERALL) 30 MG tablet Take 1 tablet by mouth 3 (three) times daily. 90 tablet 0  . amphetamine-dextroamphetamine (ADDERALL) 30 MG tablet Take 1 tablet by mouth 3 (three) times daily. 90 tablet 0  . diazepam (VALIUM) 10 MG tablet Take 1 tablet (10 mg total) by mouth at bedtime as needed for anxiety. 30 tablet 2  . losartan (COZAAR) 50 MG tablet Take 50 mg by mouth daily.  2   No current facility-administered medications for this visit.      Musculoskeletal: Strength & Muscle Tone: within normal limits Gait & Station: normal Patient leans: N/A  Psychiatric Specialty Exam: Review of Systems  All other systems reviewed and are negative.   Blood pressure (!) 165/90, pulse 76, height 6' (1.829 m), weight 215 lb (97.5 kg), SpO2 98 %.Body mass index is 29.16 kg/m.  General Appearance: Casual and Fairly Groomed  Eye Contact:  Good  Speech:  Clear and Coherent  Volume:  Normal  Mood:  Euthymic  Affect:  Congruent  Thought Process:  Goal Directed  Orientation:  Full (Time, Place, and Person)  Thought Content: Logical    Suicidal Thoughts:  No  Homicidal Thoughts:  No  Memory:  Immediate;   Good Recent;   Good Remote;   Good  Judgement:  Fair  Insight:  Fair  Psychomotor Activity:  Normal  Concentration:  Concentration: Good and Attention Span: Good  Recall:  Good  Fund of Knowledge: Fair  Language: Good  Akathisia:  No  Handed:  Right  AIMS (if indicated): not done  Assets:  Communication Skills Desire for Improvement Physical Health Resilience Social Support Talents/Skills  ADL's:  Intact  Cognition: WNL  Sleep:  Good   Screenings:   Assessment and Plan:  This patient is a 48 year old male with a history of ADHD and anxiety.  He is doing well so far but I have warned him to cut down the drinking and cigarettes and he states he will work on this.  For now he will continue Adderall 30 mg 3 times daily for ADHD and Valium 10 mg at bedtime as needed for anxiety or sleep.  He will return to see me in 3 months  Diannia Rudereborah Talli Kimmer, MD 12/14/2018, 8:26 AM

## 2019-02-16 ENCOUNTER — Telehealth (HOSPITAL_COMMUNITY): Payer: Self-pay | Admitting: *Deleted

## 2019-02-16 NOTE — Telephone Encounter (Signed)
MED IMPACT AUTHORIZED AMPHETAMINE-DEXTROAMPHETAMINE 30 MG TABLETS  EFFECTIVE FOR 12 FILLS FROM 02/12/2019 TO  02/11/2020  REQUEST APPROVES UP TO 3 TABLETS PER DAY

## 2019-03-04 ENCOUNTER — Other Ambulatory Visit (HOSPITAL_COMMUNITY): Payer: Self-pay | Admitting: Psychiatry

## 2019-03-16 ENCOUNTER — Other Ambulatory Visit: Payer: Self-pay

## 2019-03-16 ENCOUNTER — Encounter (HOSPITAL_COMMUNITY): Payer: Self-pay | Admitting: Psychiatry

## 2019-03-16 ENCOUNTER — Ambulatory Visit (INDEPENDENT_AMBULATORY_CARE_PROVIDER_SITE_OTHER): Payer: 59 | Admitting: Psychiatry

## 2019-03-16 DIAGNOSIS — F902 Attention-deficit hyperactivity disorder, combined type: Secondary | ICD-10-CM | POA: Diagnosis not present

## 2019-03-16 MED ORDER — AMPHETAMINE-DEXTROAMPHETAMINE 30 MG PO TABS: 30.0000 mg | ORAL_TABLET | Freq: Three times a day (TID) | ORAL | 0 refills | Status: DC

## 2019-03-16 MED ORDER — DIAZEPAM 10 MG PO TABS: ORAL_TABLET | ORAL | 2 refills | Status: DC

## 2019-03-16 NOTE — Progress Notes (Signed)
Virtual Visit via Telephone Note  I connected with Todd Gilbert on 03/16/19 at  8:20 AM EDT by telephone and verified that I am speaking with the correct person using two identifiers.   I discussed the limitations, risks, security and privacy concerns of performing an evaluation and management service by telephone and the availability of in person appointments. I also discussed with the patient that there may be a patient responsible charge related to this service. The patient expressed understanding and agreed to proceed.       I discussed the assessment and treatment plan with the patient. The patient was provided an opportunity to ask questions and all were answered. The patient agreed with the plan and demonstrated an understanding of the instructions.   The patient was advised to call back or seek an in-person evaluation if the symptoms worsen or if the condition fails to improve as anticipated.  I provided 15 minutes of non-face-to-face time during this encounter.   [name], MD  [employer] MD/PA/NP OP Progress Note  03/16/2019 8:48 AM Todd Gilbert  MRN:  [id number]  Chief Complaint:  Chief Complaint    ADHD; Follow-up; Anxiety     HPI: This patient is a 48 year old married white male lives with his wife in [place]. He has 3 children who live with his ex-wife in [place]. 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 He was treated with Ritalin. He was "wild" as a teenager and young adult and dropped out of high school. He was in jail numerous times and used to drink and use drugs. Since he's been on Adderall he's been doing much better. He is much less impulsive and he able to stay focused at work. He is eating and sleeping well and doesn't endorse any symptoms of depression or anxiety. At times he is a bit irritable with his family.  The patient returns after 3 months.  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  He does not seem particularly concerned about the coronavirus pandemic as he only stays around the same people he works with and does not really go anywhere else.  He states the Adderall is still helping with his focus and Valium is helping him sleep.  He has cut down his drinking and only drinks a few beers on Fridays by his report.  He claims that his blood pressure is been under good control.  He denies depression anxiety or any other symptoms or side effects.   Visit Diagnosis:    ICD-10-CM   1. ADHD (attention deficit hyperactivity disorder), combined type F90.2 amphetamine-dextroamphetamine (ADDERALL) 30 MG tablet    amphetamine-dextroamphetamine (ADDERALL) 30 MG tablet    amphetamine-dextroamphetamine (ADDERALL) 30 MG tablet    Past Psychiatric History:none  Past Medical History:  Past Medical History:  Diagnosis Date  . ADHD (attention deficit hyperactivity disorder)   . Anxiety   . Bipolar disorder (HCC)   . Depression   . Obsessive-compulsive disorder   . Psychosis (HCC)   . Right sciatic nerve pain     Past Surgical History:  Procedure Laterality Date  . HAND TENDON SURGERY  10/13/1996    Family Psychiatric History: see below  Family History:  Family History  Problem Relation Age of Onset  . Alcohol abuse Father   . Anxiety disorder Father   . Dementia Maternal Grandmother   . OCD Neg Hx   .  ADD / ADHD Neg Hx   . Bipolar disorder Neg Hx   . Depression Neg Hx   . Drug abuse Neg Hx   . Paranoid behavior Neg Hx   . Schizophrenia Neg Hx   . Seizures Neg Hx   . Sexual abuse Neg Hx   . Physical abuse Neg Hx     Social History:  Social History   Socioeconomic History  . Marital status: Married    Spouse name: Not on file  . Number of children: Not on file  . Years of education: Not on file  . Highest education level: Not on file  Occupational History  . Not on file  Social Needs  . Financial resource strain: Not on file  .  Food insecurity:    Worry: Not on file    Inability: Not on file  . Transportation needs:    Medical: Not on file    Non-medical: Not on file  Tobacco Use  . Smoking status: Current Every Day Smoker    Last attempt to quit: 05/13/2012    Years since quitting: 6.8  . Smokeless tobacco: Never Used  . Tobacco comment: quit a year ago  Substance and Sexual Activity  . Alcohol use: No    Alcohol/week: 0.0 standard drinks  . Drug use: No  . Sexual activity: Yes  Lifestyle  . Physical activity:    Days per week: Not on file    Minutes per session: Not on file  . Stress: Not on file  Relationships  . Social connections:    Talks on phone: Not on file    Gets together: Not on file    Attends religious service: Not on file    Active member of club or organization: Not on file    Attends meetings of clubs or organizations: Not on file    Relationship status: Not on file  Other Topics Concern  . Not on file  Social History Narrative  . Not on file    Allergies:  Allergies  Allergen Reactions  . Lithium Other (See Comments)    Bizarre behavior at night, climbing out the window and bringing mail into the bedroom  . Tegretol [Carbamazepine] Other (See Comments)    Way out there VIVID dreams    Metabolic Disorder Labs: No results found for: HGBA1C, MPG No results found for: PROLACTIN No results found for: CHOL, TRIG, HDL, CHOLHDL, VLDL, LDLCALC No results found for: TSH  Therapeutic Level Labs: No results found for: LITHIUM No results found for: VALPROATE No components found for:  CBMZ  Current Medications: Current Outpatient Medications  Medication Sig Dispense Refill  . amphetamine-dextroamphetamine (ADDERALL) 30 MG tablet Take 1 tablet by mouth 3 (three) times daily. 90 tablet 0  . amphetamine-dextroamphetamine (ADDERALL) 30 MG tablet Take 1 tablet by mouth 3 (three) times daily. 90 tablet 0  . amphetamine-dextroamphetamine (ADDERALL) 30 MG tablet Take 1 tablet by mouth  3 (three) times daily. 90 tablet 0  . diazepam (VALIUM) 10 MG tablet TAKE 1 TABLET BY MOUTH AT BEDTIME AS NEEDED FOR ANXIETY 30 tablet 2  . losartan (COZAAR) 50 MG tablet Take 50 mg by mouth daily.  2   No current facility-administered medications for this visit.      Musculoskeletal: Strength & Muscle Tone: within normal limits Gait & Station: normal Patient leans: N/A  Psychiatric Specialty Exam: Review of Systems  All other systems reviewed and are negative.   There were no vitals taken for this  visit.There is no height or weight on file to calculate BMI.  General Appearance: NA  Eye Contact:  NA  Speech:  Clear and Coherent  Volume:  Normal  Mood:  Euthymic  Affect:  Appropriate and Congruent  Thought Process:  Goal Directed  Orientation:  Full (Time, Place, and Person)  Thought Content: WDL   Suicidal Thoughts:  No  Homicidal Thoughts:  No  Memory:  Immediate;   Good Recent;   Good Remote;   Fair  Judgement:  Good  Insight:  Fair  Psychomotor Activity:  Normal  Concentration:  Concentration: Good and Attention Span: Good  Recall:  Good  Fund of Knowledge: Fair  Language: Good  Akathisia:  No  Handed:  Right  AIMS (if indicated): not done  Assets:  Communication Skills Desire for Improvement Physical Health Resilience Social Support Talents/Skills  ADL's:  Intact  Cognition: WNL  Sleep:  Good   Screenings:   Assessment and Plan: This patient is a 48 year old male with a history of ADHD and some anxiety when trying to sleep.  He is doing well on his current regimen.  He will continue Adderall 30 mg 3 times daily for ADHD and Valium 10 mg at bedtime as needed for anxiety.  He will return to see me in 3 months   Diannia Rudereborah , MD 03/16/2019, 8:48 AM

## 2019-06-03 ENCOUNTER — Other Ambulatory Visit (HOSPITAL_COMMUNITY): Payer: Self-pay | Admitting: Psychiatry

## 2019-06-16 ENCOUNTER — Encounter (HOSPITAL_COMMUNITY): Payer: Self-pay | Admitting: Psychiatry

## 2019-06-16 ENCOUNTER — Other Ambulatory Visit: Payer: Self-pay

## 2019-06-16 ENCOUNTER — Ambulatory Visit (INDEPENDENT_AMBULATORY_CARE_PROVIDER_SITE_OTHER): Payer: 59 | Admitting: Psychiatry

## 2019-06-16 DIAGNOSIS — F902 Attention-deficit hyperactivity disorder, combined type: Secondary | ICD-10-CM

## 2019-06-16 MED ORDER — AMPHETAMINE-DEXTROAMPHETAMINE 30 MG PO TABS: 30.0000 mg | ORAL_TABLET | Freq: Three times a day (TID) | ORAL | 0 refills | Status: DC

## 2019-06-16 MED ORDER — DIAZEPAM 10 MG PO TABS: ORAL_TABLET | ORAL | 5 refills | Status: DC

## 2019-06-16 NOTE — Progress Notes (Signed)
Virtual Visit via Telephone Note  I connected with Todd Gilbert on 06/16/19 at  9:20 AM EDT by telephone and verified that I am speaking with the correct person using two identifiers.   I discussed the limitations, risks, security and privacy concerns of performing an evaluation and management service by telephone and the availability of in person appointments. I also discussed with the patient that there may be a patient responsible charge related to this service. The patient expressed understanding and agreed to proceed.     I discussed the assessment and treatment plan with the patient. The patient was provided an opportunity to ask questions and all were answered. The patient agreed with the plan and demonstrated an understanding of the instructions.   The patient was advised to call back or seek an in-person evaluation if the symptoms worsen or if the condition fails to improve as anticipated.  I provided 15 minutes of non-face-to-face time during this encounter.   Diannia Rudereborah Ross, MD  [facility] MD/PA/NP OP Progress Note  06/16/2019 9:51 AM Todd Gilbert  MRN:  [id number]  Chief Complaint:  Chief Complaint    Anxiety; ADHD; Follow-up     HPI: This patient is a 48 year old married white male lives with his wife in [place]. He has 3 children who live with his ex-wife in [place]. 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 He was treated with Ritalin. He was "wild" as a teenager and young adult and dropped out of high school. He was in jail numerous times and used to drink and use drugs. Since he's been on Adderall he's been doing much better. He is much less impulsive and he able to stay focused at work. He is eating and sleeping well and doesn't endorse any symptoms of depression or anxiety. At times he is a bit irritable with his family  Patient returns for follow-up after 3 months.  He is continuing to work hard on [employer]  construction projects.  He is hoping to get a more permanent job eventually.  He is staying well focused and has not had any side effects from medication.  He states that he and his wife are getting along well and he does not have any current health problems.  He still needs the Valium to help him get to sleep.  He denies any symptoms of depression or anxiety. Visit Diagnosis:    ICD-10-CM   1. ADHD (attention deficit hyperactivity disorder), combined type  F90.2 amphetamine-dextroamphetamine (ADDERALL) 30 MG tablet    amphetamine-dextroamphetamine (ADDERALL) 30 MG tablet    amphetamine-dextroamphetamine (ADDERALL) 30 MG tablet     Past Psychiatric History: none  Past Medical History:  Past Medical History:  Diagnosis Date  . ADHD (attention deficit hyperactivity disorder)   . Anxiety   . Bipolar disorder (HCC)   . Depression   . Obsessive-compulsive disorder   . Psychosis (HCC)   . Right sciatic nerve pain     Past Surgical History:  Procedure Laterality Date  . HAND TENDON SURGERY  10/13/1996    Family Psychiatric History: see below  Family History:  Family History  Problem Relation Age of Onset  . Alcohol abuse Father   . Anxiety disorder Father   . Dementia Maternal Grandmother   . OCD Neg Hx   . ADD / ADHD Neg Hx   . Bipolar disorder Neg Hx   . Depression Neg Hx   . Drug abuse Neg  Hx   . Paranoid behavior Neg Hx   . Schizophrenia Neg Hx   . Seizures Neg Hx   . Sexual abuse Neg Hx   . Physical abuse Neg Hx     Social History:  Social History   Socioeconomic History  . Marital status: Married    Spouse name: Not on file  . Number of children: Not on file  . Years of education: Not on file  . Highest education level: Not on file  Occupational History  . Not on file  Social Needs  . Financial resource strain: Not on file  . Food insecurity    Worry: Not on file    Inability: Not on file  . Transportation needs    Medical: Not on file    Non-medical: Not  on file  Tobacco Use  . Smoking status: Current Every Day Smoker    Last attempt to quit: 05/13/2012    Years since quitting: 7.0  . Smokeless tobacco: Never Used  . Tobacco comment: quit a year ago  Substance and Sexual Activity  . Alcohol use: No    Alcohol/week: 0.0 standard drinks  . Drug use: No  . Sexual activity: Yes  Lifestyle  . Physical activity    Days per week: Not on file    Minutes per session: Not on file  . Stress: Not on file  Relationships  . Social Musician on phone: Not on file    Gets together: Not on file    Attends religious service: Not on file    Active member of club or organization: Not on file    Attends meetings of clubs or organizations: Not on file    Relationship status: Not on file  Other Topics Concern  . Not on file  Social History Narrative  . Not on file    Allergies:  Allergies  Allergen Reactions  . Lithium Other (See Comments)    Bizarre behavior at night, climbing out the window and bringing mail into the bedroom  . Tegretol [Carbamazepine] Other (See Comments)    Way out there VIVID dreams    Metabolic Disorder Labs: No results found for: HGBA1C, MPG No results found for: PROLACTIN No results found for: CHOL, TRIG, HDL, CHOLHDL, VLDL, LDLCALC No results found for: TSH  Therapeutic Level Labs: No results found for: LITHIUM No results found for: VALPROATE No components found for:  CBMZ  Current Medications: Current Outpatient Medications  Medication Sig Dispense Refill  . amphetamine-dextroamphetamine (ADDERALL) 30 MG tablet Take 1 tablet by mouth 3 (three) times daily. 90 tablet 0  . amphetamine-dextroamphetamine (ADDERALL) 30 MG tablet Take 1 tablet by mouth 3 (three) times daily. 90 tablet 0  . amphetamine-dextroamphetamine (ADDERALL) 30 MG tablet Take 1 tablet by mouth 3 (three) times daily. 90 tablet 0  . diazepam (VALIUM) 10 MG tablet TAKE 1 TABLET BY MOUTH AT BEDTIME AS NEEDED FOR ANXIETY 30 tablet 5  .  losartan (COZAAR) 50 MG tablet Take 50 mg by mouth daily.  2   No current facility-administered medications for this visit.      Musculoskeletal: Strength & Muscle Tone: within normal limits Gait & Station: normal Patient leans: N/A  Psychiatric Specialty Exam: Review of Systems  All other systems reviewed and are negative.   There were no vitals taken for this visit.There is no height or weight on file to calculate BMI.  General Appearance: NA  Eye Contact:  NA  Speech:  Clear  and Coherent  Volume:  Normal  Mood:  Euthymic  Affect:  NA  Thought Process:  Goal Directed  Orientation:  Full (Time, Place, and Person)  Thought Content: WDL   Suicidal Thoughts:  No  Homicidal Thoughts:  No  Memory:  Immediate;   Good Recent;   Good Remote;   Good  Judgement:  Good  Insight:  Fair  Psychomotor Activity:  Normal  Concentration:  Concentration: Good and Attention Span: Good  Recall:  Good  Fund of Knowledge: Fair  Language: Good  Akathisia:  No  Handed:  Right  AIMS (if indicated): not done  Assets:  Communication Skills Desire for Improvement Physical Health Resilience Social Support Talents/Skills  ADL's:  Intact  Cognition: WNL  Sleep:  Good   Screenings:   Assessment and Plan: This patient is a 48 year old male with a history of ADHD and some anxiety when trying to sleep.  He is doing well on his current regimen.  He will continue Adderall 30 mg 3 times daily for ADHD and Valium 10 mg at bedtime as needed for anxiety or sleep.  He will return to see me in 6 months but call around 3 months so we can refill his Adderall.   Levonne Spiller, MD 06/16/2019, 9:51 AM

## 2019-09-01 ENCOUNTER — Telehealth (HOSPITAL_COMMUNITY): Payer: Self-pay | Admitting: *Deleted

## 2019-09-01 ENCOUNTER — Other Ambulatory Visit (HOSPITAL_COMMUNITY): Payer: Self-pay | Admitting: Psychiatry

## 2019-09-01 DIAGNOSIS — F902 Attention-deficit hyperactivity disorder, combined type: Secondary | ICD-10-CM

## 2019-09-01 MED ORDER — AMPHETAMINE-DEXTROAMPHETAMINE 30 MG PO TABS: 30.0000 mg | ORAL_TABLET | Freq: Three times a day (TID) | ORAL | 0 refills | Status: DC

## 2019-09-01 NOTE — Telephone Encounter (Signed)
Adderall reordered for 3 months

## 2019-09-01 NOTE — Telephone Encounter (Signed)
PATIENT CALLED STATED ON HIS 06/16/2019 APPT HE WAS ADVISED PER PROVIDER: He will return to see me in 6 months but call around 3 months so we can refill his Adderall.

## 2019-12-05 ENCOUNTER — Other Ambulatory Visit (HOSPITAL_COMMUNITY): Payer: Self-pay | Admitting: Psychiatry

## 2019-12-13 ENCOUNTER — Encounter (HOSPITAL_COMMUNITY): Payer: Self-pay | Admitting: Psychiatry

## 2019-12-13 ENCOUNTER — Ambulatory Visit (INDEPENDENT_AMBULATORY_CARE_PROVIDER_SITE_OTHER): Payer: 59 | Admitting: Psychiatry

## 2019-12-13 ENCOUNTER — Other Ambulatory Visit: Payer: Self-pay

## 2019-12-13 DIAGNOSIS — F902 Attention-deficit hyperactivity disorder, combined type: Secondary | ICD-10-CM

## 2019-12-13 MED ORDER — DIAZEPAM 10 MG PO TABS: 10.0000 mg | ORAL_TABLET | Freq: Every day | ORAL | 5 refills | Status: DC

## 2019-12-13 MED ORDER — AMPHETAMINE-DEXTROAMPHETAMINE 30 MG PO TABS: 30.0000 mg | ORAL_TABLET | Freq: Three times a day (TID) | ORAL | 0 refills | Status: DC

## 2019-12-13 NOTE — Progress Notes (Signed)
Virtual Visit via Telephone Note  I connected with Todd Gilbert on 12/13/19 at  8:40 AM EST by telephone and verified that I am speaking with the correct person using two identifiers.   I discussed the limitations, risks, security and privacy concerns of performing an evaluation and management service by telephone and the availability of in person appointments. I also discussed with the patient that there may be a patient responsible charge related to this service. The patient expressed understanding and agreed to proceed.    I discussed the assessment and treatment plan with the patient. The patient was provided an opportunity to ask questions and all were answered. The patient agreed with the plan and demonstrated an understanding of the instructions.   The patient was advised to call back or seek an in-person evaluation if the symptoms worsen or if the condition fails to improve as anticipated.  I provided 15 minutes of non-face-to-face time during this encounter.   [name], MD  [facility] MD/PA/NP OP Progress Note  12/13/2019 8:55 AM Todd Gilbert  MRN:  [id number]  Chief Complaint:  Chief Complaint    ADHD; Follow-up     HPI: This patient is a 49 year old married white male lives with his wife in [place]. He has 3 children who live with his ex-wife in [place]. 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 He was treated with Ritalin. He was "wild" as a teenager and young adult and dropped out of high school. He was in jail numerous times and used to drink and use drugs. Since he's been on Adderall he's been doing much better. He is much less impulsive and he able to stay focused at work. He is eating and sleeping well and doesn't endorse any symptoms of depression or anxiety. At times he is a bit irritable with his family  The patient returns after 6 months.  He states he is doing well.  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  He states that this will provide him job security for another couple of years.  He states that he is focusing well although sometimes he takes 1 extra Adderall.  Given that he is on 90 mg a day I warned him that this is too much.  He states that he checks his blood pressure and it has been normal.  He is sleeping well with the Valium and getting along well with family members.  He denies any symptoms of depression or anxiety Visit Diagnosis:    ICD-10-CM   1. ADHD (attention deficit hyperactivity disorder), combined type  F90.2 amphetamine-dextroamphetamine (ADDERALL) 30 MG tablet    amphetamine-dextroamphetamine (ADDERALL) 30 MG tablet    amphetamine-dextroamphetamine (ADDERALL) 30 MG tablet    Past Psychiatric History: none  Past Medical History:  Past Medical History:  Diagnosis Date  . ADHD (attention deficit hyperactivity disorder)   . Anxiety   . Bipolar disorder (HCC)   . Depression   . Obsessive-compulsive disorder   . Psychosis (HCC)   . Right sciatic nerve pain     Past Surgical History:  Procedure Laterality Date  . HAND TENDON SURGERY  10/13/1996    Family Psychiatric History: see below  Family History:  Family History  Problem Relation Age of Onset  . Alcohol abuse Father   . Anxiety disorder Father   . Dementia Maternal Grandmother   .  OCD Neg Hx   . ADD / ADHD Neg Hx   . Bipolar disorder Neg Hx   . Depression Neg Hx   . Drug abuse Neg Hx   . Paranoid behavior Neg Hx   . Schizophrenia Neg Hx   . Seizures Neg Hx   . Sexual abuse Neg Hx   . Physical abuse Neg Hx     Social History:  Social History   Socioeconomic History  . Marital status: Married    Spouse name: Not on file  . Number of children: Not on file  . Years of education: Not on file  . Highest education level: Not on file  Occupational History  . Not on file  Tobacco Use  . Smoking status: Current Every Day  Smoker    Last attempt to quit: 05/13/2012    Years since quitting: 7.5  . Smokeless tobacco: Never Used  . Tobacco comment: quit a year ago  Substance and Sexual Activity  . Alcohol use: No    Alcohol/week: 0.0 standard drinks  . Drug use: No  . Sexual activity: Yes  Other Topics Concern  . Not on file  Social History Narrative  . Not on file   Social Determinants of Health   Financial Resource Strain:   . Difficulty of Paying Living Expenses: Not on file  Food Insecurity:   . Worried About Charity fundraiser in the Last Year: Not on file  . Ran Out of Food in the Last Year: Not on file  Transportation Needs:   . Lack of Transportation (Medical): Not on file  . Lack of Transportation (Non-Medical): Not on file  Physical Activity:   . Days of Exercise per Week: Not on file  . Minutes of Exercise per Session: Not on file  Stress:   . Feeling of Stress : Not on file  Social Connections:   . Frequency of Communication with Friends and Family: Not on file  . Frequency of Social Gatherings with Friends and Family: Not on file  . Attends Religious Services: Not on file  . Active Member of Clubs or Organizations: Not on file  . Attends Archivist Meetings: Not on file  . Marital Status: Not on file    Allergies:  Allergies  Allergen Reactions  . Lithium Other (See Comments)    Bizarre behavior at night, climbing out the window and bringing mail into the bedroom  . Tegretol [Carbamazepine] Other (See Comments)    Way out there VIVID dreams    Metabolic Disorder Labs: No results found for: HGBA1C, MPG No results found for: PROLACTIN No results found for: CHOL, TRIG, HDL, CHOLHDL, VLDL, LDLCALC No results found for: TSH  Therapeutic Level Labs: No results found for: LITHIUM No results found for: VALPROATE No components found for:  CBMZ  Current Medications: Current Outpatient Medications  Medication Sig Dispense Refill  . amphetamine-dextroamphetamine  (ADDERALL) 30 MG tablet Take 1 tablet by mouth 3 (three) times daily. 90 tablet 0  . amphetamine-dextroamphetamine (ADDERALL) 30 MG tablet Take 1 tablet by mouth 3 (three) times daily. 90 tablet 0  . amphetamine-dextroamphetamine (ADDERALL) 30 MG tablet Take 1 tablet by mouth 3 (three) times daily. 90 tablet 0  . diazepam (VALIUM) 10 MG tablet Take 1 tablet (10 mg total) by mouth at bedtime. 30 tablet 5  . losartan (COZAAR) 50 MG tablet Take 50 mg by mouth daily.  2   No current facility-administered medications for this visit.  Musculoskeletal: Strength & Muscle Tone: within normal limits Gait & Station: normal Patient leans: N/A  Psychiatric Specialty Exam: Review of Systems  All other systems reviewed and are negative.   There were no vitals taken for this visit.There is no height or weight on file to calculate BMI.  General Appearance: NA  Eye Contact:  NA  Speech:  Clear and Coherent  Volume:  Normal  Mood:  Euthymic  Affect:  Appropriate and Congruent  Thought Process:  Goal Directed  Orientation:  Full (Time, Place, and Person)  Thought Content: WDL   Suicidal Thoughts:  No  Homicidal Thoughts:  No  Memory:  Immediate;   Good Recent;   Good Remote;   Fair  Judgement:  Good  Insight:  Fair  Psychomotor Activity:  Normal  Concentration:  Concentration: Good and Attention Span: Good  Recall:  Good  Fund of Knowledge: Good  Language: Good  Akathisia:  No  Handed:  Right  AIMS (if indicated): not done  Assets:  Communication Skills Desire for Improvement Physical Health Resilience Social Support Talents/Skills  ADL's:  Intact  Cognition: WNL  Sleep:  Good   Screenings:   Assessment and Plan: This patient is a 49 year old male with a history of ADHD and anxiety particularly at bedtime.  He continues to do well on his current regimen.  He will continue Adderall 30 mg 3 times daily for ADHD and Valium 10 mg at bedtime as needed for sleep or anxiety.  He will  return to see me in 6 months but call around 3 months we can refill his Adderall.  He was warned not to take extra pills per day.   [name], MD 12/13/2019, 8:55 AM

## 2020-02-17 ENCOUNTER — Telehealth (HOSPITAL_COMMUNITY): Payer: Self-pay

## 2020-02-17 NOTE — Telephone Encounter (Signed)
Medication management - message left for patient that a new prior authorization request had been faxed into [employer] today for his Adderall 30 mg, 3 a day on today.  Requested he call back if any questions and informed if prior authorization goes through without problems he should be able to refill his Adderall within the next few days and possibly today.

## 2020-02-22 ENCOUNTER — Telehealth (HOSPITAL_COMMUNITY): Payer: Self-pay

## 2020-02-22 NOTE — Telephone Encounter (Signed)
APPROVED FOR 3 TABLETS PER DAY

## 2020-02-22 NOTE — Telephone Encounter (Signed)
[employer] PRES[employer] COVERAGE APPROVED  AMPHETAMINE-DEXTROAMPHETAMINE 30MG  TABLET REFERENCE# [id number] EFFECTIVE 02/21/2020 TO 02/19/2021

## 2020-03-03 ENCOUNTER — Other Ambulatory Visit (HOSPITAL_COMMUNITY): Payer: Self-pay | Admitting: Psychiatry

## 2020-03-03 DIAGNOSIS — F902 Attention-deficit hyperactivity disorder, combined type: Secondary | ICD-10-CM

## 2020-04-12 ENCOUNTER — Other Ambulatory Visit (HOSPITAL_COMMUNITY): Payer: Self-pay | Admitting: Psychiatry

## 2020-04-12 DIAGNOSIS — F902 Attention-deficit hyperactivity disorder, combined type: Secondary | ICD-10-CM

## 2020-04-12 MED ORDER — AMPHETAMINE-DEXTROAMPHETAMINE 30 MG PO TABS: 30.0000 mg | ORAL_TABLET | Freq: Three times a day (TID) | ORAL | 0 refills | Status: DC

## 2020-04-12 MED ORDER — AMPHETAMINE-DEXTROAMPHETAMINE 30 MG PO TABS: 1.0000 | ORAL_TABLET | Freq: Three times a day (TID) | ORAL | 0 refills | Status: DC

## 2020-06-01 ENCOUNTER — Other Ambulatory Visit (HOSPITAL_COMMUNITY): Payer: Self-pay | Admitting: Psychiatry

## 2020-06-04 ENCOUNTER — Other Ambulatory Visit: Payer: Self-pay

## 2020-06-04 ENCOUNTER — Telehealth (INDEPENDENT_AMBULATORY_CARE_PROVIDER_SITE_OTHER): Payer: 59 | Admitting: Psychiatry

## 2020-06-04 ENCOUNTER — Encounter (HOSPITAL_COMMUNITY): Payer: Self-pay | Admitting: Psychiatry

## 2020-06-04 DIAGNOSIS — F902 Attention-deficit hyperactivity disorder, combined type: Secondary | ICD-10-CM | POA: Diagnosis not present

## 2020-06-04 MED ORDER — DIAZEPAM 10 MG PO TABS: 10.0000 mg | ORAL_TABLET | Freq: Every day | ORAL | 5 refills | Status: DC

## 2020-06-04 MED ORDER — AMPHETAMINE-DEXTROAMPHETAMINE 30 MG PO TABS: 30.0000 mg | ORAL_TABLET | Freq: Three times a day (TID) | ORAL | 0 refills | Status: DC

## 2020-06-04 MED ORDER — AMPHETAMINE-DEXTROAMPHETAMINE 30 MG PO TABS: 1.0000 | ORAL_TABLET | Freq: Three times a day (TID) | ORAL | 0 refills | Status: DC

## 2020-06-04 NOTE — Progress Notes (Signed)
Virtual Visit via Telephone Note  I connected with Todd Gilbert on 06/04/20 at  9:00 AM EDT by telephone and verified that I am speaking with the correct person using two identifiers.   I discussed the limitations, risks, security and privacy concerns of performing an evaluation and management service by telephone and the availability of in person appointments. I also discussed with the patient that there may be a patient responsible charge related to this service. The patient expressed understanding and agreed to proceed   I discussed the assessment and treatment plan with the patient. The patient was provided an opportunity to ask questions and all were answered. The patient agreed with the plan and demonstrated an understanding of the instructions.   The patient was advised to call back or seek an in-person evaluation if the symptoms worsen or if the condition fails to improve as anticipated.  I provided 15 minutes of non-face-to-face time during this encounter. Location: Provider office, patient home  [name], MD  [facility] MD/PA/NP OP Progress Note  06/04/2020 9:09 AM Todd Gilbert  MRN:  [id number]  Chief Complaint:  Chief Complaint    ADD; Follow-up     HPI: This patient is a 49 year old married white male lives with his wife in [place]. He has 3 children who live with his ex-wife in [place]. 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 He was treated with Ritalin. He was "wild" as a teenager and young adult and dropped out of high school. He was in jail numerous times and used to drink and use drugs. Since he's been on Adderall he's been doing much better. He is much less impulsive and he able to stay focused at work. He is eating and sleeping well and doesn't endorse any symptoms of depression or anxiety. At times he is a bit irritable with his family  The patient returns after 6 months.  He states that he is  generally doing well.  His health has been good and he states his blood pressure has been normal.  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  He states that his mood is good.  He still needs the Valium in order to sleep well because sometimes his back hurts.  He denies depression or suicidal ideation.  The Adderall continues to help with his focus.  He states at times he takes an extra 1 and I warned him not to do this because of the high dosage and the effects it may have on blood pressure and cardiac status. Visit Diagnosis:    ICD-10-CM   1. ADHD (attention deficit hyperactivity disorder), combined type  F90.2 amphetamine-dextroamphetamine (ADDERALL) 30 MG tablet    amphetamine-dextroamphetamine (ADDERALL) 30 MG tablet    amphetamine-dextroamphetamine (ADDERALL) 30 MG tablet    Past Psychiatric History: none  Past Medical History:  Past Medical History:  Diagnosis Date  . ADHD (attention deficit hyperactivity disorder)   . Anxiety   . Bipolar disorder (HCC)   . Depression   . Obsessive-compulsive disorder   . Psychosis (HCC)   . Right sciatic nerve pain     Past Surgical History:  Procedure Laterality Date  . HAND TENDON SURGERY  10/13/1996    Family Psychiatric History: see below  Family History:  Family History  Problem Relation Age of Onset  . Alcohol abuse Father   . Anxiety disorder Father   . Dementia Maternal Grandmother   .  OCD Neg Hx   . ADD / ADHD Neg Hx   . Bipolar disorder Neg Hx   . Depression Neg Hx   . Drug abuse Neg Hx   . Paranoid behavior Neg Hx   . Schizophrenia Neg Hx   . Seizures Neg Hx   . Sexual abuse Neg Hx   . Physical abuse Neg Hx     Social History:  Social History   Socioeconomic History  . Marital status: Married    Spouse name: Not on file  . Number of children: Not on file  . Years of education: Not on file  . Highest education level: Not on file  Occupational History  . Not on file  Tobacco Use  .  Smoking status: Current Every Day Smoker    Last attempt to quit: 05/13/2012    Years since quitting: 8.0  . Smokeless tobacco: Never Used  . Tobacco comment: quit a year ago  Substance and Sexual Activity  . Alcohol use: No    Alcohol/week: 0.0 standard drinks  . Drug use: No  . Sexual activity: Yes  Other Topics Concern  . Not on file  Social History Narrative  . Not on file   Social Determinants of Health   Financial Resource Strain:   . Difficulty of Paying Living Expenses: Not on file  Food Insecurity:   . Worried About Programme researcher, broadcasting/film/video in the Last Year: Not on file  . Ran Out of Food in the Last Year: Not on file  Transportation Needs:   . Lack of Transportation (Medical): Not on file  . Lack of Transportation (Non-Medical): Not on file  Physical Activity:   . Days of Exercise per Week: Not on file  . Minutes of Exercise per Session: Not on file  Stress:   . Feeling of Stress : Not on file  Social Connections:   . Frequency of Communication with Friends and Family: Not on file  . Frequency of Social Gatherings with Friends and Family: Not on file  . Attends Religious Services: Not on file  . Active Member of Clubs or Organizations: Not on file  . Attends Banker Meetings: Not on file  . Marital Status: Not on file    Allergies:  Allergies  Allergen Reactions  . Lithium Other (See Comments)    Bizarre behavior at night, climbing out the window and bringing mail into the bedroom  . Tegretol [Carbamazepine] Other (See Comments)    Way out there VIVID dreams    Metabolic Disorder Labs: No results found for: HGBA1C, MPG No results found for: PROLACTIN No results found for: CHOL, TRIG, HDL, CHOLHDL, VLDL, LDLCALC No results found for: TSH  Therapeutic Level Labs: No results found for: LITHIUM No results found for: VALPROATE No components found for:  CBMZ  Current Medications: Current Outpatient Medications  Medication Sig Dispense Refill  .  amphetamine-dextroamphetamine (ADDERALL) 30 MG tablet Take 1 tablet by mouth 3 (three) times daily. 90 tablet 0  . amphetamine-dextroamphetamine (ADDERALL) 30 MG tablet Take 1 tablet by mouth 3 (three) times daily. 90 tablet 0  . amphetamine-dextroamphetamine (ADDERALL) 30 MG tablet Take 1 tablet by mouth 3 (three) times daily. 90 tablet 0  . diazepam (VALIUM) 10 MG tablet Take 1 tablet (10 mg total) by mouth at bedtime. 30 tablet 5  . losartan (COZAAR) 50 MG tablet Take 50 mg by mouth daily.  2   No current facility-administered medications for this visit.  Musculoskeletal: Strength & Muscle Tone: within normal limits Gait & Station: normal Patient leans: N/A  Psychiatric Specialty Exam: Review of Systems  All other systems reviewed and are negative.   There were no vitals taken for this visit.There is no height or weight on file to calculate BMI.  General Appearance: NA  Eye Contact:  NA  Speech:  Clear and Coherent  Volume:  Normal  Mood:  Euthymic  Affect:  Appropriate and Congruent  Thought Process:  Goal Directed  Orientation:  Full (Time, Place, and Person)  Thought Content: WDL   Suicidal Thoughts:  No  Homicidal Thoughts:  No  Memory:  Immediate;   Good Recent;   Good Remote;   Fair  Judgement:  Good  Insight:  Fair  Psychomotor Activity:  Normal  Concentration:  Concentration: Good and Attention Span: Good  Recall:  Good  Fund of Knowledge: Good  Language: Good  Akathisia:  No  Handed:  Right  AIMS (if indicated): not done  Assets:  Communication Skills Desire for Improvement Physical Health Resilience Social Support Talents/Skills  ADL's:  Intact  Cognition: WNL  Sleep:  Good   Screenings:   Assessment and Plan: This patient is a 49 year old male with a history of ADHD and anxiety.  He continues to do well on his current regimen.  He will continue Adderall 30 mg 3 times daily for ADHD and Valium 10 mg at bedtime as needed for sleep and anxiety.   He will return to see me in 6 months or call around 3 months so we can refill his Adderall.  He was again told not to take extra Adderall.   [name], MD 06/04/2020, 9:09 AM

## 2020-06-21 ENCOUNTER — Ambulatory Visit (HOSPITAL_COMMUNITY): Payer: 59 | Admitting: Psychiatry

## 2020-09-03 ENCOUNTER — Other Ambulatory Visit (HOSPITAL_COMMUNITY): Payer: Self-pay | Admitting: Psychiatry

## 2020-09-03 DIAGNOSIS — F902 Attention-deficit hyperactivity disorder, combined type: Secondary | ICD-10-CM

## 2020-09-20 ENCOUNTER — Encounter (HOSPITAL_COMMUNITY): Payer: Self-pay | Admitting: Psychiatry

## 2020-09-20 ENCOUNTER — Other Ambulatory Visit: Payer: Self-pay

## 2020-09-20 ENCOUNTER — Telehealth (INDEPENDENT_AMBULATORY_CARE_PROVIDER_SITE_OTHER): Payer: Self-pay | Admitting: Psychiatry

## 2020-09-20 DIAGNOSIS — F902 Attention-deficit hyperactivity disorder, combined type: Secondary | ICD-10-CM

## 2020-09-20 MED ORDER — AMPHETAMINE-DEXTROAMPHETAMINE 30 MG PO TABS: ORAL_TABLET | ORAL | 0 refills | Status: DC

## 2020-09-20 MED ORDER — AMPHETAMINE-DEXTROAMPHETAMINE 30 MG PO TABS: 1.0000 | ORAL_TABLET | Freq: Three times a day (TID) | ORAL | 0 refills | Status: DC

## 2020-09-20 MED ORDER — AMPHETAMINE-DEXTROAMPHETAMINE 30 MG PO TABS: 30.0000 mg | ORAL_TABLET | Freq: Three times a day (TID) | ORAL | 0 refills | Status: DC

## 2020-09-20 MED ORDER — DIAZEPAM 10 MG PO TABS: 10.0000 mg | ORAL_TABLET | Freq: Every day | ORAL | 5 refills | Status: DC

## 2020-09-20 NOTE — Progress Notes (Signed)
Virtual Visit via Telephone Note  I connected with Todd Gilbert on 09/20/20 at  9:20 AM EST by telephone and verified that I am speaking with the correct person using two identifiers.  Location: Patient: home Provider: home   I discussed the limitations, risks, security and privacy concerns of performing an evaluation and management service by telephone and the availability of in person appointments. I also discussed with the patient that there may be a patient responsible charge related to this service. The patient expressed understanding and agreed to proceed.    I discussed the assessment and treatment plan with the patient. The patient was provided an opportunity to ask questions and all were answered. The patient agreed with the plan and demonstrated an understanding of the instructions.   The patient was advised to call back or seek an in-person evaluation if the symptoms worsen or if the condition fails to improve as anticipated.  I provided 15 minutes of non-face-to-face time during this encounter.   [name], MD  [facility] MD/PA/NP OP Progress Note  09/20/2020 9:37 AM Todd Gilbert  MRN:  [id number]  Chief Complaint:  Chief Complaint    ADD; Follow-up     HPI: This patient is a 49 year old married white male lives with his wife in [place]. He has 3 children who live with his ex-wife in [place]. 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 He was treated with Ritalin. He was "wild" as a teenager and young adult and dropped out of high school. He was in jail numerous times and used to drink and use drugs. Since he's been on Adderall he's been doing much better. He is much less impulsive and he able to stay focused at work. He is eating and sleeping well and doesn't endorse any symptoms of depression or anxiety. At times he is a bit irritable with his family  The patient returns for follow-up after 3 months.  He  states that he is looking for full-time employment now.  His wife recently quit her job.  He states that his wife quit because she was required to get the Covid shot and she did not want to.  However he is contemplating getting his and I strongly urged him to do so.  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  The Adderall he has is currently working well for his ADD.  The Valium continues to help his anxiety.  He does not have any other complaints. Visit Diagnosis:    ICD-10-CM   1. ADHD (attention deficit hyperactivity disorder), combined type  F90.2 amphetamine-dextroamphetamine (ADDERALL) 30 MG tablet    amphetamine-dextroamphetamine (ADDERALL) 30 MG tablet    amphetamine-dextroamphetamine (ADDERALL) 30 MG tablet    Past Psychiatric History: none  Past Medical History:  Past Medical History:  Diagnosis Date  . ADHD (attention deficit hyperactivity disorder)   . Anxiety   . Bipolar disorder (HCC)   . Depression   . Obsessive-compulsive disorder   . Psychosis (HCC)   . Right sciatic nerve pain     Past Surgical History:  Procedure Laterality Date  . HAND TENDON SURGERY  10/13/1996    Family Psychiatric History: see below  Family History:  Family History  Problem Relation Age of Onset  . Alcohol abuse Father   . Anxiety disorder Father   . Dementia Maternal Grandmother   . OCD Neg Hx   . ADD / ADHD Neg Hx   .  Bipolar disorder Neg Hx   . Depression Neg Hx   . Drug abuse Neg Hx   . Paranoid behavior Neg Hx   . Schizophrenia Neg Hx   . Seizures Neg Hx   . Sexual abuse Neg Hx   . Physical abuse Neg Hx     Social History:  Social History   Socioeconomic History  . Marital status: Married    Spouse name: Not on file  . Number of children: Not on file  . Years of education: Not on file  . Highest education level: Not on file  Occupational History  . Not on file  Tobacco Use  . Smoking status: Current Every Day Smoker    Last attempt to quit: 05/13/2012    Years  since quitting: 8.3  . Smokeless tobacco: Never Used  . Tobacco comment: quit a year ago  Substance and Sexual Activity  . Alcohol use: No    Alcohol/week: 0.0 standard drinks  . Drug use: No  . Sexual activity: Yes  Other Topics Concern  . Not on file  Social History Narrative  . Not on file   Social Determinants of Health   Financial Resource Strain: Not on file  Food Insecurity: Not on file  Transportation Needs: Not on file  Physical Activity: Not on file  Stress: Not on file  Social Connections: Not on file    Allergies:  Allergies  Allergen Reactions  . Lithium Other (See Comments)    Bizarre behavior at night, climbing out the window and bringing mail into the bedroom  . Tegretol [Carbamazepine] Other (See Comments)    Way out there VIVID dreams    Metabolic Disorder Labs: No results found for: HGBA1C, MPG No results found for: PROLACTIN No results found for: CHOL, TRIG, HDL, CHOLHDL, VLDL, LDLCALC No results found for: TSH  Therapeutic Level Labs: No results found for: LITHIUM No results found for: VALPROATE No components found for:  CBMZ  Current Medications: Current Outpatient Medications  Medication Sig Dispense Refill  . amphetamine-dextroamphetamine (ADDERALL) 30 MG tablet Take 1 tablet by mouth 3 (three) times daily. 90 tablet 0  . amphetamine-dextroamphetamine (ADDERALL) 30 MG tablet Take 1 tablet by mouth 3 (three) times daily. 90 tablet 0  . amphetamine-dextroamphetamine (ADDERALL) 30 MG tablet TAKE 1 TABLET BY MOUTH THREE TIMES DAILY -- 90 tablet 0  . diazepam (VALIUM) 10 MG tablet Take 1 tablet (10 mg total) by mouth at bedtime. 30 tablet 5  . losartan (COZAAR) 50 MG tablet Take 50 mg by mouth daily.  2   No current facility-administered medications for this visit.     Musculoskeletal: Strength & Muscle Tone: within normal limits Gait & Station: normal Patient leans: N/A  Psychiatric Specialty Exam: Review of Systems  All other systems  reviewed and are negative.   There were no vitals taken for this visit.There is no height or weight on file to calculate BMI.  General Appearance: NA  Eye Contact:  NA  Speech:  Clear and Coherent  Volume:  Normal  Mood:  Euthymic  Affect:  NA  Thought Process:  Goal Directed  Orientation:  Full (Time, Place, and Person)  Thought Content: WDL   Suicidal Thoughts:  No  Homicidal Thoughts:  No  Memory:  Immediate;   Good Recent;   Good Remote;   Fair  Judgement:  Good  Insight:  Fair  Psychomotor Activity:  Normal  Concentration:  Concentration: Good and Attention Span: Good  Recall:  Good  Fund of Knowledge: Good  Language: Good  Akathisia:  No  Handed:  Right  AIMS (if indicated): not done  Assets:  Communication Skills Desire for Improvement Physical Health Resilience Social Support Talents/Skills  ADL's:  Intact  Cognition: WNL  Sleep:  Good   Screenings:   Assessment and Plan: This patient is a 49 year old male with a history of ADHD and anxiety.  He continues to do well on his current regimen.  He will continue Adderall 30 mg 3 times daily for ADHD and Valium 10 mg at bedtime as needed for sleep and anxiety.  He will return to see me in 6 months or call around 3 months so we can refill his Adderall.     [name], MD 09/20/2020, 9:37 AM

## 2021-01-01 ENCOUNTER — Telehealth (HOSPITAL_COMMUNITY): Payer: Self-pay

## 2021-01-01 ENCOUNTER — Other Ambulatory Visit (HOSPITAL_COMMUNITY): Payer: Self-pay | Admitting: Psychiatry

## 2021-01-01 DIAGNOSIS — F902 Attention-deficit hyperactivity disorder, combined type: Secondary | ICD-10-CM

## 2021-01-01 MED ORDER — DIAZEPAM 10 MG PO TABS: 10.0000 mg | ORAL_TABLET | Freq: Every day | ORAL | 5 refills | Status: DC

## 2021-01-01 MED ORDER — AMPHETAMINE-DEXTROAMPHETAMINE 30 MG PO TABS
30.0000 mg | ORAL_TABLET | Freq: Three times a day (TID) | ORAL | 0 refills | Status: DC
Start: 2021-01-01 — End: 2021-03-18

## 2021-01-01 MED ORDER — AMPHETAMINE-DEXTROAMPHETAMINE 30 MG PO TABS: 1.0000 | ORAL_TABLET | Freq: Three times a day (TID) | ORAL | 0 refills | Status: DC

## 2021-01-01 MED ORDER — AMPHETAMINE-DEXTROAMPHETAMINE 30 MG PO TABS: ORAL_TABLET | ORAL | 0 refills | Status: DC

## 2021-01-01 NOTE — Telephone Encounter (Signed)
sent 

## 2021-01-01 NOTE — Telephone Encounter (Signed)
Medication management - Telephone call with pt after he left a message requesting refills and next appt.  Informed pt he is already scheduled next on 03/18/21 and would send request for refills to Dr. Tenny Craw to be sent to [facility].

## 2021-01-31 ENCOUNTER — Telehealth (HOSPITAL_COMMUNITY): Payer: Self-pay | Admitting: *Deleted

## 2021-01-31 NOTE — Telephone Encounter (Signed)
Called patient due to prior auth and noticed that patient do not have insurance on file. Staff was not able to reach patient Todd Gilbert asking patient to please call office with Insurance information and office needs a hard copy of the insurance card

## 2021-03-18 ENCOUNTER — Encounter (HOSPITAL_COMMUNITY): Payer: Self-pay | Admitting: Psychiatry

## 2021-03-18 ENCOUNTER — Telehealth (INDEPENDENT_AMBULATORY_CARE_PROVIDER_SITE_OTHER): Payer: Self-pay | Admitting: Psychiatry

## 2021-03-18 ENCOUNTER — Other Ambulatory Visit: Payer: Self-pay

## 2021-03-18 DIAGNOSIS — F902 Attention-deficit hyperactivity disorder, combined type: Secondary | ICD-10-CM

## 2021-03-18 MED ORDER — AMPHETAMINE-DEXTROAMPHETAMINE 30 MG PO TABS
ORAL_TABLET | ORAL | 0 refills | Status: DC
Start: 2021-03-18 — End: 2021-07-03

## 2021-03-18 MED ORDER — AMPHETAMINE-DEXTROAMPHETAMINE 30 MG PO TABS: 1.0000 | ORAL_TABLET | Freq: Three times a day (TID) | ORAL | 0 refills | Status: DC

## 2021-03-18 MED ORDER — AMPHETAMINE-DEXTROAMPHETAMINE 30 MG PO TABS
30.0000 mg | ORAL_TABLET | Freq: Three times a day (TID) | ORAL | 0 refills | Status: DC
Start: 2021-03-18 — End: 2021-07-03

## 2021-03-18 MED ORDER — DIAZEPAM 10 MG PO TABS: 10.0000 mg | ORAL_TABLET | Freq: Every day | ORAL | 5 refills | Status: DC

## 2021-03-18 NOTE — Progress Notes (Signed)
Virtual Visit via Telephone Note  I connected with Todd Gilbert on 03/18/21 at  8:40 AM EDT by telephone and verified that I am speaking with the correct person using two identifiers.  Location: Patient: home Provider: home office   I discussed the limitations, risks, security and privacy concerns of performing an evaluation and management service by telephone and the availability of in person appointments. I also discussed with the patient that there may be a patient responsible charge related to this service. The patient expressed understanding and agreed to proceed.     I discussed the assessment and treatment plan with the patient. The patient was provided an opportunity to ask questions and all were answered. The patient agreed with the plan and demonstrated an understanding of the instructions.   The patient was advised to call back or seek an in-person evaluation if the symptoms worsen or if the condition fails to improve as anticipated.  I provided 15 minutes of non-face-to-face time during this encounter.   [name], MD  [facility] MD/PA/NP OP Progress Note  03/18/2021 8:57 AM Todd Gilbert  MRN:  [id number]  Chief Complaint:  Chief Complaint    Anxiety; ADHD; Follow-up     HPI: This patient is a 50 year old married white male lives with his wife in [place]. He has 3 children who live with his ex-wife in [place]. 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 He was treated with Ritalin. He was "wild" as a teenager and young adult and dropped out of high school. He was in jail numerous times and used to drink and use drugs. Since he's been on Adderall he's been doing much better. He is much less impulsive and he able to stay focused at work. He is eating and sleeping well and doesn't endorse any symptoms of depression or anxiety. At times he is a bit irritable with his family  The patient returns for follow-up after  6 months.  He states that he is doing well.  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  He is getting a lot of hours.  He is currently there temporarily but hopes to get hired on permanently.  He is really enjoying the job.  His focus is good and he denies any difficulty with anxiety or sleep.  His mood has been "great."  He denies any new health concerns Visit Diagnosis:    ICD-10-CM   1. ADHD (attention deficit hyperactivity disorder), combined type  F90.2 amphetamine-dextroamphetamine (ADDERALL) 30 MG tablet    amphetamine-dextroamphetamine (ADDERALL) 30 MG tablet    amphetamine-dextroamphetamine (ADDERALL) 30 MG tablet    Past Psychiatric History: none  Past Medical History:  Past Medical History:  Diagnosis Date  . ADHD (attention deficit hyperactivity disorder)   . Anxiety   . Bipolar disorder (HCC)   . Depression   . Obsessive-compulsive disorder   . Psychosis (HCC)   . Right sciatic nerve pain     Past Surgical History:  Procedure Laterality Date  . HAND TENDON SURGERY  10/13/1996    Family Psychiatric History: see below  Family History:  Family History  Problem Relation Age of Onset  . Alcohol abuse Father   . Anxiety disorder Father   . Dementia Maternal Grandmother   . OCD Neg Hx   . ADD / ADHD Neg Hx   . Bipolar disorder Neg Hx   . Depression Neg  Hx   . Drug abuse Neg Hx   . Paranoid behavior Neg Hx   . Schizophrenia Neg Hx   . Seizures Neg Hx   . Sexual abuse Neg Hx   . Physical abuse Neg Hx     Social History:  Social History   Socioeconomic History  . Marital status: Married    Spouse name: Not on file  . Number of children: Not on file  . Years of education: Not on file  . Highest education level: Not on file  Occupational History  . Not on file  Tobacco Use  . Smoking status: Current Every Day Smoker    Last attempt to quit: 05/13/2012    Years since quitting: 8.8  . Smokeless tobacco: Never Used   . Tobacco comment: quit a year ago  Substance and Sexual Activity  . Alcohol use: No    Alcohol/week: 0.0 standard drinks  . Drug use: No  . Sexual activity: Yes  Other Topics Concern  . Not on file  Social History Narrative  . Not on file   Social Determinants of Health   Financial Resource Strain: Not on file  Food Insecurity: Not on file  Transportation Needs: Not on file  Physical Activity: Not on file  Stress: Not on file  Social Connections: Not on file    Allergies:  Allergies  Allergen Reactions  . Lithium Other (See Comments)    Bizarre behavior at night, climbing out the window and bringing mail into the bedroom  . Tegretol [Carbamazepine] Other (See Comments)    Way out there VIVID dreams    Metabolic Disorder Labs: No results found for: HGBA1C, MPG No results found for: PROLACTIN No results found for: CHOL, TRIG, HDL, CHOLHDL, VLDL, LDLCALC No results found for: TSH  Therapeutic Level Labs: No results found for: LITHIUM No results found for: VALPROATE No components found for:  CBMZ  Current Medications: Current Outpatient Medications  Medication Sig Dispense Refill  . amphetamine-dextroamphetamine (ADDERALL) 30 MG tablet Take 1 tablet by mouth 3 (three) times daily. 90 tablet 0  . amphetamine-dextroamphetamine (ADDERALL) 30 MG tablet Take 1 tablet by mouth 3 (three) times daily. 90 tablet 0  . amphetamine-dextroamphetamine (ADDERALL) 30 MG tablet TAKE 1 TABLET BY MOUTH THREE TIMES DAILY -- 90 tablet 0  . diazepam (VALIUM) 10 MG tablet Take 1 tablet (10 mg total) by mouth at bedtime. 30 tablet 5  . losartan (COZAAR) 50 MG tablet Take 50 mg by mouth daily.  2   No current facility-administered medications for this visit.     Musculoskeletal: Strength & Muscle Tone: within normal limits Gait & Station: normal Patient leans: N/A  Psychiatric Specialty Exam: Review of Systems  All other systems reviewed and are negative.   There were no vitals  taken for this visit.There is no height or weight on file to calculate BMI.  General Appearance: NA  Eye Contact:  NA  Speech:  Clear and Coherent  Volume:  Normal  Mood:  Euthymic  Affect:  NA  Thought Process:  Goal Directed  Orientation:  Full (Time, Place, and Person)  Thought Content: WDL   Suicidal Thoughts:  No  Homicidal Thoughts:  No  Memory:  Immediate;   Good Recent;   Good Remote;   Good  Judgement:  Good  Insight:  Fair  Psychomotor Activity:  Normal  Concentration:  Concentration: Good and Attention Span: Good  Recall:  Good  Fund of Knowledge: Good  Language: Good  Akathisia:  No  Handed:  Right  AIMS (if indicated): not done  Assets:  Communication Skills Desire for Improvement Physical Health Resilience Social Support Talents/Skills  ADL's:  Intact  Cognition: WNL  Sleep:  Good   Screenings:   Assessment and Plan: This patient is a 50 year old male with a history of ADHD and anxiety.  He continues to do well on his current regimen-Adderall 30 mg 3 times daily for ADHD and Valium 10 mg at bedtime as needed for sleep and anxiety.  He will return to see me in 3 months   [name], MD 03/18/2021, 8:57 AM

## 2021-07-03 ENCOUNTER — Other Ambulatory Visit: Payer: Self-pay

## 2021-07-03 ENCOUNTER — Encounter (HOSPITAL_COMMUNITY): Payer: Self-pay | Admitting: Psychiatry

## 2021-07-03 ENCOUNTER — Telehealth (INDEPENDENT_AMBULATORY_CARE_PROVIDER_SITE_OTHER): Payer: BC Managed Care – PPO | Admitting: Psychiatry

## 2021-07-03 DIAGNOSIS — F902 Attention-deficit hyperactivity disorder, combined type: Secondary | ICD-10-CM

## 2021-07-03 MED ORDER — AMPHETAMINE-DEXTROAMPHETAMINE 30 MG PO TABS
30.0000 mg | ORAL_TABLET | Freq: Three times a day (TID) | ORAL | 0 refills | Status: DC
Start: 2021-07-03 — End: 2021-10-01

## 2021-07-03 MED ORDER — AMPHETAMINE-DEXTROAMPHETAMINE 30 MG PO TABS: ORAL_TABLET | ORAL | 0 refills | Status: DC

## 2021-07-03 MED ORDER — DIAZEPAM 10 MG PO TABS: 10.0000 mg | ORAL_TABLET | Freq: Every day | ORAL | 5 refills | Status: DC

## 2021-07-03 MED ORDER — AMPHETAMINE-DEXTROAMPHETAMINE 30 MG PO TABS: 1.0000 | ORAL_TABLET | Freq: Three times a day (TID) | ORAL | 0 refills | Status: DC

## 2021-07-03 NOTE — Progress Notes (Signed)
Virtual Visit via Telephone Note  I connected with Todd Gilbert on 07/03/21 at 10:20 AM EDT by telephone and verified that I am speaking with the correct person using two identifiers.  Location: Patient: home Provider: home office   I discussed the limitations, risks, security and privacy concerns of performing an evaluation and management service by telephone and the availability of in person appointments. I also discussed with the patient that there may be a patient responsible charge related to this service. The patient expressed understanding and agreed to proceed.      I discussed the assessment and treatment plan with the patient. The patient was provided an opportunity to ask questions and all were answered. The patient agreed with the plan and demonstrated an understanding of the instructions.   The patient was advised to call back or seek an in-person evaluation if the symptoms worsen or if the condition fails to improve as anticipated.  I provided 15 minutes of non-face-to-face time during this encounter.   [name], MD  [facility] MD/PA/NP OP Progress Note  07/03/2021 10:30 AM Todd Gilbert  MRN:  [id number]  Chief Complaint:  Chief Complaint   ADHD; Anxiety; Follow-up    HPI: This patient is a 50 year old married white male who lives with his wife in [place].  He has 3 children who live in [place].  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  The patient returns for follow-up after 3 months regarding his ADHD and anxiety.  He states that he is doing very well.  He really likes his job.  He is very through a temporary service but hopes to get hired on full-time in January.  At that point he will receive benefits.  He is focusing well with the Adderall.  The Valium helps him sleep.  He has no other specific complaints.  He states that he has had physicals and that his blood pressure has been normal Visit Diagnosis:    ICD-10-CM    1. ADHD (attention deficit hyperactivity disorder), combined type  F90.2 amphetamine-dextroamphetamine (ADDERALL) 30 MG tablet    amphetamine-dextroamphetamine (ADDERALL) 30 MG tablet    amphetamine-dextroamphetamine (ADDERALL) 30 MG tablet      Past Psychiatric History: none  Past Medical History:  Past Medical History:  Diagnosis Date   ADHD (attention deficit hyperactivity disorder)    Anxiety    Bipolar disorder (HCC)    Depression    Obsessive-compulsive disorder    Psychosis (HCC)    Right sciatic nerve pain     Past Surgical History:  Procedure Laterality Date   HAND TENDON SURGERY  10/13/1996    Family Psychiatric History: see below  Family History:  Family History  Problem Relation Age of Onset   Alcohol abuse Father    Anxiety disorder Father    Dementia Maternal Grandmother    OCD Neg Hx    ADD / ADHD Neg Hx    Bipolar disorder Neg Hx    Depression Neg Hx    Drug abuse Neg Hx    Paranoid behavior Neg Hx    Schizophrenia Neg Hx    Seizures Neg Hx    Sexual abuse Neg Hx    Physical abuse Neg Hx     Social History:  Social History   Socioeconomic History   Marital status: Married    Spouse name: Not on file   Number of children: Not on file   Years of education: Not  on file   Highest education level: Not on file  Occupational History   Not on file  Tobacco Use   Smoking status: Every Day    Types: Cigarettes    Last attempt to quit: 05/13/2012    Years since quitting: 9.1   Smokeless tobacco: Never   Tobacco comments:    quit a year ago  Substance and Sexual Activity   Alcohol use: No    Alcohol/week: 0.0 standard drinks   Drug use: No   Sexual activity: Yes  Other Topics Concern   Not on file  Social History Narrative   Not on file   Social Determinants of Health   Financial Resource Strain: Not on file  Food Insecurity: Not on file  Transportation Needs: Not on file  Physical Activity: Not on file  Stress: Not on file  Social  Connections: Not on file    Allergies:  Allergies  Allergen Reactions   Lithium Other (See Comments)    Bizarre behavior at night, climbing out the window and bringing mail into the bedroom   Tegretol [Carbamazepine] Other (See Comments)    Way out there VIVID dreams    Metabolic Disorder Labs: No results found for: HGBA1C, MPG No results found for: PROLACTIN No results found for: CHOL, TRIG, HDL, CHOLHDL, VLDL, LDLCALC No results found for: TSH  Therapeutic Level Labs: No results found for: LITHIUM No results found for: VALPROATE No components found for:  CBMZ  Current Medications: Current Outpatient Medications  Medication Sig Dispense Refill   amphetamine-dextroamphetamine (ADDERALL) 30 MG tablet Take 1 tablet by mouth 3 (three) times daily. 90 tablet 0   amphetamine-dextroamphetamine (ADDERALL) 30 MG tablet Take 1 tablet by mouth 3 (three) times daily. 90 tablet 0   amphetamine-dextroamphetamine (ADDERALL) 30 MG tablet TAKE 1 TABLET BY MOUTH THREE TIMES DAILY -- 90 tablet 0   diazepam (VALIUM) 10 MG tablet Take 1 tablet (10 mg total) by mouth at bedtime. 30 tablet 5   losartan (COZAAR) 50 MG tablet Take 50 mg by mouth daily.  2   No current facility-administered medications for this visit.     Musculoskeletal: Strength & Muscle Tone: na Gait & Station: na Patient leans: N/A  Psychiatric Specialty Exam: Review of Systems  All other systems reviewed and are negative.  There were no vitals taken for this visit.There is no height or weight on file to calculate BMI.  General Appearance: NA  Eye Contact:  NA  Speech:  Clear and Coherent  Volume:  Normal  Mood:  Euthymic  Affect:  NA  Thought Process:  Goal Directed  Orientation:  Full (Time, Place, and Person)  Thought Content: WDL   Suicidal Thoughts:  No  Homicidal Thoughts:  No  Memory:  Immediate;   Good Recent;   Good Remote;   Good  Judgement:  Good  Insight:  Fair  Psychomotor Activity:  Normal   Concentration:  Concentration: Good and Attention Span: Good  Recall:  Good  Fund of Knowledge: Good  Language: Good  Akathisia:  No  Handed:  Right  AIMS (if indicated): not done  Assets:  Communication Skills Desire for Improvement Physical Health Resilience  ADL's:  Intact  Cognition: WNL  Sleep:  Good   Screenings:   Assessment and Plan: Patient is a 50 year old male with a history of ADHD and anxiety.  He continues to do very well on his current regimen.  He will continue Adderall 30 mg 3 times daily for ADHD and  Valium 10 mg at bedtime as needed for sleep and anxiety.  He will return to see me in 3 months   [name], MD 07/03/2021, 10:30 AM

## 2021-10-01 ENCOUNTER — Encounter (HOSPITAL_COMMUNITY): Payer: Self-pay | Admitting: Psychiatry

## 2021-10-01 ENCOUNTER — Other Ambulatory Visit: Payer: Self-pay

## 2021-10-01 ENCOUNTER — Telehealth (INDEPENDENT_AMBULATORY_CARE_PROVIDER_SITE_OTHER): Payer: 59 | Admitting: Psychiatry

## 2021-10-01 DIAGNOSIS — F902 Attention-deficit hyperactivity disorder, combined type: Secondary | ICD-10-CM | POA: Diagnosis not present

## 2021-10-01 MED ORDER — AMPHETAMINE-DEXTROAMPHETAMINE 30 MG PO TABS: 1.0000 | ORAL_TABLET | Freq: Three times a day (TID) | ORAL | 0 refills | Status: DC

## 2021-10-01 MED ORDER — DIAZEPAM 10 MG PO TABS: 10.0000 mg | ORAL_TABLET | Freq: Every day | ORAL | 5 refills | Status: DC

## 2021-10-01 MED ORDER — AMPHETAMINE-DEXTROAMPHETAMINE 30 MG PO TABS: ORAL_TABLET | ORAL | 0 refills | Status: DC

## 2021-10-01 MED ORDER — AMPHETAMINE-DEXTROAMPHETAMINE 30 MG PO TABS: 30.0000 mg | ORAL_TABLET | Freq: Three times a day (TID) | ORAL | 0 refills | Status: DC

## 2021-10-01 NOTE — Progress Notes (Signed)
Virtual Visit via Telephone Note  I connected with Todd Gilbert on 10/01/21 at 11:20 AM EST by telephone and verified that I am speaking with the correct person using two identifiers.  Location: Patient: home Provider: office   I discussed the limitations, risks, security and privacy concerns of performing an evaluation and management service by telephone and the availability of in person appointments. I also discussed with the patient that there may be a patient responsible charge related to this service. The patient expressed understanding and agreed to proceed.    I discussed the assessment and treatment plan with the patient. The patient was provided an opportunity to ask questions and all were answered. The patient agreed with the plan and demonstrated an understanding of the instructions.   The patient was advised to call back or seek an in-person evaluation if the symptoms worsen or if the condition fails to improve as anticipated.  I provided 12 minutes of non-face-to-face time during this encounter.   [name], MD  [facility] MD/PA/NP OP Progress Note  10/01/2021 11:33 AM Todd Gilbert  MRN:  [id number]  Chief Complaint:  Chief Complaint   ADD; Anxiety; Follow-up    HPI: This patient is a 50 year old married white male who lives with his wife in [place].  He has 3 children who live in [place].  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  He states that he is doing very well.  He is enjoying his job.  He has been working through a [employer] and hopes that his job will become permanent next month.  The Valium continues to help him sleep and he is focusing well with the Adderall.  He denies any new health problems and claims that he is going to try to stop smoking. Visit Diagnosis:    ICD-10-CM   1. ADHD (attention deficit hyperactivity disorder),  combined type  F90.2 amphetamine-dextroamphetamine (ADDERALL) 30 MG tablet    amphetamine-dextroamphetamine (ADDERALL) 30 MG tablet    amphetamine-dextroamphetamine (ADDERALL) 30 MG tablet      Past Psychiatric History: none  Past Medical History:  Past Medical History:  Diagnosis Date   ADHD (attention deficit hyperactivity disorder)    Anxiety    Bipolar disorder (HCC)    Depression    Obsessive-compulsive disorder    Psychosis (HCC)    Right sciatic nerve pain     Past Surgical History:  Procedure Laterality Date   HAND TENDON SURGERY  10/13/1996    Family Psychiatric History: see below  Family History:  Family History  Problem Relation Age of Onset   Alcohol abuse Father    Anxiety disorder Father    Dementia Maternal Grandmother    OCD Neg Hx    ADD / ADHD Neg Hx    Bipolar disorder Neg Hx    Depression Neg Hx    Drug abuse Neg Hx    Paranoid behavior Neg Hx    Schizophrenia Neg Hx    Seizures Neg Hx    Sexual abuse Neg Hx    Physical abuse Neg Hx     Social History:  Social History   Socioeconomic History   Marital status: Married    Spouse name: Not on file   Number of children: Not on file   Years of education: Not on file   Highest education level: Not on file  Occupational  History   Not on file  Tobacco Use   Smoking status: Every Day    Types: Cigarettes    Last attempt to quit: 05/13/2012    Years since quitting: 9.3   Smokeless tobacco: Never   Tobacco comments:    quit a year ago  Substance and Sexual Activity   Alcohol use: No    Alcohol/week: 0.0 standard drinks   Drug use: No   Sexual activity: Yes  Other Topics Concern   Not on file  Social History Narrative   Not on file   Social Determinants of Health   Financial Resource Strain: Not on file  Food Insecurity: Not on file  Transportation Needs: Not on file  Physical Activity: Not on file  Stress: Not on file  Social Connections: Not on file    Allergies:  Allergies   Allergen Reactions   Lithium Other (See Comments)    Bizarre behavior at night, climbing out the window and bringing mail into the bedroom   Tegretol [Carbamazepine] Other (See Comments)    Way out there VIVID dreams    Metabolic Disorder Labs: No results found for: HGBA1C, MPG No results found for: PROLACTIN No results found for: CHOL, TRIG, HDL, CHOLHDL, VLDL, LDLCALC No results found for: TSH  Therapeutic Level Labs: No results found for: LITHIUM No results found for: VALPROATE No components found for:  CBMZ  Current Medications: Current Outpatient Medications  Medication Sig Dispense Refill   amphetamine-dextroamphetamine (ADDERALL) 30 MG tablet Take 1 tablet by mouth 3 (three) times daily. 90 tablet 0   amphetamine-dextroamphetamine (ADDERALL) 30 MG tablet Take 1 tablet by mouth 3 (three) times daily. 90 tablet 0   amphetamine-dextroamphetamine (ADDERALL) 30 MG tablet TAKE 1 TABLET BY MOUTH THREE TIMES DAILY -- 90 tablet 0   diazepam (VALIUM) 10 MG tablet Take 1 tablet (10 mg total) by mouth at bedtime. 30 tablet 5   losartan (COZAAR) 50 MG tablet Take 50 mg by mouth daily.  2   No current facility-administered medications for this visit.     Musculoskeletal: Strength & Muscle Tone: na Gait & Station: na Patient leans: N/A  Psychiatric Specialty Exam: Review of Systems  All other systems reviewed and are negative.  There were no vitals taken for this visit.There is no height or weight on file to calculate BMI.  General Appearance: NA  Eye Contact:  NA  Speech:  Clear and Coherent  Volume:  Normal  Mood:  Euthymic  Affect:  NA  Thought Process:  Goal Directed  Orientation:  Full (Time, Place, and Person)  Thought Content: WDL   Suicidal Thoughts:  No  Homicidal Thoughts:  No  Memory:  Immediate;   Good Recent;   Good Remote;   Good  Judgement:  Good  Insight:  Fair  Psychomotor Activity:  Normal  Concentration:  Concentration: Good and Attention Span:  Good  Recall:  Good  Fund of Knowledge: Good  Language: Good  Akathisia:  No  Handed:  Right  AIMS (if indicated): not done  Assets:  Communication Skills Desire for Improvement Physical Health Resilience Social Support Talents/Skills  ADL's:  Intact  Cognition: WNL  Sleep:  Good   Screenings:   Assessment and Plan: This patient is a 50 year old male with a history of ADHD and anxiety.  He continues to do well on his current regimen.  He will continue Adderall 30 mg 3 times daily for ADHD and Valium 10 mg at bedtime as needed for sleep  and anxiety.  He will return to see me in 3 months   Levonne Spiller, MD 10/01/2021, 11:33 AM

## 2021-10-31 ENCOUNTER — Telehealth (HOSPITAL_COMMUNITY): Payer: Self-pay | Admitting: *Deleted

## 2021-10-31 NOTE — Telephone Encounter (Signed)
Patient called stating that [facility] do not have his script in stock and would like for his script to be sent to [facility] off [address]   Medication is the Adderall.

## 2021-11-01 ENCOUNTER — Other Ambulatory Visit (HOSPITAL_COMMUNITY): Payer: Self-pay | Admitting: Psychiatry

## 2021-11-01 ENCOUNTER — Other Ambulatory Visit (HOSPITAL_COMMUNITY): Payer: Self-pay

## 2021-11-01 DIAGNOSIS — F902 Attention-deficit hyperactivity disorder, combined type: Secondary | ICD-10-CM

## 2021-11-01 MED ORDER — AMPHETAMINE-DEXTROAMPHETAMINE 30 MG PO TABS
30.0000 mg | ORAL_TABLET | Freq: Three times a day (TID) | ORAL | 0 refills | Status: DC
  Filled 2021-11-01: qty 90, 30d supply, fill #0

## 2021-11-01 NOTE — Telephone Encounter (Signed)
sent 

## 2021-12-25 ENCOUNTER — Telehealth (INDEPENDENT_AMBULATORY_CARE_PROVIDER_SITE_OTHER): Payer: 59 | Admitting: Psychiatry

## 2021-12-25 ENCOUNTER — Encounter (HOSPITAL_COMMUNITY): Payer: Self-pay | Admitting: Psychiatry

## 2021-12-25 ENCOUNTER — Other Ambulatory Visit: Payer: Self-pay

## 2021-12-25 DIAGNOSIS — F902 Attention-deficit hyperactivity disorder, combined type: Secondary | ICD-10-CM

## 2021-12-25 MED ORDER — AMPHETAMINE-DEXTROAMPHETAMINE 30 MG PO TABS: 30.0000 mg | ORAL_TABLET | Freq: Three times a day (TID) | ORAL | 0 refills | Status: DC

## 2021-12-25 MED ORDER — AMPHETAMINE-DEXTROAMPHETAMINE 30 MG PO TABS: 1.0000 | ORAL_TABLET | Freq: Three times a day (TID) | ORAL | 0 refills | Status: DC

## 2021-12-25 MED ORDER — DIAZEPAM 10 MG PO TABS: 10.0000 mg | ORAL_TABLET | Freq: Every day | ORAL | 5 refills | Status: DC

## 2021-12-25 NOTE — Progress Notes (Signed)
Virtual Visit via Telephone Note ? ?I connected with Todd Gilbert on 12/25/21 at 11:20 AM EDT by telephone and verified that I am speaking with the correct person using two identifiers. ? ?Location: ?Patient: home ?Provider: office ?  ?I discussed the limitations, risks, security and privacy concerns of performing an evaluation and management service by telephone and the availability of in person appointments. I also discussed with the patient that there may be a patient responsible charge related to this service. The patient expressed understanding and agreed to proceed. ? ? ? ? ?  ?I discussed the assessment and treatment plan with the patient. The patient was provided an opportunity to ask questions and all were answered. The patient agreed with the plan and demonstrated an understanding of the instructions. ?  ?The patient was advised to call back or seek an in-person evaluation if the symptoms worsen or if the condition fails to improve as anticipated. ? ?I provided 12 minutes of non-face-to-face time during this encounter. ? ? ?[name], MD ? ?[facility] MD/PA/NP OP Progress Note ? ?12/25/2021 11:57 AM ?Todd Gilbert  ?MRN:  [id number] ? ?Chief Complaint:  ?Chief Complaint  ?Patient presents with  ? ADHD  ? Anxiety  ? Follow-up  ? ?HPI: This patient is a 51 year old married white male who lives with his wife in [place].  He has 3 children who live in [place].  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 ?The patient returns for follow-up after 3 months regarding his ADHD and anxiety.  He continues to do very well.  He really likes his job now.  The Adderall continues to help him focus and he is sleeping well with the Valium.  He denies any new health concerns but has not had a checkup in a while.  I urged him to get in with a new PCP. ?Visit Diagnosis:  ?  ICD-10-CM   ?1. ADHD (attention deficit hyperactivity disorder), combined type  F90.2 amphetamine-dextroamphetamine  (ADDERALL) 30 MG tablet  ?  amphetamine-dextroamphetamine (ADDERALL) 30 MG tablet  ?  amphetamine-dextroamphetamine (ADDERALL) 30 MG tablet  ?  ? ? ?Past Psychiatric History: none ? ?Past Medical History:  ?Past Medical History:  ?Diagnosis Date  ? ADHD (attention deficit hyperactivity disorder)   ? Anxiety   ? Bipolar disorder (HCC)   ? Depression   ? Obsessive-compulsive disorder   ? Psychosis (HCC)   ? Right sciatic nerve pain   ?  ?Past Surgical History:  ?Procedure Laterality Date  ? HAND TENDON SURGERY  10/13/1996  ? ? ?Family Psychiatric History: see below ? ?Family History:  ?Family History  ?Problem Relation Age of Onset  ? Alcohol abuse Father   ? Anxiety disorder Father   ? Dementia Maternal Grandmother   ? OCD Neg Hx   ? ADD / ADHD Neg Hx   ? Bipolar disorder Neg Hx   ? Depression Neg Hx   ? Drug abuse Neg Hx   ? Paranoid behavior Neg Hx   ? Schizophrenia Neg Hx   ? Seizures Neg Hx   ? Sexual abuse Neg Hx   ? Physical abuse Neg Hx   ? ? ?Social History:  ?Social History  ? ?Socioeconomic History  ? Marital status: Married  ?  Spouse name: Not on file  ? Number of children: Not on file  ? Years of education: Not on file  ? Highest education level: Not on file  ?Occupational History  ?  Not on file  ?Tobacco Use  ? Smoking status: Every Day  ?  Types: Cigarettes  ?  Last attempt to quit: 05/13/2012  ?  Years since quitting: 9.6  ? Smokeless tobacco: Never  ? Tobacco comments:  ?  quit a year ago  ?Substance and Sexual Activity  ? Alcohol use: No  ?  Alcohol/week: 0.0 standard drinks  ? Drug use: No  ? Sexual activity: Yes  ?Other Topics Concern  ? Not on file  ?Social History Narrative  ? Not on file  ? ?Social Determinants of Health  ? ?Financial Resource Strain: Not on file  ?Food Insecurity: Not on file  ?Transportation Needs: Not on file  ?Physical Activity: Not on file  ?Stress: Not on file  ?Social Connections: Not on file  ? ? ?Allergies:  ?Allergies  ?Allergen Reactions  ? Lithium Other (See Comments)   ?  Bizarre behavior at night, climbing out the window and bringing mail into the bedroom  ? Tegretol [Carbamazepine] Other (See Comments)  ?  Way out there VIVID dreams  ? ? ?Metabolic Disorder Labs: ?No results found for: HGBA1C, MPG ?No results found for: PROLACTIN ?No results found for: CHOL, TRIG, HDL, CHOLHDL, VLDL, LDLCALC ?No results found for: TSH ? ?Therapeutic Level Labs: ?No results found for: LITHIUM ?No results found for: VALPROATE ?No components found for:  CBMZ ? ?Current Medications: ?Current Outpatient Medications  ?Medication Sig Dispense Refill  ? amphetamine-dextroamphetamine (ADDERALL) 30 MG tablet Take 1 tablet by mouth 3 (three) times daily. 90 tablet 0  ? amphetamine-dextroamphetamine (ADDERALL) 30 MG tablet Take 1 tablet by mouth 3 (three) times daily. 90 tablet 0  ? amphetamine-dextroamphetamine (ADDERALL) 30 MG tablet Take 1 tablet by mouth 3 (three) times daily. 90 tablet 0  ? diazepam (VALIUM) 10 MG tablet Take 1 tablet (10 mg total) by mouth at bedtime. 30 tablet 5  ? losartan (COZAAR) 50 MG tablet Take 50 mg by mouth daily.  2  ? ?No current facility-administered medications for this visit.  ? ? ? ?Musculoskeletal: ?Strength & Muscle Tone: within normal limits ?Gait & Station: normal ?Patient leans: N/A ? ?Psychiatric Specialty Exam: ?Review of Systems  ?All other systems reviewed and are negative.  ?There were no vitals taken for this visit.There is no height or weight on file to calculate BMI.  ?General Appearance: NA  ?Eye Contact:  NA  ?Speech:  Clear and Coherent  ?Volume:  Normal  ?Mood:  Euthymic  ?Affect:  NA  ?Thought Process:  Goal Directed  ?Orientation:  Full (Time, Place, and Person)  ?Thought Content: WDL   ?Suicidal Thoughts:  No  ?Homicidal Thoughts:  No  ?Memory:  Immediate;   Good ?Recent;   Good ?Remote;   NA  ?Judgement:  Good  ?Insight:  Fair  ?Psychomotor Activity:  Normal  ?Concentration:  Concentration: Good and Attention Span: Good  ?Recall:  Good  ?Fund of  Knowledge: Good  ?Language: Good  ?Akathisia:  No  ?Handed:  Right  ?AIMS (if indicated): not done  ?Assets:  Communication Skills ?Desire for Improvement ?Physical Health ?Resilience ?Social Support ?Talents/Skills  ?ADL's:  Intact  ?Cognition: WNL  ?Sleep:  Good  ? ?Screenings: ? ? ?Assessment and Plan: This patient is a 51 year old male with a history of ADHD and anxiety.  He continues to do well on his current regimen.  He will continue Adderall 30 mg 3 times daily for ADHD and Valium 10 mg at bedtime as needed for sleep and  anxiety.  He will return to see me in 3 months ? ?Collaboration of Care: Collaboration of Care: Primary Care Provider AEB chart notes will be provided to new PCP at patient's request ? ?Patient/Guardian was advised Release of Information must be obtained prior to any record release in order to collaborate their care with an outside provider. Patient/Guardian was advised if they have not already done so to contact the registration department to sign all necessary forms in order for [place] to release information regarding their care.  ? ?Consent: Patient/Guardian gives verbal consent for treatment and assignment of benefits for services provided during this visit. Patient/Guardian expressed understanding and agreed to proceed.  ? ? ?[name], MD ?12/25/2021, 11:57 AM ? ?

## 2022-02-03 ENCOUNTER — Emergency Department (HOSPITAL_COMMUNITY)
Admission: EM | Admit: 2022-02-03 | Discharge: 2022-02-03 | Disposition: A | Discharge status: Home / Self Care | Payer: 59 | Attending: Emergency Medicine | Admitting: Emergency Medicine

## 2022-02-03 ENCOUNTER — Encounter (HOSPITAL_COMMUNITY): Payer: Self-pay | Admitting: *Deleted

## 2022-02-03 ENCOUNTER — Other Ambulatory Visit: Payer: Self-pay

## 2022-02-03 ENCOUNTER — Emergency Department (HOSPITAL_COMMUNITY): Payer: 59

## 2022-02-03 DIAGNOSIS — I10 Essential (primary) hypertension: Secondary | ICD-10-CM | POA: Diagnosis not present

## 2022-02-03 DIAGNOSIS — Z79899 Other long term (current) drug therapy: Secondary | ICD-10-CM | POA: Insufficient documentation

## 2022-02-03 DIAGNOSIS — M25562 Pain in left knee: Secondary | ICD-10-CM

## 2022-02-03 DIAGNOSIS — M25462 Effusion, left knee: Secondary | ICD-10-CM | POA: Diagnosis not present

## 2022-02-03 MED ORDER — OXYCODONE-ACETAMINOPHEN 5-325 MG PO TABS
1.0000 | ORAL_TABLET | ORAL | 0 refills | Status: DC | PRN
Start: 2022-02-03 — End: 2022-03-28

## 2022-02-03 MED ORDER — OXYCODONE-ACETAMINOPHEN 5-325 MG PO TABS
1.0000 | ORAL_TABLET | Freq: Once | ORAL | Status: AC
  Administered 2022-02-03: 1 via ORAL
  Filled 2022-02-03: qty 1

## 2022-02-03 NOTE — ED Triage Notes (Signed)
Pt c/o left knee pain for a week; pt denies any obvious injury ?

## 2022-02-03 NOTE — ED Provider Notes (Signed)
?[facility] EMERGENCY DEPARTMENT ?Provider Note ? ? ?CSN: [id number] ?Arrival date & time: 02/03/22  1754 ? ?  ? ?History ? ?Chief Complaint  ?Patient presents with  ? Knee Pain  ? ? ?Todd Gilbert is a 51 y.o. male. ? ? ?Knee Pain ?Associated symptoms: no fever   ? ?  ? ? ?Todd Gilbert is a 51 y.o. male who presents to the Emergency Department complaining of left knee pain x2 weeks.  Pain began after he twisting injury.  He describes swelling to his knee at onset, but swelling has since improved.  Pain is worse with weightbearing, improves slightly at rest.  States at times his knee feels unstable and feels like it is going to "give away" he has been wearing an over-the-counter hinged knee brace for support.  He denies fever, chills, redness of his knee numbness or weakness of his extremity. ? ?Home Medications ?Prior to Admission medications   ?Medication Sig Start Date End Date Taking? Authorizing Provider  ?amphetamine-dextroamphetamine (ADDERALL) 30 MG tablet Take 1 tablet by mouth 3 (three) times daily. 12/25/21   Myrlene Broker, MD  ?amphetamine-dextroamphetamine (ADDERALL) 30 MG tablet Take 1 tablet by mouth 3 (three) times daily. 12/25/21   Myrlene Broker, MD  ?amphetamine-dextroamphetamine (ADDERALL) 30 MG tablet Take 1 tablet by mouth 3 (three) times daily. 12/25/21   Myrlene Broker, MD  ?diazepam (VALIUM) 10 MG tablet Take 1 tablet (10 mg total) by mouth at bedtime. 12/25/21   Myrlene Broker, MD  ?losartan (COZAAR) 50 MG tablet Take 50 mg by mouth daily. 05/29/16   [provider]  ?   ? ?Allergies    ?Lithium and Tegretol [carbamazepine]   ? ?Review of Systems   ?Review of Systems  ?Constitutional:  Negative for chills and fever.  ?Musculoskeletal:  Positive for arthralgias (Left knee pain).  ?Skin:  Negative for color change and wound.  ?Neurological:  Negative for dizziness, weakness and numbness.  ?All other systems reviewed and are negative. ? ?Physical Exam ?Updated Vital Signs ?BP  (!) 147/105   Pulse 74   Temp 98.1 ?F (36.7 ?C) (Oral)   Resp 16   Ht 6' (1.829 m)   Wt 95.3 kg   SpO2 97%   BMI 28.48 kg/m?  ?Physical Exam ?Vitals and nursing note reviewed.  ?Constitutional:   ?   General: He is not in acute distress. ?   Appearance: Normal appearance. He is not ill-appearing.  ?Cardiovascular:  ?   Rate and Rhythm: Normal rate and regular rhythm.  ?   Pulses: Normal pulses.  ?Pulmonary:  ?   Effort: Pulmonary effort is normal. No respiratory distress.  ?Chest:  ?   Chest wall: No tenderness.  ?Musculoskeletal:     ?   General: Tenderness and signs of injury present.  ?   Left knee: Effusion and crepitus present. No erythema or lacerations. Normal range of motion. Tenderness present over the medial joint line. No patellar tendon tenderness.  ?   Instability Tests: Anterior drawer test negative.  ?   Comments: Tenderness palpation over the medial meniscus.  There is mild effusion of the knee noted.  No excessive warmth or erythema.  Some crepitus of the patella on range of motion.  Pain with valgus and varus stress.  ?Skin: ?   General: Skin is warm.  ?   Capillary Refill: Capillary refill takes less than 2 seconds.  ?   Findings: No bruising or erythema.  ?Neurological:  ?  General: No focal deficit present.  ?   Mental Status: He is alert.  ?   Sensory: No sensory deficit.  ?   Motor: No weakness.  ? ? ?ED Results / Procedures / Treatments   ?Labs ?(all labs ordered are listed, but only abnormal results are displayed) ?Labs Reviewed - No data to display ? ?EKG ?None ? ?Radiology ?DG Knee Complete 4 Views Left ? ?Result Date: 02/03/2022 ?CLINICAL DATA:  Knee pain EXAM: LEFT KNEE - COMPLETE 4+ VIEW COMPARISON:  None. FINDINGS: No fracture or malalignment. Small knee effusion. The joint spaces appear patent. IMPRESSION: Small knee effusion.  No acute osseous abnormality Electronically Signed   By: Jasmine Pang M.D.   On: 02/03/2022 19:36   ? ?Procedures ?Procedures  ? ? ?Medications Ordered  in ED ?Medications  ?oxyCODONE-acetaminophen (PERCOCET/ROXICET) 5-325 MG per tablet 1 tablet (has no administration in time range)  ? ? ?ED Course/ Medical Decision Making/ A&P ?  ?                        ?Medical Decision Making ?Amount and/or Complexity of Data Reviewed ?Radiology: ordered. ? ?Risk ?Prescription drug management. ? ? ?Patient here for evaluation of 2-week history of left knee pain.  Endorses a twisting injury of his knee but cannot recall a specific event.  States his knee feels unstable when walking or standing.  No fever, chills, numbness or weakness of the extremity. ? ?X-ray shows a small effusion without acute bony findings.  He does have tenderness to the medial meniscus on exam.  No obvious ligamentous instability.  Possible meniscal injury.  He is wearing an over-the-counter hinged knee brace.  We will dispense crutches to use when walking or standing.  Ice to the knee.  No indication of need for arthrocentesis, do not suspect septic joint.  He is mildly hypertensive, does take losartan daily.  I feel that he is appropriate for discharge home.  He will follow-up closely with orthopedics.  Short course of pain medication prescribed. ? ? ? ? ? ? ? ?Final Clinical Impression(s) / ED Diagnoses ?Final diagnoses:  ?Acute pain of left knee  ? ? ?Rx / DC Orders ?ED Discharge Orders   ? ? None  ? ?  ? ? ?  ?Pauline Aus, PA-C ?02/03/22 2214 ? ?  ?Gloris Manchester, MD ?02/05/22 0206 ? ?

## 2022-02-03 NOTE — Discharge Instructions (Signed)
Continue to wear your knee brace when walking or standing.  Use the crutches for extra support.  Apply ice packs on and off to your knee.  Call the orthopedic provider listed to arrange a follow-up appointment ?

## 2022-02-14 ENCOUNTER — Ambulatory Visit: Payer: 59 | Admitting: Orthopedic Surgery

## 2022-02-14 ENCOUNTER — Encounter: Payer: Self-pay | Admitting: Orthopedic Surgery

## 2022-02-14 DIAGNOSIS — S83242A Other tear of medial meniscus, current injury, left knee, initial encounter: Secondary | ICD-10-CM | POA: Diagnosis not present

## 2022-02-14 NOTE — Patient Instructions (Addendum)
Use ice and ibuprofen for pain  ? ?Do not take opioids  ? ?OOW x 1 week  ?

## 2022-02-14 NOTE — Progress Notes (Addendum)
NEW PROBLEM//OFFICE VISIT ? ? ?Chief Complaint  ?Patient presents with  ? Knee Pain  ?  Left knee pain twisted it somehow. Pain x 1 month or so.  Wearing knee brace.  Medial knee pain.  + give way, usually wraps it up with knee sleeve, brace and tape real tight to keep it from giving way.   ? ?HPI ? ?51 year old male twisted left knee pain for months tried a knee brace complains of pain and giving way no major swelling ? ?Note the patient says he jumped up on a ledge at work and may have injured it then then had an episode where he jumped down off of a high ledge and then twisted the knee and started having pain and has not gotten better since whereas on the other occasion it did get better ? ? ?ROS: Negative ?ROS ? ? ? ? ?There is no height or weight on file to calculate BMI. ? ?General appearance: Well-developed well-nourished no gross deformities ? ?Cardiovascular normal pulse and perfusion normal color without edema ? ?Neurologically no sensation loss or deficits or pathologic reflexes ? ?Psychological: Awake alert and oriented x3 mood and affect normal ? ?Skin no lacerations or ulcerations no nodularity no palpable masses, no erythema or nodularity ? ?Musculoskeletal: Pain tenderness swelling left knee appears to have some laxity in the joint collateral ligaments feel stable ? ? ? ? ? ?Past Medical History:  ?Diagnosis Date  ? ADHD (attention deficit hyperactivity disorder)   ? Anxiety   ? Bipolar disorder (HCC)   ? Depression   ? Obsessive-compulsive disorder   ? Psychosis (HCC)   ? Right sciatic nerve pain   ? ? ?Past Surgical History:  ?Procedure Laterality Date  ? HAND TENDON SURGERY  10/13/1996  ? ? ?Family History  ?Problem Relation Age of Onset  ? Alcohol abuse Father   ? Anxiety disorder Father   ? Dementia Maternal Grandmother   ? OCD Neg Hx   ? ADD / ADHD Neg Hx   ? Bipolar disorder Neg Hx   ? Depression Neg Hx   ? Drug abuse Neg Hx   ? Paranoid behavior Neg Hx   ? Schizophrenia Neg Hx   ? Seizures Neg  Hx   ? Sexual abuse Neg Hx   ? Physical abuse Neg Hx   ? ?Social History  ? ?Tobacco Use  ? Smoking status: Every Day  ?  Types: Cigarettes  ?  Last attempt to quit: 05/13/2012  ?  Years since quitting: 9.7  ? Smokeless tobacco: Never  ? Tobacco comments:  ?  quit a year ago  ?Substance Use Topics  ? Alcohol use: No  ?  Alcohol/week: 0.0 standard drinks  ? Drug use: No  ? ? ?Allergies  ?Allergen Reactions  ? Lithium Other (See Comments)  ?  Bizarre behavior at night, climbing out the window and bringing mail into the bedroom  ? Tegretol [Carbamazepine] Other (See Comments)  ?  Way out there VIVID dreams  ? ? ?Current Meds  ?Medication Sig  ? amphetamine-dextroamphetamine (ADDERALL) 30 MG tablet Take 1 tablet by mouth 3 (three) times daily.  ? amphetamine-dextroamphetamine (ADDERALL) 30 MG tablet Take 1 tablet by mouth 3 (three) times daily.  ? amphetamine-dextroamphetamine (ADDERALL) 30 MG tablet Take 1 tablet by mouth 3 (three) times daily.  ? diazepam (VALIUM) 10 MG tablet Take 1 tablet (10 mg total) by mouth at bedtime.  ? losartan (COZAAR) 50 MG tablet Take 50 mg by mouth  daily.  ? ? ? ?MEDICAL DECISION MAKING ? ?A.  ?Encounter Diagnosis  ?Name Primary?  ? Acute medial meniscus tear of left knee, initial encounter Yes  ? ? ?B. DATA ANALYSED: ? ? ?IMAGING: ?Interpretation of images: I have personally reviewed the images and my interpretation is no evidence of acute fracture ?Orders: MRI ? ?Outside records reviewed: None ? ? ?C. MANAGEMENT  ? ?Awaiting to MRI for definitive management ? ?No orders of the defined types were placed in this encounter. ? ? ? ?Fuller Canada, MD ? ?02/24/2022 ?3:10 PM ?

## 2022-02-19 ENCOUNTER — Encounter (HOSPITAL_COMMUNITY): Payer: Self-pay | Admitting: Radiology

## 2022-02-19 ENCOUNTER — Ambulatory Visit (HOSPITAL_COMMUNITY)
Admission: RE | Admit: 2022-02-19 | Discharge: 2022-02-19 | Disposition: A | Discharge status: Home / Self Care | Payer: 59 | Source: Ambulatory Visit | Attending: Orthopedic Surgery | Admitting: Orthopedic Surgery

## 2022-02-19 DIAGNOSIS — M1712 Unilateral primary osteoarthritis, left knee: Secondary | ICD-10-CM | POA: Diagnosis not present

## 2022-02-19 DIAGNOSIS — S83242A Other tear of medial meniscus, current injury, left knee, initial encounter: Secondary | ICD-10-CM | POA: Insufficient documentation

## 2022-02-19 DIAGNOSIS — S83512A Sprain of anterior cruciate ligament of left knee, initial encounter: Secondary | ICD-10-CM | POA: Diagnosis not present

## 2022-02-19 DIAGNOSIS — M25462 Effusion, left knee: Secondary | ICD-10-CM | POA: Diagnosis not present

## 2022-02-24 ENCOUNTER — Ambulatory Visit (INDEPENDENT_AMBULATORY_CARE_PROVIDER_SITE_OTHER): Payer: 59 | Admitting: Orthopedic Surgery

## 2022-02-24 DIAGNOSIS — M84352A Stress fracture, left femur, initial encounter for fracture: Secondary | ICD-10-CM | POA: Diagnosis not present

## 2022-02-24 NOTE — Progress Notes (Signed)
FOLLOW UP  ? ?Encounter Diagnosis  ?Name Primary?  ? Stress fracture of left femur, initial encounter medial femoral condyle Yes  ? ? ? ?Chief Complaint  ?Patient presents with  ? Results  ?  MRI Left Knee, still having pain  ? ? ?EXTENSOR MECHANISM: Intact quadriceps tendon. Intact patellar ?tendon. Intact lateral patellar retinaculum. Intact medial patellar ?retinaculum. Intact MPFL. ?  ?BONES: No aggressive osseous lesion. No fracture or dislocation. ?Severe bone marrow edema in the medial femoral condyle which may ?reflect early spontaneous osteonecrosis of the knee versus stress ?reaction versus osseous contusion secondary to direct trauma. ?  ?Other: No fluid collection or hematoma. Muscles are normal. ?  ?IMPRESSION: ?1. Complete chronic proximal ACL tear. ?2. Severe bone marrow edema in the medial femoral condyle which may ?reflect early spontaneous osteonecrosis of the knee versus stress ?reaction versus osseous contusion secondary to direct trauma. ?  ?  ?Electronically Signed ?  By: Kathreen Devoid M.D. ?  On: 02/23/2022 ? ? ?This MRI shows what is most likely stress reaction of bone contusion and a chronically deficient ACL knee ? ?I read the MRI I agree with the report on interpretation is that there is no acute injury there is a chronic ACL tear with bone contusion versus stress fracture medial femoral condyle ? ? ?Commend hinged brace 8 weeks ? ?Repeat clinical exam in 8 weeks ? ?Patient should limit his activities ? ?If this does not control his pain and he does not heal he will be referred out for knee replacement he is too young for me to do it. ? ?

## 2022-02-24 NOTE — Patient Instructions (Signed)
Limit your activities ? ?Wear the brace for all activity and walking ? ? ?8-week follow-up ?

## 2022-03-04 ENCOUNTER — Ambulatory Visit (HOSPITAL_COMMUNITY): Payer: 59

## 2022-03-16 DIAGNOSIS — W228XXA Striking against or struck by other objects, initial encounter: Secondary | ICD-10-CM | POA: Diagnosis not present

## 2022-03-16 DIAGNOSIS — Z79899 Other long term (current) drug therapy: Secondary | ICD-10-CM | POA: Diagnosis not present

## 2022-03-16 DIAGNOSIS — S81812A Laceration without foreign body, left lower leg, initial encounter: Secondary | ICD-10-CM | POA: Diagnosis not present

## 2022-03-16 DIAGNOSIS — S8012XA Contusion of left lower leg, initial encounter: Secondary | ICD-10-CM | POA: Diagnosis not present

## 2022-03-16 DIAGNOSIS — F909 Attention-deficit hyperactivity disorder, unspecified type: Secondary | ICD-10-CM | POA: Diagnosis not present

## 2022-03-27 ENCOUNTER — Telehealth (HOSPITAL_COMMUNITY): Payer: Self-pay | Admitting: *Deleted

## 2022-03-27 ENCOUNTER — Telehealth (HOSPITAL_COMMUNITY): Payer: 59 | Admitting: Psychiatry

## 2022-03-27 NOTE — Telephone Encounter (Signed)
Patient called stating he accidentally forgot to be outside today for his 9am appt but he really need his medications today. [phone]. Per pt when he's in this work his phone do not work.

## 2022-03-28 ENCOUNTER — Encounter (HOSPITAL_COMMUNITY): Payer: Self-pay | Admitting: Psychiatry

## 2022-03-28 ENCOUNTER — Telehealth (INDEPENDENT_AMBULATORY_CARE_PROVIDER_SITE_OTHER): Payer: 59 | Admitting: Psychiatry

## 2022-03-28 DIAGNOSIS — F5105 Insomnia due to other mental disorder: Secondary | ICD-10-CM | POA: Diagnosis not present

## 2022-03-28 DIAGNOSIS — F902 Attention-deficit hyperactivity disorder, combined type: Secondary | ICD-10-CM | POA: Diagnosis not present

## 2022-03-28 MED ORDER — DIAZEPAM 10 MG PO TABS: 10.0000 mg | ORAL_TABLET | Freq: Every day | ORAL | 5 refills | Status: DC

## 2022-03-28 MED ORDER — AMPHETAMINE-DEXTROAMPHETAMINE 30 MG PO TABS: 30.0000 mg | ORAL_TABLET | Freq: Three times a day (TID) | ORAL | 0 refills | Status: DC

## 2022-03-28 MED ORDER — AMPHETAMINE-DEXTROAMPHETAMINE 30 MG PO TABS: 1.0000 | ORAL_TABLET | Freq: Three times a day (TID) | ORAL | 0 refills | Status: DC

## 2022-03-28 NOTE — Telephone Encounter (Signed)
noted 

## 2022-03-28 NOTE — Telephone Encounter (Signed)
His appt is at 10

## 2022-03-28 NOTE — Progress Notes (Signed)
Virtual Visit via Telephone Note  I connected with Todd Gilbert on 03/28/22 at 10:00 AM EDT by telephone and verified that I am speaking with the correct person using two identifiers.  Location: Patient: home Provider: home office   I discussed the limitations, risks, security and privacy concerns of performing an evaluation and management service by telephone and the availability of in person appointments. I also discussed with the patient that there may be a patient responsible charge related to this service. The patient expressed understanding and agreed to proceed.     I discussed the assessment and treatment plan with the patient. The patient was provided an opportunity to ask questions and all were answered. The patient agreed with the plan and demonstrated an understanding of the instructions.   The patient was advised to call back or seek an in-person evaluation if the symptoms worsen or if the condition fails to improve as anticipated.  I provided 15 minutes of non-face-to-face time during this encounter.   Levonne Spiller, MD  [facility] MD/PA/NP OP Progress Note  03/28/2022 10:11 AM Todd Gilbert  MRN:  [id number]  Chief Complaint:  Chief Complaint  Patient presents with   ADHD   Anxiety   HPI: This patient is a 51 year old married white male who lives with his wife in [place].  He has 3 children who live in [place].  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  He recently got injured when he banged into a weight table at his home when the power went out.  He required 18 stitches in his left leg but it is slowly healing.  He also has a meniscus tear in the knee on that side.  Nevertheless he is gone back to work.  He states that his focus is good and he is enjoying his job.  He is sleeping well.  He denies any symptoms of depression or anxiety.   Visit  Diagnosis:    ICD-10-CM   1. ADHD (attention deficit hyperactivity disorder), combined type  F90.2 amphetamine-dextroamphetamine (ADDERALL) 30 MG tablet    amphetamine-dextroamphetamine (ADDERALL) 30 MG tablet    amphetamine-dextroamphetamine (ADDERALL) 30 MG tablet    2. Insomnia due to mental disorder  F51.05       Past Psychiatric History: none  Past Medical History:  Past Medical History:  Diagnosis Date   ADHD (attention deficit hyperactivity disorder)    Anxiety    Bipolar disorder ([place])    Depression    Obsessive-compulsive disorder    Psychosis ([place])    Right sciatic nerve pain     Past Surgical History:  Procedure Laterality Date   HAND TENDON SURGERY  10/13/1996    Family Psychiatric History: See below  Family History:  Family History  Problem Relation Age of Onset   Alcohol abuse Father    Anxiety disorder Father    Dementia Maternal Grandmother    OCD Neg Hx    ADD / ADHD Neg Hx    Bipolar disorder Neg Hx    Depression Neg Hx    Drug abuse Neg Hx    Paranoid behavior Neg Hx    Schizophrenia Neg Hx    Seizures Neg Hx    Sexual abuse Neg Hx    Physical abuse Neg Hx     Social History:  Social History   Socioeconomic History   Marital status:  Married    Spouse name: Not on file   Number of children: Not on file   Years of education: Not on file   Highest education level: Not on file  Occupational History   Not on file  Tobacco Use   Smoking status: Every Day    Types: Cigarettes    Last attempt to quit: 05/13/2012    Years since quitting: 9.8   Smokeless tobacco: Never   Tobacco comments:    quit a year ago  Substance and Sexual Activity   Alcohol use: No    Alcohol/week: 0.0 standard drinks of alcohol   Drug use: No   Sexual activity: Yes  Other Topics Concern   Not on file  Social History Narrative   Not on file   Social Determinants of Health   Financial Resource Strain: Not on file  Food Insecurity: Not on file  Transportation  Needs: Not on file  Physical Activity: Not on file  Stress: Not on file  Social Connections: Not on file    Allergies:  Allergies  Allergen Reactions   Lithium Other (See Comments)    Bizarre behavior at night, climbing out the window and bringing mail into the bedroom   Tegretol [Carbamazepine] Other (See Comments)    Way out there VIVID dreams    Metabolic Disorder Labs: No results found for: "HGBA1C", "MPG" No results found for: "PROLACTIN" No results found for: "CHOL", "TRIG", "HDL", "CHOLHDL", "VLDL", "LDLCALC" No results found for: "TSH"  Therapeutic Level Labs: No results found for: "LITHIUM" No results found for: "VALPROATE" No results found for: "CBMZ"  Current Medications: Current Outpatient Medications  Medication Sig Dispense Refill   amphetamine-dextroamphetamine (ADDERALL) 30 MG tablet Take 1 tablet by mouth 3 (three) times daily. 90 tablet 0   amphetamine-dextroamphetamine (ADDERALL) 30 MG tablet Take 1 tablet by mouth 3 (three) times daily. 90 tablet 0   amphetamine-dextroamphetamine (ADDERALL) 30 MG tablet Take 1 tablet by mouth 3 (three) times daily. 90 tablet 0   diazepam (VALIUM) 10 MG tablet Take 1 tablet (10 mg total) by mouth at bedtime. 30 tablet 5   losartan (COZAAR) 50 MG tablet Take 50 mg by mouth daily.  2   No current facility-administered medications for this visit.     Musculoskeletal: Strength & Muscle Tone: na Gait & Station: na Patient leans: N/A  Psychiatric Specialty Exam: Review of Systems  Musculoskeletal:  Positive for arthralgias.  All other systems reviewed and are negative.   There were no vitals taken for this visit.There is no height or weight on file to calculate BMI.  General Appearance: NA  Eye Contact:  NA  Speech:  Clear and Coherent  Volume:  Normal  Mood:  Euthymic  Affect:  NA  Thought Process:  Goal Directed  Orientation:  Full (Time, Place, and Person)  Thought Content: WDL   Suicidal Thoughts:  No   Homicidal Thoughts:  No  Memory:  Immediate;   Good Recent;   Good Remote;   Good  Judgement:  Good  Insight:  Fair  Psychomotor Activity:  Normal  Concentration:  Concentration: Good and Attention Span: Good  Recall:  Good  Fund of Knowledge: Good  Language: Good  Akathisia:  No  Handed:  Right  AIMS (if indicated): not done  Assets:  Communication Skills Desire for Improvement Physical Health Resilience Social Support  ADL's:  Intact  Cognition: WNL  Sleep:  Good   Screenings: Flowsheet Row ED from 02/03/2022 in [place]  EMERGENCY DEPARTMENT  C-SSRS RISK CATEGORY No Risk        Assessment and Plan: This patient is a 51 year old male with a history of ADHD and anxiety.  He continues to do well on his current regimen.  He will continue Adderall 30 mg 3 times daily for ADHD and Valium 10 mg at bedtime as needed for sleep and anxiety.  He will return to see me in 3 months  Collaboration of Care: Collaboration of Care: Primary Care Provider AEB notes will be shared with PCP at patient's request  Patient/Guardian was advised Release of Information must be obtained prior to any record release in order to collaborate their care with an outside provider. Patient/Guardian was advised if they have not already done so to contact the registration department to sign all necessary forms in order for [place] to release information regarding their care.   Consent: Patient/Guardian gives verbal consent for treatment and assignment of benefits for services provided during this visit. Patient/Guardian expressed understanding and agreed to proceed.    [name], MD 03/28/2022, 10:11 AM

## 2022-04-21 ENCOUNTER — Ambulatory Visit: Payer: 59 | Admitting: Orthopedic Surgery

## 2022-04-21 ENCOUNTER — Telehealth: Payer: Self-pay | Admitting: Orthopedic Surgery

## 2022-04-21 NOTE — Telephone Encounter (Signed)
Called patient  regarding today's scheduled appointment at 10:10am, as he did not attend. He said his knee feels better; said he will call back if he needs to reschedule.

## 2022-06-18 ENCOUNTER — Other Ambulatory Visit (HOSPITAL_COMMUNITY): Payer: Self-pay | Admitting: Psychiatry

## 2022-06-18 ENCOUNTER — Telehealth (HOSPITAL_COMMUNITY): Payer: Self-pay

## 2022-06-18 DIAGNOSIS — F902 Attention-deficit hyperactivity disorder, combined type: Secondary | ICD-10-CM

## 2022-06-18 MED ORDER — AMPHETAMINE-DEXTROAMPHETAMINE 30 MG PO TABS: 30.0000 mg | ORAL_TABLET | Freq: Three times a day (TID) | ORAL | 0 refills | Status: DC

## 2022-06-18 NOTE — Telephone Encounter (Signed)
Patient is needing refills for his Adderall. Appt was resch. Per pt chart, he should have enough Valium at pharmacy ([facility]).

## 2022-06-18 NOTE — Telephone Encounter (Signed)
sent 

## 2022-06-18 NOTE — Telephone Encounter (Signed)
Needs refill on Adderall and Valium. He was rescheduled to 9/21 from 9/8.[facility]

## 2022-06-20 ENCOUNTER — Telehealth (HOSPITAL_COMMUNITY): Payer: 59 | Admitting: Psychiatry

## 2022-07-03 ENCOUNTER — Encounter (HOSPITAL_COMMUNITY): Payer: Self-pay | Admitting: Psychiatry

## 2022-07-03 ENCOUNTER — Telehealth (INDEPENDENT_AMBULATORY_CARE_PROVIDER_SITE_OTHER): Payer: 59 | Admitting: Psychiatry

## 2022-07-03 ENCOUNTER — Telehealth (HOSPITAL_COMMUNITY): Payer: 59 | Admitting: Psychiatry

## 2022-07-03 DIAGNOSIS — F902 Attention-deficit hyperactivity disorder, combined type: Secondary | ICD-10-CM | POA: Diagnosis not present

## 2022-07-03 MED ORDER — AMPHETAMINE-DEXTROAMPHETAMINE 30 MG PO TABS: 30.0000 mg | ORAL_TABLET | Freq: Three times a day (TID) | ORAL | 0 refills | Status: DC

## 2022-07-03 MED ORDER — AMPHETAMINE-DEXTROAMPHETAMINE 30 MG PO TABS: 1.0000 | ORAL_TABLET | Freq: Three times a day (TID) | ORAL | 0 refills | Status: DC

## 2022-07-03 MED ORDER — DIAZEPAM 10 MG PO TABS: 10.0000 mg | ORAL_TABLET | Freq: Every day | ORAL | 5 refills | Status: DC

## 2022-07-03 NOTE — Progress Notes (Signed)
Virtual Visit via Telephone Note  I connected with Todd Gilbert on 07/03/22 at 10:00 AM EDT by telephone and verified that I am speaking with the correct person using two identifiers.  Location: Patient: home Provider: office   I discussed the limitations, risks, security and privacy concerns of performing an evaluation and management service by telephone and the availability of in person appointments. I also discussed with the patient that there may be a patient responsible charge related to this service. The patient expressed understanding and agreed to proceed.     I discussed the assessment and treatment plan with the patient. The patient was provided an opportunity to ask questions and all were answered. The patient agreed with the plan and demonstrated an understanding of the instructions.   The patient was advised to call back or seek an in-person evaluation if the symptoms worsen or if the condition fails to improve as anticipated.  I provided 15 minutes of non-face-to-face time during this encounter.   Levonne Spiller, MD  [facility] MD/PA/NP OP Progress Note  07/03/2022 10:09 AM Todd Gilbert  MRN:  [id number]  Chief Complaint:  Chief Complaint  Patient presents with   Anxiety   ADHD   Follow-up   HPI: This patient is a 51 year old married white male who lives with his wife in [place].  He has 3 children who live in [place].  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  The patient returns for follow-up after 3 months regarding his ADHD and anxiety.  He states that he quit his job recently because "he was tired of waiting for them to hire me full-time.  He had been working as a Physicist, medical.  He is going to try to find something else.  He states that he is focusing well with the Adderall and denies any problems with anxiety or depression.  He is sleeping well with the Valium. Visit Diagnosis:    ICD-10-CM   1. ADHD (attention  deficit hyperactivity disorder), combined type  F90.2 amphetamine-dextroamphetamine (ADDERALL) 30 MG tablet    amphetamine-dextroamphetamine (ADDERALL) 30 MG tablet    amphetamine-dextroamphetamine (ADDERALL) 30 MG tablet      Past Psychiatric History: none  Past Medical History:  Past Medical History:  Diagnosis Date   ADHD (attention deficit hyperactivity disorder)    Anxiety    Bipolar disorder (HCC)    Depression    Obsessive-compulsive disorder    Psychosis ([facility])    Right sciatic nerve pain     Past Surgical History:  Procedure Laterality Date   HAND TENDON SURGERY  10/13/1996    Family Psychiatric History: See below  Family History:  Family History  Problem Relation Age of Onset   Alcohol abuse Father    Anxiety disorder Father    Dementia Maternal Grandmother    OCD Neg Hx    ADD / ADHD Neg Hx    Bipolar disorder Neg Hx    Depression Neg Hx    Drug abuse Neg Hx    Paranoid behavior Neg Hx    Schizophrenia Neg Hx    Seizures Neg Hx    Sexual abuse Neg Hx    Physical abuse Neg Hx     Social History:  Social History   Socioeconomic History   Marital status: Married    Spouse name: Not on file   Number of children: Not on file   Years of education: Not on file  Highest education level: Not on file  Occupational History   Not on file  Tobacco Use   Smoking status: Every Day    Types: Cigarettes    Last attempt to quit: 05/13/2012    Years since quitting: 10.1   Smokeless tobacco: Never   Tobacco comments:    quit a year ago  Substance and Sexual Activity   Alcohol use: No    Alcohol/week: 0.0 standard drinks of alcohol   Drug use: No   Sexual activity: Yes  Other Topics Concern   Not on file  Social History Narrative   Not on file   Social Determinants of Health   Financial Resource Strain: Not on file  Food Insecurity: Not on file  Transportation Needs: Not on file  Physical Activity: Not on file  Stress: Not on file  Social Connections:  Not on file    Allergies:  Allergies  Allergen Reactions   Lithium Other (See Comments)    Bizarre behavior at night, climbing out the window and bringing mail into the bedroom   Tegretol [Carbamazepine] Other (See Comments)    Way out there VIVID dreams    Metabolic Disorder Labs: No results found for: "HGBA1C", "MPG" No results found for: "PROLACTIN" No results found for: "CHOL", "TRIG", "HDL", "CHOLHDL", "VLDL", "LDLCALC" No results found for: "TSH"  Therapeutic Level Labs: No results found for: "LITHIUM" No results found for: "VALPROATE" No results found for: "CBMZ"  Current Medications: Current Outpatient Medications  Medication Sig Dispense Refill   amphetamine-dextroamphetamine (ADDERALL) 30 MG tablet Take 1 tablet by mouth 3 (three) times daily. 90 tablet 0   amphetamine-dextroamphetamine (ADDERALL) 30 MG tablet Take 1 tablet by mouth 3 (three) times daily. 90 tablet 0   amphetamine-dextroamphetamine (ADDERALL) 30 MG tablet Take 1 tablet by mouth 3 (three) times daily. 90 tablet 0   diazepam (VALIUM) 10 MG tablet Take 1 tablet (10 mg total) by mouth at bedtime. 30 tablet 5   losartan (COZAAR) 50 MG tablet Take 50 mg by mouth daily.  2   No current facility-administered medications for this visit.     Musculoskeletal: Strength & Muscle Tone: na Gait & Station: na Patient leans: N/A  Psychiatric Specialty Exam: Review of Systems  There were no vitals taken for this visit.There is no height or weight on file to calculate BMI.  General Appearance: NA  Eye Contact:  NA  Speech:  Clear and Coherent  Volume:  Normal  Mood:  Euthymic  Affect:  NA  Thought Process:  Goal Directed  Orientation:  Full (Time, Place, and Person)  Thought Content: WDL   Suicidal Thoughts:  No  Homicidal Thoughts:  No  Memory:  Immediate;   Good Recent;   Good Remote;   Good  Judgement:  Good  Insight:  Fair  Psychomotor Activity:  Normal  Concentration:  Concentration: Good and  Attention Span: Good  Recall:  Good  Fund of Knowledge: Good  Language: Good  Akathisia:  No  Handed:  Right  AIMS (if indicated): not done  Assets:  Communication Skills Desire for Improvement Physical Health Resilience Social Support Talents/Skills  ADL's:  Intact  Cognition: WNL  Sleep:  Good   Screenings: Flowsheet Row ED from 02/03/2022 in [place] [facility] DEPARTMENT  C-SSRS RISK CATEGORY No Risk        Assessment and Plan: This patient is a 51 year old male with a history of ADHD and anxiety.  He continues to do well on his current regimen.  He will continue Adderall 30 mg 3 times daily for ADHD and Valium 10 mg at bedtime as needed for sleep and anxiety.  He will return to see me in 3 months  Collaboration of Care: Collaboration of Care: Primary Care Provider AEB notes will be shared with PCP at patient's request  Patient/Guardian was advised Release of Information must be obtained prior to any record release in order to collaborate their care with an outside provider. Patient/Guardian was advised if they have not already done so to contact the registration department to sign all necessary forms in order for [place] to release information regarding their care.   Consent: Patient/Guardian gives verbal consent for treatment and assignment of benefits for services provided during this visit. Patient/Guardian expressed understanding and agreed to proceed.    [name], MD 07/03/2022, 10:09 AM

## 2022-09-15 ENCOUNTER — Encounter (HOSPITAL_COMMUNITY): Payer: Self-pay | Admitting: Psychiatry

## 2022-09-15 ENCOUNTER — Telehealth (INDEPENDENT_AMBULATORY_CARE_PROVIDER_SITE_OTHER): Payer: 59 | Admitting: Psychiatry

## 2022-09-15 DIAGNOSIS — F902 Attention-deficit hyperactivity disorder, combined type: Secondary | ICD-10-CM

## 2022-09-15 MED ORDER — DIAZEPAM 10 MG PO TABS: 10.0000 mg | ORAL_TABLET | Freq: Every day | ORAL | 2 refills | Status: DC

## 2022-09-15 MED ORDER — AMPHETAMINE-DEXTROAMPHETAMINE 30 MG PO TABS: 30.0000 mg | ORAL_TABLET | Freq: Three times a day (TID) | ORAL | 0 refills | Status: DC

## 2022-09-15 MED ORDER — AMPHETAMINE-DEXTROAMPHETAMINE 30 MG PO TABS: 1.0000 | ORAL_TABLET | Freq: Three times a day (TID) | ORAL | 0 refills | Status: DC

## 2022-09-15 NOTE — Progress Notes (Signed)
Virtual Visit via Telephone Note  I connected with Todd Gilbert on 09/15/22 at 11:20 AM EST by telephone and verified that I am speaking with the correct person using two identifiers.  Location: Patient: home Provider: office   I discussed the limitations, risks, security and privacy concerns of performing an evaluation and management service by telephone and the availability of in person appointments. I also discussed with the patient that there may be a patient responsible charge related to this service. The patient expressed understanding and agreed to proceed.     I discussed the assessment and treatment plan with the patient. The patient was provided an opportunity to ask questions and all were answered. The patient agreed with the plan and demonstrated an understanding of the instructions.   The patient was advised to call back or seek an in-person evaluation if the symptoms worsen or if the condition fails to improve as anticipated.  I provided 15 minutes of non-face-to-face time during this encounter.   [name], MD  [facility] MD/PA/NP OP Progress Note  09/15/2022 11:35 AM Todd Gilbert  MRN:  [id number]  Chief Complaint:  Chief Complaint  Patient presents with   ADHD   Anxiety   Follow-up   HPI: This patient is a 51 year old married white male who lives with his wife in [place].  He has 3 children who live in [place].  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  The patient returns for follow-up after 3 months regarding his ADHD and anxiety.  He states that he is doing well.  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  He states that he is focusing well with the Adderall and he denies any problems with anxiety and depression.  He is sleeping well with the Valium.  His health has been good Visit Diagnosis:    ICD-10-CM   1. ADHD (attention deficit hyperactivity  disorder), combined type  F90.2 amphetamine-dextroamphetamine (ADDERALL) 30 MG tablet    amphetamine-dextroamphetamine (ADDERALL) 30 MG tablet    amphetamine-dextroamphetamine (ADDERALL) 30 MG tablet      Past Psychiatric History: none  Past Medical History:  Past Medical History:  Diagnosis Date   ADHD (attention deficit hyperactivity disorder)    Anxiety    Bipolar disorder (HCC)    Depression    Obsessive-compulsive disorder    Psychosis (HCC)    Right sciatic nerve pain     Past Surgical History:  Procedure Laterality Date   HAND TENDON SURGERY  10/13/1996    Family Psychiatric History: See below  Family History:  Family History  Problem Relation Age of Onset   Alcohol abuse Father    Anxiety disorder Father    Dementia Maternal Grandmother    OCD Neg Hx    ADD / ADHD Neg Hx    Bipolar disorder Neg Hx    Depression Neg Hx    Drug abuse Neg Hx    Paranoid behavior Neg Hx    Schizophrenia Neg Hx    Seizures Neg Hx    Sexual abuse Neg Hx    Physical abuse Neg Hx     Social History:  Social History   Socioeconomic History   Marital status: Married    Spouse name: Not on file   Number of children: Not on file   Years of education: Not on file   Highest education level: Not on file  Occupational History   Not on file  Tobacco Use   Smoking status: Every Day    Types: Cigarettes    Last attempt to quit: 05/13/2012    Years since quitting: 10.3   Smokeless tobacco: Never   Tobacco comments:    quit a year ago  Substance and Sexual Activity   Alcohol use: No    Alcohol/week: 0.0 standard drinks of alcohol   Drug use: No   Sexual activity: Yes  Other Topics Concern   Not on file  Social History Narrative   Not on file   Social Determinants of Health   Financial Resource Strain: Not on file  Food Insecurity: Not on file  Transportation Needs: Not on file  Physical Activity: Not on file  Stress: Not on file  Social Connections: Not on file     Allergies:  Allergies  Allergen Reactions   Lithium Other (See Comments)    Bizarre behavior at night, climbing out the window and bringing mail into the bedroom   Tegretol [Carbamazepine] Other (See Comments)    Way out there VIVID dreams    Metabolic Disorder Labs: No results found for: "HGBA1C", "MPG" No results found for: "PROLACTIN" No results found for: "CHOL", "TRIG", "HDL", "CHOLHDL", "VLDL", "LDLCALC" No results found for: "TSH"  Therapeutic Level Labs: No results found for: "LITHIUM" No results found for: "VALPROATE" No results found for: "CBMZ"  Current Medications: Current Outpatient Medications  Medication Sig Dispense Refill   amphetamine-dextroamphetamine (ADDERALL) 30 MG tablet Take 1 tablet by mouth 3 (three) times daily. 90 tablet 0   amphetamine-dextroamphetamine (ADDERALL) 30 MG tablet Take 1 tablet by mouth 3 (three) times daily. 90 tablet 0   amphetamine-dextroamphetamine (ADDERALL) 30 MG tablet Take 1 tablet by mouth 3 (three) times daily. 90 tablet 0   diazepam (VALIUM) 10 MG tablet Take 1 tablet (10 mg total) by mouth at bedtime. 30 tablet 2   losartan (COZAAR) 50 MG tablet Take 50 mg by mouth daily.  2   No current facility-administered medications for this visit.     Musculoskeletal: Strength & Muscle Tone: na Gait & Station: na Patient leans: N/A  Psychiatric Specialty Exam: Review of Systems  All other systems reviewed and are negative.   There were no vitals taken for this visit.There is no height or weight on file to calculate BMI.  General Appearance: NA  Eye Contact:  NA  Speech:  Clear and Coherent  Volume:  Normal  Mood:  Euthymic  Affect:  NA  Thought Process:  Goal Directed  Orientation:  Full (Time, Place, and Person)  Thought Content: WDL   Suicidal Thoughts:  No  Homicidal Thoughts:  No  Memory:  Immediate;   Good Recent;   Good Remote;   Fair  Judgement:  Good  Insight:  Fair  Psychomotor Activity:  Normal   Concentration:  Concentration: Good and Attention Span: Good  Recall:  Good  Fund of Knowledge: Good  Language: Good  Akathisia:  No  Handed:  Right  AIMS (if indicated): not done  Assets:  Communication Skills Desire for Improvement Physical Health Resilience Social Support Talents/Skills  ADL's:  Intact  Cognition: WNL  Sleep:  Good   Screenings: Flowsheet Row ED from 02/03/2022 in [facility] DEPART[facility]  C-SSRS RISK CATEGORY No Risk        Assessment and Plan: This patient is a 51 year old male with a history of ADHD and anxiety.  He continues to do well on his  current regimen.  He will continue Adderall 30 mg 3 times daily for ADHD and Valium 10 mg at bedtime as needed for sleep and anxiety.  He will return to see me in 3 months  Collaboration of Care: Collaboration of Care: Primary Care Provider AEB notes will be shared with PCP at patient's request  Patient/Guardian was advised Release of Information must be obtained prior to any record release in order to collaborate their care with an outside provider. Patient/Guardian was advised if they have not already done so to contact the registration department to sign all necessary forms in order for [place] to release information regarding their care.   Consent: Patient/Guardian gives verbal consent for treatment and assignment of benefits for services provided during this visit. Patient/Guardian expressed understanding and agreed to proceed.    [name], MD 09/15/2022, 11:35 AM

## 2022-10-13 DIAGNOSIS — W3400XA Accidental discharge from unspecified firearms or gun, initial encounter: Secondary | ICD-10-CM

## 2022-10-13 HISTORY — DX: Accidental discharge from unspecified firearms or gun, initial encounter: W34.00XA

## 2022-11-18 DIAGNOSIS — I959 Hypotension, unspecified: Secondary | ICD-10-CM | POA: Diagnosis not present

## 2022-11-18 DIAGNOSIS — D6959 Other secondary thrombocytopenia: Secondary | ICD-10-CM | POA: Diagnosis not present

## 2022-11-18 DIAGNOSIS — S72302B Unspecified fracture of shaft of left femur, initial encounter for open fracture type I or II: Secondary | ICD-10-CM | POA: Diagnosis not present

## 2022-11-18 DIAGNOSIS — S72402B Unspecified fracture of lower end of left femur, initial encounter for open fracture type I or II: Secondary | ICD-10-CM | POA: Diagnosis not present

## 2022-11-18 DIAGNOSIS — S81842A Puncture wound with foreign body, left lower leg, initial encounter: Secondary | ICD-10-CM | POA: Diagnosis not present

## 2022-11-18 DIAGNOSIS — T07XXXA Unspecified multiple injuries, initial encounter: Secondary | ICD-10-CM | POA: Diagnosis not present

## 2022-11-18 DIAGNOSIS — S72302A Unspecified fracture of shaft of left femur, initial encounter for closed fracture: Secondary | ICD-10-CM | POA: Diagnosis not present

## 2022-11-18 DIAGNOSIS — S5400XA Injury of ulnar nerve at forearm level, unspecified arm, initial encounter: Secondary | ICD-10-CM | POA: Diagnosis not present

## 2022-11-18 DIAGNOSIS — S72352F Displaced comminuted fracture of shaft of left femur, subsequent encounter for open fracture type IIIA, IIIB, or IIIC with routine healing: Secondary | ICD-10-CM | POA: Diagnosis not present

## 2022-11-18 DIAGNOSIS — S52354C Nondisplaced comminuted fracture of shaft of radius, right arm, initial encounter for open fracture type IIIA, IIIB, or IIIC: Secondary | ICD-10-CM | POA: Diagnosis not present

## 2022-11-18 DIAGNOSIS — F319 Bipolar disorder, unspecified: Secondary | ICD-10-CM | POA: Diagnosis not present

## 2022-11-18 DIAGNOSIS — S75191A Other specified injury of femoral vein at hip and thigh level, right leg, initial encounter: Secondary | ICD-10-CM | POA: Diagnosis not present

## 2022-11-18 DIAGNOSIS — M778 Other enthesopathies, not elsewhere classified: Secondary | ICD-10-CM | POA: Diagnosis not present

## 2022-11-18 DIAGNOSIS — S75011A Minor laceration of femoral artery, right leg, initial encounter: Secondary | ICD-10-CM | POA: Diagnosis not present

## 2022-11-18 DIAGNOSIS — D62 Acute posthemorrhagic anemia: Secondary | ICD-10-CM | POA: Diagnosis not present

## 2022-11-18 DIAGNOSIS — S51831A Puncture wound without foreign body of right forearm, initial encounter: Secondary | ICD-10-CM | POA: Diagnosis not present

## 2022-11-18 DIAGNOSIS — F10139 Alcohol abuse with withdrawal, unspecified: Secondary | ICD-10-CM | POA: Diagnosis not present

## 2022-11-18 DIAGNOSIS — M25462 Effusion, left knee: Secondary | ICD-10-CM | POA: Diagnosis not present

## 2022-11-18 DIAGNOSIS — R102 Pelvic and perineal pain: Secondary | ICD-10-CM | POA: Diagnosis not present

## 2022-11-18 DIAGNOSIS — K5900 Constipation, unspecified: Secondary | ICD-10-CM | POA: Diagnosis not present

## 2022-11-18 DIAGNOSIS — F17210 Nicotine dependence, cigarettes, uncomplicated: Secondary | ICD-10-CM | POA: Diagnosis not present

## 2022-11-18 DIAGNOSIS — S61541A Puncture wound with foreign body of right wrist, initial encounter: Secondary | ICD-10-CM | POA: Diagnosis not present

## 2022-11-18 DIAGNOSIS — S72322B Displaced transverse fracture of shaft of left femur, initial encounter for open fracture type I or II: Secondary | ICD-10-CM | POA: Diagnosis not present

## 2022-11-18 DIAGNOSIS — S75101A Unspecified injury of femoral vein at hip and thigh level, right leg, initial encounter: Secondary | ICD-10-CM | POA: Diagnosis not present

## 2022-11-18 DIAGNOSIS — S52301B Unspecified fracture of shaft of right radius, initial encounter for open fracture type I or II: Secondary | ICD-10-CM | POA: Diagnosis not present

## 2022-11-18 DIAGNOSIS — S72352B Displaced comminuted fracture of shaft of left femur, initial encounter for open fracture type I or II: Secondary | ICD-10-CM | POA: Diagnosis not present

## 2022-11-18 DIAGNOSIS — R000 Tachycardia, unspecified: Secondary | ICD-10-CM | POA: Diagnosis not present

## 2022-11-18 DIAGNOSIS — S71132A Puncture wound without foreign body, left thigh, initial encounter: Secondary | ICD-10-CM | POA: Diagnosis not present

## 2022-11-18 DIAGNOSIS — S55011A Laceration of ulnar artery at forearm level, right arm, initial encounter: Secondary | ICD-10-CM | POA: Diagnosis not present

## 2022-11-18 DIAGNOSIS — S7291XA Unspecified fracture of right femur, initial encounter for closed fracture: Secondary | ICD-10-CM | POA: Diagnosis not present

## 2022-11-18 DIAGNOSIS — S52334B Nondisplaced oblique fracture of shaft of right radius, initial encounter for open fracture type I or II: Secondary | ICD-10-CM | POA: Diagnosis not present

## 2022-11-18 DIAGNOSIS — S72352C Displaced comminuted fracture of shaft of left femur, initial encounter for open fracture type IIIA, IIIB, or IIIC: Secondary | ICD-10-CM | POA: Diagnosis not present

## 2022-11-18 DIAGNOSIS — W3400XA Accidental discharge from unspecified firearms or gun, initial encounter: Secondary | ICD-10-CM | POA: Diagnosis not present

## 2022-11-18 DIAGNOSIS — I10 Essential (primary) hypertension: Secondary | ICD-10-CM | POA: Diagnosis not present

## 2022-11-18 DIAGNOSIS — S59911A Unspecified injury of right forearm, initial encounter: Secondary | ICD-10-CM | POA: Diagnosis not present

## 2022-11-18 DIAGNOSIS — S75021A Major laceration of femoral artery, right leg, initial encounter: Secondary | ICD-10-CM | POA: Diagnosis not present

## 2022-11-18 DIAGNOSIS — S81841A Puncture wound with foreign body, right lower leg, initial encounter: Secondary | ICD-10-CM | POA: Diagnosis not present

## 2022-11-18 DIAGNOSIS — S71131A Puncture wound without foreign body, right thigh, initial encounter: Secondary | ICD-10-CM | POA: Diagnosis not present

## 2022-11-18 DIAGNOSIS — S51811A Laceration without foreign body of right forearm, initial encounter: Secondary | ICD-10-CM | POA: Diagnosis not present

## 2022-11-18 DIAGNOSIS — Y998 Other external cause status: Secondary | ICD-10-CM | POA: Diagnosis not present

## 2022-11-19 DIAGNOSIS — S72302B Unspecified fracture of shaft of left femur, initial encounter for open fracture type I or II: Secondary | ICD-10-CM | POA: Diagnosis not present

## 2022-11-19 DIAGNOSIS — S52301B Unspecified fracture of shaft of right radius, initial encounter for open fracture type I or II: Secondary | ICD-10-CM | POA: Diagnosis not present

## 2022-11-19 DIAGNOSIS — S51811A Laceration without foreign body of right forearm, initial encounter: Secondary | ICD-10-CM | POA: Diagnosis not present

## 2022-11-21 DIAGNOSIS — F10139 Alcohol abuse with withdrawal, unspecified: Secondary | ICD-10-CM | POA: Diagnosis not present

## 2022-12-02 DIAGNOSIS — S51801D Unspecified open wound of right forearm, subsequent encounter: Secondary | ICD-10-CM | POA: Diagnosis not present

## 2022-12-02 DIAGNOSIS — S51831D Puncture wound without foreign body of right forearm, subsequent encounter: Secondary | ICD-10-CM | POA: Diagnosis not present

## 2022-12-10 ENCOUNTER — Telehealth (HOSPITAL_COMMUNITY): Payer: Commercial Managed Care - PPO | Admitting: Psychiatry

## 2022-12-10 DIAGNOSIS — S72302E Unspecified fracture of shaft of left femur, subsequent encounter for open fracture type I or II with routine healing: Secondary | ICD-10-CM | POA: Diagnosis not present

## 2022-12-10 DIAGNOSIS — Z4802 Encounter for removal of sutures: Secondary | ICD-10-CM | POA: Diagnosis not present

## 2022-12-10 DIAGNOSIS — S72345E Nondisplaced spiral fracture of shaft of left femur, subsequent encounter for open fracture type I or II with routine healing: Secondary | ICD-10-CM | POA: Diagnosis not present

## 2022-12-10 DIAGNOSIS — S52301E Unspecified fracture of shaft of right radius, subsequent encounter for open fracture type I or II with routine healing: Secondary | ICD-10-CM | POA: Diagnosis not present

## 2022-12-11 ENCOUNTER — Encounter: Payer: Self-pay | Admitting: Radiology

## 2022-12-12 DIAGNOSIS — W3400XA Accidental discharge from unspecified firearms or gun, initial encounter: Secondary | ICD-10-CM | POA: Diagnosis not present

## 2022-12-12 DIAGNOSIS — S72352C Displaced comminuted fracture of shaft of left femur, initial encounter for open fracture type IIIA, IIIB, or IIIC: Secondary | ICD-10-CM | POA: Diagnosis not present

## 2022-12-12 DIAGNOSIS — S72352B Displaced comminuted fracture of shaft of left femur, initial encounter for open fracture type I or II: Secondary | ICD-10-CM | POA: Diagnosis not present

## 2022-12-16 ENCOUNTER — Encounter (HOSPITAL_COMMUNITY): Payer: Self-pay | Admitting: Psychiatry

## 2022-12-16 ENCOUNTER — Ambulatory Visit (INDEPENDENT_AMBULATORY_CARE_PROVIDER_SITE_OTHER): Payer: Commercial Managed Care - PPO | Admitting: Psychiatry

## 2022-12-16 DIAGNOSIS — F902 Attention-deficit hyperactivity disorder, combined type: Secondary | ICD-10-CM | POA: Diagnosis not present

## 2022-12-16 MED ORDER — DIAZEPAM 10 MG PO TABS: 10.0000 mg | ORAL_TABLET | Freq: Every day | ORAL | 2 refills | Status: DC

## 2022-12-16 MED ORDER — AMPHETAMINE-DEXTROAMPHETAMINE 30 MG PO TABS: 30.0000 mg | ORAL_TABLET | Freq: Three times a day (TID) | ORAL | 0 refills | Status: DC

## 2022-12-16 MED ORDER — LOSARTAN POTASSIUM 50 MG PO TABS: 50.0000 mg | ORAL_TABLET | Freq: Every day | ORAL | 2 refills | Status: DC

## 2022-12-16 MED ORDER — AMPHETAMINE-DEXTROAMPHETAMINE 30 MG PO TABS: 1.0000 | ORAL_TABLET | Freq: Three times a day (TID) | ORAL | 0 refills | Status: DC
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
## 2022-12-22 ENCOUNTER — Other Ambulatory Visit (HOSPITAL_COMMUNITY): Payer: Self-pay

## 2022-12-22 ENCOUNTER — Telehealth (HOSPITAL_COMMUNITY): Payer: Self-pay

## 2022-12-22 ENCOUNTER — Other Ambulatory Visit (HOSPITAL_COMMUNITY): Payer: Self-pay | Admitting: Psychiatry

## 2022-12-22 ENCOUNTER — Other Ambulatory Visit: Payer: Self-pay

## 2022-12-22 DIAGNOSIS — F902 Attention-deficit hyperactivity disorder, combined type: Secondary | ICD-10-CM

## 2022-12-22 MED ORDER — AMPHETAMINE-DEXTROAMPHETAMINE 30 MG PO TABS
1.0000 | ORAL_TABLET | Freq: Three times a day (TID) | ORAL | 0 refills | Status: DC
  Filled 2022-12-22 – 2023-02-18 (×2): qty 90, 30d supply, fill #0

## 2022-12-22 MED ORDER — DIAZEPAM 10 MG PO TABS
10.0000 mg | ORAL_TABLET | Freq: Every day | ORAL | 2 refills | Status: DC
  Filled 2022-12-22: qty 30, 30d supply, fill #0
  Filled 2023-01-21: qty 30, 30d supply, fill #1
  Filled 2023-02-18: qty 30, 30d supply, fill #2

## 2022-12-22 MED ORDER — AMPHETAMINE-DEXTROAMPHETAMINE 30 MG PO TABS
30.0000 mg | ORAL_TABLET | Freq: Three times a day (TID) | ORAL | 0 refills | Status: DC
  Filled 2022-12-22: qty 90, 30d supply, fill #0

## 2022-12-22 MED ORDER — AMPHETAMINE-DEXTROAMPHETAMINE 30 MG PO TABS
30.0000 mg | ORAL_TABLET | Freq: Three times a day (TID) | ORAL | 0 refills | Status: DC
  Filled 2022-12-22 – 2023-01-21 (×2): qty 90, 30d supply, fill #0

## 2022-12-22 NOTE — Telephone Encounter (Signed)
Pt called in stating that [facility] is charging him 149.00 and he cannot afford it. Pt is wanting his rx for amphetamine-dextroamphetamine (ADDERALL) 30 MG tablet and diazepam (VALIUM) 10 MG tablet  sent to [facility] states that he needs his medicine today. Please advise.

## 2022-12-22 NOTE — Telephone Encounter (Signed)
Spoke with pt advised medication has been sent to the pharmacy. Pt verbalized understanding. 

## 2022-12-23 ENCOUNTER — Ambulatory Visit: Payer: Commercial Managed Care - PPO | Admitting: Internal Medicine

## 2022-12-23 ENCOUNTER — Encounter: Payer: Self-pay | Admitting: Internal Medicine

## 2022-12-23 VITALS — BP 188/95 | HR 101 | Resp 16 | Ht 69.0 in | Wt 224.0 lb

## 2022-12-23 DIAGNOSIS — F902 Attention-deficit hyperactivity disorder, combined type: Secondary | ICD-10-CM

## 2022-12-23 DIAGNOSIS — F5105 Insomnia due to other mental disorder: Secondary | ICD-10-CM

## 2022-12-23 DIAGNOSIS — I10 Essential (primary) hypertension: Secondary | ICD-10-CM

## 2022-12-23 DIAGNOSIS — Z0001 Encounter for general adult medical examination with abnormal findings: Secondary | ICD-10-CM

## 2022-12-23 MED ORDER — AMLODIPINE BESYLATE 5 MG PO TABS: 5.0000 mg | ORAL_TABLET | Freq: Every day | ORAL | 0 refills | Status: DC

## 2022-12-23 MED ORDER — LOSARTAN POTASSIUM 100 MG PO TABS: 50.0000 mg | ORAL_TABLET | Freq: Every day | ORAL | 1 refills | Status: DC

## 2022-12-23 NOTE — Assessment & Plan Note (Signed)
Prescribed by psychiatry. Valium 10 mg nightly for sleep and anxiety.

## 2022-12-23 NOTE — Assessment & Plan Note (Signed)
Patient is 52 years old and not been following recently with a primary care physician.  Will obtain baseline labs today.  He is on follow-up in 6 weeks for blood pressure and can discuss vaccinations at this visit.
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
## 2022-12-23 NOTE — Assessment & Plan Note (Signed)
Currently on Adderall 30 mg 3 times daily.  He is following with Dr. Harrington Challenger, psychiatry.

## 2022-12-23 NOTE — Patient Instructions (Addendum)
Thank you, [name] for allowing [place] to provide your care today.   I have ordered the following labs for you:   Lab Orders         Lipid panel         VITAMIN D 25 Hydroxy (Vit-D Deficiency, Fractures)         CMP14+EGFR         TSH         CBC with Differential/Platelet         Hemoglobin A1c        Reminders: I will follow-up with results of your labs.  Start increased dose of  Losartan and new medication called amlodipine.  Follow-up in 6 weeks.  Take blood pressure at home for 1 to 2 weeks before your next visit.    Tamsen Snider, M.D.

## 2022-12-24 LAB — LIPID PANEL
Chol/HDL Ratio: 3.5 ratio (ref 0.0–5.0)
Cholesterol, Total: 152 mg/dL (ref 100–199)
HDL: 44 mg/dL (ref >39)
LDL Chol Calc (NIH): 88 mg/dL (ref 0–99)
Triglycerides: 110 mg/dL (ref 0–149)
VLDL Cholesterol Cal: 20 mg/dL (ref 5–40)

## 2022-12-24 LAB — CMP14+EGFR
ALT: 24 IU/L (ref 0–44)
AST: 19 IU/L (ref 0–40)
Albumin/Globulin Ratio: 2 (ref 1.2–2.2)
Albumin: 4.9 g/dL (ref 3.8–4.9)
Alkaline Phosphatase: 171 IU/L — ABNORMAL HIGH (ref 44–121)
BUN/Creatinine Ratio: 11 (ref 9–20)
BUN: 11 mg/dL (ref 6–24)
Bilirubin Total: 0.4 mg/dL (ref 0.0–1.2)
CO2: 20 mmol/L (ref 20–29)
Calcium: 10.2 mg/dL (ref 8.7–10.2)
Chloride: 98 mmol/L (ref 96–106)
Creatinine, Ser: 0.99 mg/dL (ref 0.76–1.27)
Globulin, Total: 2.5 g/dL (ref 1.5–4.5)
Glucose: 124 mg/dL — ABNORMAL HIGH (ref 70–99)
Potassium: 3.8 mmol/L (ref 3.5–5.2)
Sodium: 138 mmol/L (ref 134–144)
Total Protein: 7.4 g/dL (ref 6.0–8.5)
eGFR: 92 mL/min/{1.73_m2} (ref >59)

## 2022-12-24 LAB — CBC WITH DIFFERENTIAL/PLATELET
Basophils Absolute: 0.1 10*3/uL (ref 0.0–0.2)
Basos: 1 %
EOS (ABSOLUTE): 0.2 10*3/uL (ref 0.0–0.4)
Eos: 2 %
Hematocrit: 42.3 % (ref 37.5–51.0)
Hemoglobin: 13.6 g/dL (ref 13.0–17.7)
Immature Grans (Abs): 0 10*3/uL (ref 0.0–0.1)
Immature Granulocytes: 0 %
Lymphocytes Absolute: 1.8 10*3/uL (ref 0.7–3.1)
Lymphs: 18 %
MCH: 30.6 pg (ref 26.6–33.0)
MCHC: 32.2 g/dL (ref 31.5–35.7)
MCV: 95 fL (ref 79–97)
Monocytes Absolute: 1.1 10*3/uL — ABNORMAL HIGH (ref 0.1–0.9)
Monocytes: 11 %
Neutrophils Absolute: 6.8 10*3/uL (ref 1.4–7.0)
Neutrophils: 68 %
Platelets: 267 10*3/uL (ref 150–450)
RBC: 4.45 x10E6/uL (ref 4.14–5.80)
RDW: 13.7 % (ref 11.6–15.4)
WBC: 9.9 10*3/uL (ref 3.4–10.8)

## 2022-12-24 LAB — VITAMIN D 25 HYDROXY (VIT D DEFICIENCY, FRACTURES): Vit D, 25-Hydroxy: 23.8 ng/mL — ABNORMAL LOW (ref 30.0–100.0)

## 2022-12-24 LAB — TSH: TSH: 1.76 u[IU]/mL (ref 0.450–4.500)

## 2022-12-24 LAB — HEMOGLOBIN A1C
Est. average glucose Bld gHb Est-mCnc: 94 mg/dL
Hgb A1c MFr Bld: 4.9 % (ref 4.8–5.6)

## 2022-12-24 NOTE — Assessment & Plan Note (Signed)
Patient blood pressure 188/95 today.  Recently started on losartan 50 mg daily by psychiatry and referred to establish with PCP. Check BMP today Increase losartan to 100 mg daily Start amlodipine 5 mg daily Follow-up in 6 weeks and perform ambulatory blood pressure checks

## 2022-12-30 DIAGNOSIS — S52301D Unspecified fracture of shaft of right radius, subsequent encounter for closed fracture with routine healing: Secondary | ICD-10-CM | POA: Diagnosis not present

## 2022-12-30 DIAGNOSIS — S72302D Unspecified fracture of shaft of left femur, subsequent encounter for closed fracture with routine healing: Secondary | ICD-10-CM | POA: Diagnosis not present

## 2022-12-30 DIAGNOSIS — S52501D Unspecified fracture of the lower end of right radius, subsequent encounter for closed fracture with routine healing: Secondary | ICD-10-CM | POA: Diagnosis not present

## 2022-12-30 DIAGNOSIS — S52201D Unspecified fracture of shaft of right ulna, subsequent encounter for closed fracture with routine healing: Secondary | ICD-10-CM | POA: Diagnosis not present

## 2023-01-14 ENCOUNTER — Other Ambulatory Visit: Payer: Self-pay | Admitting: Internal Medicine

## 2023-01-14 DIAGNOSIS — I10 Essential (primary) hypertension: Secondary | ICD-10-CM

## 2023-01-19 ENCOUNTER — Other Ambulatory Visit (HOSPITAL_COMMUNITY): Payer: Self-pay

## 2023-01-21 ENCOUNTER — Other Ambulatory Visit: Payer: Self-pay

## 2023-01-21 ENCOUNTER — Other Ambulatory Visit (HOSPITAL_COMMUNITY): Payer: Self-pay

## 2023-02-03 ENCOUNTER — Other Ambulatory Visit: Payer: Self-pay

## 2023-02-03 ENCOUNTER — Encounter: Payer: Self-pay | Admitting: Internal Medicine

## 2023-02-03 ENCOUNTER — Telehealth: Payer: Self-pay | Admitting: Internal Medicine

## 2023-02-03 ENCOUNTER — Encounter: Payer: Self-pay | Admitting: *Deleted

## 2023-02-03 ENCOUNTER — Ambulatory Visit (INDEPENDENT_AMBULATORY_CARE_PROVIDER_SITE_OTHER): Payer: Commercial Managed Care - PPO | Admitting: Internal Medicine

## 2023-02-03 VITALS — BP 152/85 | HR 77 | Ht 69.0 in | Wt 224.2 lb

## 2023-02-03 DIAGNOSIS — G629 Polyneuropathy, unspecified: Secondary | ICD-10-CM | POA: Diagnosis not present

## 2023-02-03 DIAGNOSIS — Z1211 Encounter for screening for malignant neoplasm of colon: Secondary | ICD-10-CM | POA: Diagnosis not present

## 2023-02-03 DIAGNOSIS — E559 Vitamin D deficiency, unspecified: Secondary | ICD-10-CM | POA: Diagnosis not present

## 2023-02-03 DIAGNOSIS — I10 Essential (primary) hypertension: Secondary | ICD-10-CM | POA: Diagnosis not present

## 2023-02-03 DIAGNOSIS — Z720 Tobacco use: Secondary | ICD-10-CM

## 2023-02-03 DIAGNOSIS — R748 Abnormal levels of other serum enzymes: Secondary | ICD-10-CM

## 2023-02-03 DIAGNOSIS — Z23 Encounter for immunization: Secondary | ICD-10-CM

## 2023-02-03 MED ORDER — AMLODIPINE BESYLATE 10 MG PO TABS: 10.0000 mg | ORAL_TABLET | Freq: Every day | ORAL | 2 refills | Status: DC

## 2023-02-03 MED ORDER — AMLODIPINE BESYLATE 10 MG PO TABS
10.0000 mg | ORAL_TABLET | Freq: Every day | ORAL | 2 refills | Status: DC
Start: 2023-02-03 — End: 2023-08-10

## 2023-02-03 NOTE — Patient Instructions (Signed)
It was a pleasure to see you today.  Thank you for giving [place] the opportunity to be involved in your care.  Below is a brief recap of your visit and next steps.  We will plan to see you again in 1 month.  Summary Increase amlodipine to 10 mg daily I have placed referrals to neurology and gastroenterology for neuropathy and screening colonoscopy respectively. Repeat labs to follow up on elevated alkaline phosphatase level Tetanus shot administered today I recommend starting daily vitamin D supplementation (1000 IU) Follow up in 3 months
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
## 2023-02-03 NOTE — Telephone Encounter (Signed)
When pt was checking out he mentioned his meds needed to be sent to [facility] here in [place].
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
## 2023-02-03 NOTE — Telephone Encounter (Signed)
Meds sent

## 2023-02-04 ENCOUNTER — Ambulatory Visit (HOSPITAL_COMMUNITY): Payer: Commercial Managed Care - PPO | Attending: General Practice

## 2023-02-04 ENCOUNTER — Other Ambulatory Visit: Payer: Self-pay

## 2023-02-04 DIAGNOSIS — S72302D Unspecified fracture of shaft of left femur, subsequent encounter for closed fracture with routine healing: Secondary | ICD-10-CM | POA: Diagnosis not present

## 2023-02-04 DIAGNOSIS — R262 Difficulty in walking, not elsewhere classified: Secondary | ICD-10-CM | POA: Diagnosis not present

## 2023-02-04 LAB — CMP14+EGFR
ALT: 22 IU/L (ref 0–44)
AST: 17 IU/L (ref 0–40)
Albumin/Globulin Ratio: 1.7 (ref 1.2–2.2)
Albumin: 4.4 g/dL (ref 3.8–4.9)
Alkaline Phosphatase: 174 IU/L — ABNORMAL HIGH (ref 44–121)
BUN/Creatinine Ratio: 13 (ref 9–20)
BUN: 12 mg/dL (ref 6–24)
Bilirubin Total: 0.2 mg/dL (ref 0.0–1.2)
CO2: 20 mmol/L (ref 20–29)
Calcium: 9.3 mg/dL (ref 8.7–10.2)
Chloride: 104 mmol/L (ref 96–106)
Creatinine, Ser: 0.9 mg/dL (ref 0.76–1.27)
Globulin, Total: 2.6 g/dL (ref 1.5–4.5)
Glucose: 89 mg/dL (ref 70–99)
Potassium: 4.3 mmol/L (ref 3.5–5.2)
Sodium: 141 mmol/L (ref 134–144)
Total Protein: 7 g/dL (ref 6.0–8.5)
eGFR: 103 mL/min/1.73

## 2023-02-04 LAB — GAMMA GT: GGT: 48 IU/L (ref 0–65)

## 2023-02-09 ENCOUNTER — Encounter: Payer: Self-pay | Admitting: Internal Medicine

## 2023-02-09 ENCOUNTER — Ambulatory Visit (HOSPITAL_COMMUNITY): Payer: Commercial Managed Care - PPO

## 2023-02-09 DIAGNOSIS — S72302D Unspecified fracture of shaft of left femur, subsequent encounter for closed fracture with routine healing: Secondary | ICD-10-CM | POA: Diagnosis not present

## 2023-02-09 DIAGNOSIS — Z23 Encounter for immunization: Secondary | ICD-10-CM | POA: Insufficient documentation

## 2023-02-09 DIAGNOSIS — R262 Difficulty in walking, not elsewhere classified: Secondary | ICD-10-CM

## 2023-02-09 DIAGNOSIS — Z1211 Encounter for screening for malignant neoplasm of colon: Secondary | ICD-10-CM | POA: Insufficient documentation

## 2023-02-09 DIAGNOSIS — E559 Vitamin D deficiency, unspecified: Secondary | ICD-10-CM | POA: Insufficient documentation

## 2023-02-09 DIAGNOSIS — R748 Abnormal levels of other serum enzymes: Secondary | ICD-10-CM | POA: Insufficient documentation

## 2023-02-09 DIAGNOSIS — G629 Polyneuropathy, unspecified: Secondary | ICD-10-CM | POA: Insufficient documentation

## 2023-02-09 DIAGNOSIS — Z720 Tobacco use: Secondary | ICD-10-CM | POA: Insufficient documentation

## 2023-02-09 NOTE — Assessment & Plan Note (Signed)
He currently smokes 1 pack/day of cigarettes and has accumulated at least a 35-pack-year smoking history.  He is precontemplative with regard to cessation currently but is agreeable to referral for lung cancer screening. -Referral for lung cancer screening placed today
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
## 2023-02-09 NOTE — Assessment & Plan Note (Signed)
Noted on recent labs.  Repeat CMP and GGT ordered today.

## 2023-02-09 NOTE — Assessment & Plan Note (Signed)
Gastroenterology referral placed today for screening colonoscopy. 
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
## 2023-02-09 NOTE — Assessment & Plan Note (Signed)
Presenting today for HTN follow-up.  Losartan was increased to 100 mg daily at his last appointment and amlodipine 5 mg daily was started.  BP has improved but remains mildly elevated, 152/85 initially and 140/78 on repeat. -Increase amlodipine to 10 mg daily -Follow-up in 3 months for reassessment

## 2023-02-09 NOTE — Assessment & Plan Note (Addendum)
-   Tdap vaccine administered today.

## 2023-02-09 NOTE — Assessment & Plan Note (Addendum)
Noted on recent labs.  Daily vitamin D supplementation recommended. 

## 2023-02-11 ENCOUNTER — Ambulatory Visit (HOSPITAL_COMMUNITY): Payer: Commercial Managed Care - PPO | Attending: General Practice

## 2023-02-11 DIAGNOSIS — S72302D Unspecified fracture of shaft of left femur, subsequent encounter for closed fracture with routine healing: Secondary | ICD-10-CM | POA: Diagnosis not present

## 2023-02-11 DIAGNOSIS — M25531 Pain in right wrist: Secondary | ICD-10-CM | POA: Insufficient documentation

## 2023-02-11 DIAGNOSIS — M25631 Stiffness of right wrist, not elsewhere classified: Secondary | ICD-10-CM | POA: Insufficient documentation

## 2023-02-11 DIAGNOSIS — R262 Difficulty in walking, not elsewhere classified: Secondary | ICD-10-CM | POA: Insufficient documentation

## 2023-02-11 DIAGNOSIS — R29898 Other symptoms and signs involving the musculoskeletal system: Secondary | ICD-10-CM | POA: Diagnosis not present

## 2023-02-11 DIAGNOSIS — Z419 Encounter for procedure for purposes other than remedying health state, unspecified: Secondary | ICD-10-CM | POA: Diagnosis not present
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
## 2023-02-17 ENCOUNTER — Encounter (HOSPITAL_COMMUNITY): Payer: Commercial Managed Care - PPO | Admitting: Physical Therapy

## 2023-02-18 ENCOUNTER — Other Ambulatory Visit (HOSPITAL_COMMUNITY): Payer: Self-pay

## 2023-02-19 ENCOUNTER — Encounter (HOSPITAL_COMMUNITY): Payer: Commercial Managed Care - PPO

## 2023-02-19 DIAGNOSIS — S72302D Unspecified fracture of shaft of left femur, subsequent encounter for closed fracture with routine healing: Secondary | ICD-10-CM | POA: Diagnosis not present

## 2023-02-19 DIAGNOSIS — S52501D Unspecified fracture of the lower end of right radius, subsequent encounter for closed fracture with routine healing: Secondary | ICD-10-CM | POA: Diagnosis not present

## 2023-02-19 DIAGNOSIS — S52301D Unspecified fracture of shaft of right radius, subsequent encounter for closed fracture with routine healing: Secondary | ICD-10-CM | POA: Diagnosis not present

## 2023-02-23 ENCOUNTER — Ambulatory Visit (HOSPITAL_COMMUNITY): Payer: Commercial Managed Care - PPO

## 2023-02-23 DIAGNOSIS — R29898 Other symptoms and signs involving the musculoskeletal system: Secondary | ICD-10-CM | POA: Diagnosis not present

## 2023-02-23 DIAGNOSIS — R262 Difficulty in walking, not elsewhere classified: Secondary | ICD-10-CM | POA: Diagnosis not present

## 2023-02-23 DIAGNOSIS — M25531 Pain in right wrist: Secondary | ICD-10-CM | POA: Diagnosis not present

## 2023-02-23 DIAGNOSIS — S72302D Unspecified fracture of shaft of left femur, subsequent encounter for closed fracture with routine healing: Secondary | ICD-10-CM | POA: Diagnosis not present

## 2023-02-23 DIAGNOSIS — M25631 Stiffness of right wrist, not elsewhere classified: Secondary | ICD-10-CM | POA: Diagnosis not present
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
## 2023-02-25 ENCOUNTER — Ambulatory Visit (HOSPITAL_COMMUNITY): Payer: Commercial Managed Care - PPO | Admitting: Physical Therapy

## 2023-02-25 DIAGNOSIS — S72302D Unspecified fracture of shaft of left femur, subsequent encounter for closed fracture with routine healing: Secondary | ICD-10-CM | POA: Diagnosis not present

## 2023-02-25 DIAGNOSIS — M25531 Pain in right wrist: Secondary | ICD-10-CM | POA: Diagnosis not present

## 2023-02-25 DIAGNOSIS — R262 Difficulty in walking, not elsewhere classified: Secondary | ICD-10-CM

## 2023-02-25 DIAGNOSIS — M25631 Stiffness of right wrist, not elsewhere classified: Secondary | ICD-10-CM | POA: Diagnosis not present

## 2023-02-25 DIAGNOSIS — R29898 Other symptoms and signs involving the musculoskeletal system: Secondary | ICD-10-CM | POA: Diagnosis not present
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
## 2023-03-02 ENCOUNTER — Ambulatory Visit (HOSPITAL_COMMUNITY): Payer: Commercial Managed Care - PPO

## 2023-03-02 DIAGNOSIS — R262 Difficulty in walking, not elsewhere classified: Secondary | ICD-10-CM

## 2023-03-02 DIAGNOSIS — M25531 Pain in right wrist: Secondary | ICD-10-CM | POA: Diagnosis not present

## 2023-03-02 DIAGNOSIS — S72302D Unspecified fracture of shaft of left femur, subsequent encounter for closed fracture with routine healing: Secondary | ICD-10-CM | POA: Diagnosis not present

## 2023-03-02 DIAGNOSIS — R29898 Other symptoms and signs involving the musculoskeletal system: Secondary | ICD-10-CM | POA: Diagnosis not present

## 2023-03-02 DIAGNOSIS — M25631 Stiffness of right wrist, not elsewhere classified: Secondary | ICD-10-CM | POA: Diagnosis not present
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
## 2023-03-04 ENCOUNTER — Ambulatory Visit (HOSPITAL_COMMUNITY): Payer: Commercial Managed Care - PPO

## 2023-03-04 DIAGNOSIS — M25631 Stiffness of right wrist, not elsewhere classified: Secondary | ICD-10-CM | POA: Diagnosis not present

## 2023-03-04 DIAGNOSIS — S72302D Unspecified fracture of shaft of left femur, subsequent encounter for closed fracture with routine healing: Secondary | ICD-10-CM

## 2023-03-04 DIAGNOSIS — R262 Difficulty in walking, not elsewhere classified: Secondary | ICD-10-CM

## 2023-03-04 DIAGNOSIS — M25531 Pain in right wrist: Secondary | ICD-10-CM | POA: Diagnosis not present

## 2023-03-04 DIAGNOSIS — R29898 Other symptoms and signs involving the musculoskeletal system: Secondary | ICD-10-CM | POA: Diagnosis not present
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
## 2023-03-05 NOTE — Progress Notes (Signed)
Referring Provider:  Billie Lade, MD Primary Care Physician:  Billie Lade, MD Primary Gastroenterologist:  Dr. Jena Gauss  Chief Complaint  Patient presents with   Colonoscopy    Colonoscopy screening     HPI:   Todd Gilbert is a 52 y.o. male presenting today at the request of  Durwin Nora Lucina Mellow, MD for consult colonoscopy.  Today:  Doing well overall.  No concerns for me today.  Denies abdominal pain, constipation, diarrhea, BRBPR, melena, unintentional weight loss, nausea, vomiting, reflux symptoms, dysphagia.  No family history of colon cancer.  Drinks about a sixpack a day. Denies any drug use in 20-30 years, but is very vague about his prior use.  States, "street drugs".  Denies IV or intranasal drug use.   Past Medical History:  Diagnosis Date   ADHD (attention deficit hyperactivity disorder)    Anxiety    Bipolar disorder (HCC)    Depression    HTN (hypertension)    Obsessive-compulsive disorder    Psychosis (HCC)    Right sciatic nerve pain     Past Surgical History:  Procedure Laterality Date   HAND TENDON SURGERY  10/13/1996    Current Outpatient Medications  Medication Sig Dispense Refill   acetaminophen (TYLENOL) 325 MG tablet Take by mouth.     amLODipine (NORVASC) 10 MG tablet Take 1 tablet (10 mg total) by mouth daily. 30 tablet 2   amphetamine-dextroamphetamine (ADDERALL) 30 MG tablet Take 1 tablet by mouth 3 (three) times daily. 90 tablet 0   amphetamine-dextroamphetamine (ADDERALL) 30 MG tablet Take 1 tablet by mouth 3 (three) times daily. 90 tablet 0   amphetamine-dextroamphetamine (ADDERALL) 30 MG tablet Take 1 tablet by mouth 3 (three) times daily. 90 tablet 0   diazepam (VALIUM) 10 MG tablet Take 1 tablet (10 mg total) by mouth at bedtime. 30 tablet 2   losartan (COZAAR) 100 MG tablet Take 0.5 tablets (50 mg total) by mouth daily. 90 tablet 1   No current facility-administered medications for this visit.    Allergies as of 03/06/2023  - Review Complete 03/06/2023  Allergen Reaction Noted   Lithium Other (See Comments) 01/03/2013   Tegretol [carbamazepine] Other (See Comments) 12/06/2012    Family History  Problem Relation Age of Onset   Alcohol abuse Father    Anxiety disorder Father    Dementia Maternal Grandmother    OCD Neg Hx    ADD / ADHD Neg Hx    Bipolar disorder Neg Hx    Depression Neg Hx    Drug abuse Neg Hx    Paranoid behavior Neg Hx    Schizophrenia Neg Hx    Seizures Neg Hx    Sexual abuse Neg Hx    Physical abuse Neg Hx    Colon cancer Neg Hx     Social History   Socioeconomic History   Marital status: Married    Spouse name: Not on file   Number of children: Not on file   Years of education: Not on file   Highest education level: Not on file  Occupational History   Not on file  Tobacco Use   Smoking status: Every Day    Packs/day: 0.50    Years: 35.00    Additional pack years: 0.00    Total pack years: 17.50    Types: Cigarettes    Last attempt to quit: 05/13/2012    Years since quitting: 10.8   Smokeless tobacco: Never   Tobacco comments:  quit a year ago  Substance and Sexual Activity   Alcohol use: Yes    Comment: 6 pack a day.   Drug use: Yes    Comment: 20-30 years ago - pain meds/street drugs.   Sexual activity: Yes  Other Topics Concern   Not on file  Social History Narrative   Not on file   Social Determinants of Health   Financial Resource Strain: Not on file  Food Insecurity: Not on file  Transportation Needs: Not on file  Physical Activity: Not on file  Stress: Not on file  Social Connections: Not on file  Intimate Partner Violence: Not on file    Review of Systems: Gen: Denies any fever, chills, cold or flulike symptoms, presyncope, syncope. CV: Denies chest pain, heart palpitations. Resp: Denies shortness of breath, cough. GI: See GU : Denies urinary burning, urinary frequency, urinary hesitancy MS: Denies joint pain. Derm: Denies rash. Psych:  Denies depression, anxiety. Heme: See HPI  Physical Exam: BP 136/84 (BP Location: Right Arm, Patient Position: Sitting, Cuff Size: Large)   Pulse 73   Temp 98.2 F (36.8 C) (Temporal)   Ht 5\' 8"  (1.727 m)   Wt 223 lb 12.8 oz (101.5 kg)   SpO2 98%   BMI 34.03 kg/m  General:   Alert and oriented. Pleasant and cooperative. Well-nourished and well-developed. Walking with a cain.   Head:  Normocephalic and atraumatic. Eyes:  Without icterus, sclera clear and conjunctiva pink.  Ears:  Normal auditory acuity. Lungs:  Clear to auscultation bilaterally. No wheezes, rales, or rhonchi. No distress.  Heart:  S1, S2 present without murmurs appreciated.  Abdomen:  +BS, soft, non-tender and non-distended. No HSM noted. No guarding or rebound. No masses appreciated.  Rectal:  Deferred  Msk:  Symmetrical without gross deformities. Normal posture. Extremities:  Without edema. Neurologic:  Alert and  oriented x4;  grossly normal neurologically. Skin:  Intact without significant lesions or rashes. Psych:  Normal mood and affect.    Assessment:  52 year old male with history of ADHD, anxiety, bipolar disorder, depression, OCD, psychosis, HTN, presenting today to discuss scheduling first-ever screening colonoscopy.  Denies any significant GI symptoms.  No alarm symptoms.  No family history of colon cancer.  He does drink about 6 pack of beer per day.  Reports history of substance abuse in the distant past, but is very vague about this.    Plan:  Proceed with colonoscopy with propofol by Dr. Jena Gauss in near future. The risks, benefits, and alternatives have been discussed with the patient in detail. The patient states understanding and desires to proceed.  ASA 3 Follow-up PRN   Marylyn Ishihara [facility] 03/06/2023

## 2023-03-06 ENCOUNTER — Ambulatory Visit (INDEPENDENT_AMBULATORY_CARE_PROVIDER_SITE_OTHER): Payer: Commercial Managed Care - PPO | Admitting: Gastroenterology

## 2023-03-06 ENCOUNTER — Encounter: Payer: Self-pay | Admitting: Gastroenterology

## 2023-03-06 VITALS — BP 136/84 | HR 73 | Temp 98.2°F | Ht 68.0 in | Wt 223.8 lb

## 2023-03-06 DIAGNOSIS — Z1211 Encounter for screening for malignant neoplasm of colon: Secondary | ICD-10-CM | POA: Diagnosis not present

## 2023-03-06 NOTE — Patient Instructions (Signed)
We will arrange to have a colonoscopy in the near future at [facility] with Dr. Jena Gauss.  Will follow-up with you in the office as needed.  Do not hesitate to call if you develop any new GI concerns.  Ermalinda Memos, PA-C [facility]

## 2023-03-11 ENCOUNTER — Ambulatory Visit (HOSPITAL_COMMUNITY): Payer: Commercial Managed Care - PPO | Admitting: Occupational Therapy

## 2023-03-11 DIAGNOSIS — M25531 Pain in right wrist: Secondary | ICD-10-CM

## 2023-03-11 DIAGNOSIS — S72302D Unspecified fracture of shaft of left femur, subsequent encounter for closed fracture with routine healing: Secondary | ICD-10-CM | POA: Diagnosis not present

## 2023-03-11 DIAGNOSIS — R262 Difficulty in walking, not elsewhere classified: Secondary | ICD-10-CM | POA: Diagnosis not present

## 2023-03-11 DIAGNOSIS — R29898 Other symptoms and signs involving the musculoskeletal system: Secondary | ICD-10-CM | POA: Diagnosis not present

## 2023-03-11 DIAGNOSIS — M25631 Stiffness of right wrist, not elsewhere classified: Secondary | ICD-10-CM
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
## 2023-03-12 ENCOUNTER — Encounter (HOSPITAL_COMMUNITY): Payer: Self-pay

## 2023-03-12 ENCOUNTER — Encounter (HOSPITAL_COMMUNITY): Payer: Commercial Managed Care - PPO

## 2023-03-12 NOTE — Therapy (Signed)
PHYSICAL THERAPY DISCHARGE SUMMARY  Visits from Start of Care: 7  Current functional level related to goals / functional outcomes: See latest progress note   Remaining deficits: See latest progress note   Education / Equipment: HEP   Patient agrees to discharge. Patient goals were partially met. Patient is being discharged due to the patient's request.  7:30 AM, 03/12/23 Todd Gilbert MPT [name] [place] [phone]

## 2023-03-13 ENCOUNTER — Encounter: Payer: Self-pay | Admitting: *Deleted

## 2023-03-14 DIAGNOSIS — Z419 Encounter for procedure for purposes other than remedying health state, unspecified: Secondary | ICD-10-CM | POA: Diagnosis not present

## 2023-03-16 ENCOUNTER — Encounter: Payer: Self-pay | Admitting: *Deleted

## 2023-03-17 ENCOUNTER — Encounter (HOSPITAL_COMMUNITY): Payer: Commercial Managed Care - PPO

## 2023-03-18 ENCOUNTER — Ambulatory Visit (HOSPITAL_COMMUNITY): Payer: Commercial Managed Care - PPO | Admitting: Psychiatry

## 2023-03-18 ENCOUNTER — Encounter (HOSPITAL_COMMUNITY): Payer: Self-pay

## 2023-03-18 ENCOUNTER — Other Ambulatory Visit (HOSPITAL_COMMUNITY): Payer: Self-pay

## 2023-03-18 ENCOUNTER — Telehealth (HOSPITAL_COMMUNITY): Payer: Self-pay

## 2023-03-18 DIAGNOSIS — F902 Attention-deficit hyperactivity disorder, combined type: Secondary | ICD-10-CM

## 2023-03-18 MED ORDER — AMPHETAMINE-DEXTROAMPHETAMINE 30 MG PO TABS
1.0000 | ORAL_TABLET | Freq: Three times a day (TID) | ORAL | 0 refills | Status: DC
Start: 2023-03-18 — End: 2023-04-02
  Filled 2023-03-18: qty 90, 30d supply, fill #0

## 2023-03-18 MED ORDER — DIAZEPAM 10 MG PO TABS
10.0000 mg | ORAL_TABLET | Freq: Every day | ORAL | 0 refills | Status: DC
  Filled 2023-03-18: qty 30, 30d supply, fill #0

## 2023-03-18 NOTE — Telephone Encounter (Signed)
Pt aware to check pharmacy

## 2023-03-18 NOTE — Telephone Encounter (Signed)
I send the prescription to [facility] pharmacy.  Please contact Dr. Deforest Hoyles who is covering today for Dr. Tenny Craw.  Thanks

## 2023-03-18 NOTE — Telephone Encounter (Signed)
Pt called back to r/s appt for today with Dr Tenny Craw. He is needing a refill on his amphetamine-dextroamphetamine (ADDERALL) 30 MG tablet and his diazepam (VALIUM) 10 MG tablet  sent to [facility]. Pt is rescheduled for 04/02/23 with Dr Tenny Craw. States that he will run out on 03/21/23. Please advise.

## 2023-03-19 ENCOUNTER — Encounter (HOSPITAL_COMMUNITY): Payer: Commercial Managed Care - PPO | Admitting: Physical Therapy

## 2023-03-24 ENCOUNTER — Encounter (HOSPITAL_COMMUNITY): Payer: Commercial Managed Care - PPO

## 2023-03-26 ENCOUNTER — Encounter (HOSPITAL_COMMUNITY): Payer: Commercial Managed Care - PPO | Admitting: Physical Therapy

## 2023-03-30 ENCOUNTER — Encounter (HOSPITAL_COMMUNITY): Payer: Commercial Managed Care - PPO | Admitting: Physical Therapy

## 2023-04-02 ENCOUNTER — Ambulatory Visit (INDEPENDENT_AMBULATORY_CARE_PROVIDER_SITE_OTHER): Payer: Commercial Managed Care - PPO | Admitting: Psychiatry

## 2023-04-02 ENCOUNTER — Other Ambulatory Visit (HOSPITAL_COMMUNITY): Payer: Self-pay

## 2023-04-02 ENCOUNTER — Encounter (HOSPITAL_COMMUNITY): Payer: Self-pay | Admitting: Psychiatry

## 2023-04-02 DIAGNOSIS — F902 Attention-deficit hyperactivity disorder, combined type: Secondary | ICD-10-CM

## 2023-04-02 MED ORDER — AMPHETAMINE-DEXTROAMPHETAMINE 30 MG PO TABS
1.0000 | ORAL_TABLET | Freq: Three times a day (TID) | ORAL | 0 refills | Status: DC
Start: 2023-04-02 — End: 2023-06-16
  Filled 2023-04-02 – 2023-04-15 (×2): qty 90, 30d supply, fill #0

## 2023-04-02 MED ORDER — AMPHETAMINE-DEXTROAMPHETAMINE 30 MG PO TABS
30.0000 mg | ORAL_TABLET | Freq: Three times a day (TID) | ORAL | 0 refills | Status: DC
  Filled 2023-04-02 – 2023-05-15 (×4): qty 90, 30d supply, fill #0

## 2023-04-02 MED ORDER — AMPHETAMINE-DEXTROAMPHETAMINE 30 MG PO TABS
30.0000 mg | ORAL_TABLET | Freq: Three times a day (TID) | ORAL | 0 refills | Status: DC
  Filled 2023-04-02 – 2023-05-15 (×2): qty 90, 30d supply, fill #0

## 2023-04-02 MED ORDER — DIAZEPAM 10 MG PO TABS
10.0000 mg | ORAL_TABLET | Freq: Every day | ORAL | 2 refills | Status: DC
  Filled 2023-04-02 – 2023-04-15 (×2): qty 30, 30d supply, fill #0
  Filled 2023-05-13 – 2023-05-15 (×2): qty 30, 30d supply, fill #1
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
## 2023-04-13 ENCOUNTER — Other Ambulatory Visit (HOSPITAL_COMMUNITY): Payer: Self-pay

## 2023-04-13 DIAGNOSIS — Z419 Encounter for procedure for purposes other than remedying health state, unspecified: Secondary | ICD-10-CM | POA: Diagnosis not present

## 2023-04-15 ENCOUNTER — Other Ambulatory Visit (HOSPITAL_COMMUNITY): Payer: Self-pay

## 2023-05-01 NOTE — Patient Instructions (Addendum)
Todd Gilbert  05/01/2023     @PREFPERIOPPHARMACY @   Your procedure is scheduled on  05/06/2023.   Report to [facility] at  0715  A.M.   Call this number if you have problems the morning of surgery:  [phone]  If you experience any cold or flu symptoms such as cough, fever, chills, shortness of breath, etc. between now and your scheduled surgery, please notify us at the above number.   Remember:  Follow the diet and prep instructions given to you by the office.    Take these medicines the morning of surgery with A SIP OF WATER                                     amlodipine.    Do not wear jewelry, make-up or nail polish, including gel polish,  artificial nails, or any other type of covering on natural nails (fingers and  toes).  Do not wear lotions, powders, or perfumes, or deodorant.  Do not shave 48 hours prior to surgery.  Men may shave face and neck.  Do not bring valuables to the hospital.  [facility] is not responsible for any belongings or valuables.  Contacts, dentures or bridgework may not be worn into surgery.  Leave your suitcase in the car.  After surgery it may be brought to your room.  For patients admitted to the hospital, discharge time will be determined by your treatment team.  Patients discharged the day of surgery will not be allowed to drive home and must have someone with them for 24 hours.    Special instructions:   DO NOT smoke tobacco or vape for 24 hours before your procedure.  Please read over the following fact sheets that you were given. Anesthesia Post-op Instructions and Care and Recovery After Surgery       Colonoscopy, Adult, Care After The following information offers guidance on how to care for yourself after your procedure. Your health care provider may also give you more specific instructions. If you have problems or questions, contact your health care provider. What can I expect after the procedure? After the  procedure, it is common to have: A small amount of blood in your stool for 24 hours after the procedure. Some gas. Mild cramping or bloating of your abdomen. Follow these instructions at home: Eating and drinking  Drink enough fluid to keep your urine pale yellow. Follow instructions from your health care provider about eating or drinking restrictions. Resume your normal diet as told by your health care provider. Avoid heavy or fried foods that are hard to digest. Activity Rest as told by your health care provider. Avoid sitting for a long time without moving. Get up to take short walks every 1-2 hours. This is important to improve blood flow and breathing. Ask for help if you feel weak or unsteady. Return to your normal activities as told by your health care provider. Ask your health care provider what activities are safe for you. Managing cramping and bloating  Try walking around when you have cramps or feel bloated. If directed, apply heat to your abdomen as told by your health care provider. Use the heat source that your health care provider recommends, such as a moist heat pack or a heating pad. Place a towel between your skin and the heat source. Leave the heat on for 20-30 minutes.  Remove the heat if your skin turns bright red. This is especially important if you are unable to feel pain, heat, or cold. You have a greater risk of getting burned. General instructions If you were given a sedative during the procedure, it can affect you for several hours. Do not drive or operate machinery until your health care provider says that it is safe. For the first 24 hours after the procedure: Do not sign important documents. Do not drink alcohol. Do your regular daily activities at a slower pace than normal. Eat soft foods that are easy to digest. Take over-the-counter and prescription medicines only as told by your health care provider. Keep all follow-up visits. This is important. Contact  a health care provider if: You have blood in your stool 2-3 days after the procedure. Get help right away if: You have more than a small spotting of blood in your stool. You have large blood clots in your stool. You have swelling of your abdomen. You have nausea or vomiting. You have a fever. You have increasing pain in your abdomen that is not relieved with medicine. These symptoms may be an emergency. Get help right away. Call 911. Do not wait to see if the symptoms will go away. Do not drive yourself to the hospital. Summary After the procedure, it is common to have a small amount of blood in your stool. You may also have mild cramping and bloating of your abdomen. If you were given a sedative during the procedure, it can affect you for several hours. Do not drive or operate machinery until your health care provider says that it is safe. Get help right away if you have a lot of blood in your stool, nausea or vomiting, a fever, or increased pain in your abdomen. This information is not intended to replace advice given to you by your health care provider. Make sure you discuss any questions you have with your health care provider. Document Revised: 11/11/2022 Document Reviewed: 05/22/2021 Elsevier Patient Education  2024 [employer] Monitored Anesthesia Care, Care After The following information offers guidance on how to care for yourself after your procedure. Your health care provider may also give you more specific instructions. If you have problems or questions, contact your health care provider. What can I expect after the procedure? After the procedure, it is common to have: Tiredness. Little or no memory about what happened during or after the procedure. Impaired judgment when it comes to making decisions. Nausea or vomiting. Some trouble with balance. Follow these instructions at home: For the time period you were told by your health care provider:  Rest. Do not participate  in activities where you could fall or become injured. Do not drive or use machinery. Do not drink alcohol. Do not take sleeping pills or medicines that cause drowsiness. Do not make important decisions or sign legal documents. Do not take care of children on your own. Medicines Take over-the-counter and prescription medicines only as told by your health care provider. If you were prescribed antibiotics, take them as told by your health care provider. Do not stop using the antibiotic even if you start to feel better. Eating and drinking Follow instructions from your health care provider about what you may eat and drink. Drink enough fluid to keep your urine pale yellow. If you vomit: Drink clear fluids slowly and in small amounts as you are able. Clear fluids include water, ice chips, low-calorie sports drinks, and fruit juice that has water  added to it (diluted fruit juice). Eat light and bland foods in small amounts as you are able. These foods include bananas, applesauce, rice, lean meats, toast, and crackers. General instructions  Have a responsible adult stay with you for the time you are told. It is important to have someone help care for you until you are awake and alert. If you have sleep apnea, surgery and some medicines can increase your risk for breathing problems. Follow instructions from your health care provider about wearing your sleep device: When you are sleeping. This includes during daytime naps. While taking prescription pain medicines, sleeping medicines, or medicines that make you drowsy. Do not use any products that contain nicotine or tobacco. These products include cigarettes, chewing tobacco, and vaping devices, such as e-cigarettes. If you need help quitting, ask your health care provider. Contact a health care provider if: You feel nauseous or vomit every time you eat or drink. You feel light-headed. You are still sleepy or having trouble with balance after 24  hours. You get a rash. You have a fever. You have redness or swelling around the IV site. Get help right away if: You have trouble breathing. You have new confusion after you get home. These symptoms may be an emergency. Get help right away. Call 911. Do not wait to see if the symptoms will go away. Do not drive yourself to the hospital. This information is not intended to replace advice given to you by your health care provider. Make sure you discuss any questions you have with your health care provider. Document Revised: 02/24/2022 Document Reviewed: 02/24/2022 Elsevier Patient Education  2024 [employer].

## 2023-05-04 ENCOUNTER — Encounter (HOSPITAL_COMMUNITY)
Admission: RE | Admit: 2023-05-04 | Discharge: 2023-05-04 | Disposition: A | Discharge status: Home / Self Care | Payer: Commercial Managed Care - PPO | Source: Ambulatory Visit | Attending: Internal Medicine | Admitting: Internal Medicine

## 2023-05-04 ENCOUNTER — Encounter (HOSPITAL_COMMUNITY): Payer: Self-pay

## 2023-05-04 VITALS — BP 150/78 | HR 58 | Temp 97.8°F | Resp 18 | Ht 68.0 in | Wt 222.4 lb

## 2023-05-04 DIAGNOSIS — Z01810 Encounter for preprocedural cardiovascular examination: Secondary | ICD-10-CM | POA: Diagnosis not present

## 2023-05-04 DIAGNOSIS — I10 Essential (primary) hypertension: Secondary | ICD-10-CM | POA: Insufficient documentation

## 2023-05-04 DIAGNOSIS — R9431 Abnormal electrocardiogram [ECG] [EKG]: Secondary | ICD-10-CM | POA: Insufficient documentation

## 2023-05-05 ENCOUNTER — Ambulatory Visit: Payer: Commercial Managed Care - PPO | Admitting: Diagnostic Neuroimaging

## 2023-05-05 ENCOUNTER — Ambulatory Visit: Payer: Commercial Managed Care - PPO | Admitting: Internal Medicine

## 2023-05-06 ENCOUNTER — Encounter (HOSPITAL_COMMUNITY): Payer: Self-pay | Admitting: Internal Medicine

## 2023-05-06 ENCOUNTER — Ambulatory Visit (HOSPITAL_BASED_OUTPATIENT_CLINIC_OR_DEPARTMENT_OTHER): Payer: Commercial Managed Care - PPO | Admitting: Certified Registered"

## 2023-05-06 ENCOUNTER — Ambulatory Visit (HOSPITAL_COMMUNITY): Payer: Commercial Managed Care - PPO | Admitting: Certified Registered"

## 2023-05-06 ENCOUNTER — Ambulatory Visit (HOSPITAL_COMMUNITY)
Admission: RE | Admit: 2023-05-06 | Discharge: 2023-05-06 | Disposition: A | Discharge status: Home / Self Care | Payer: Commercial Managed Care - PPO | Attending: Internal Medicine | Admitting: Internal Medicine

## 2023-05-06 ENCOUNTER — Encounter (HOSPITAL_COMMUNITY)
Admission: RE | Disposition: A | Discharge status: Home / Self Care | Payer: Self-pay | Source: Home / Self Care | Attending: Internal Medicine

## 2023-05-06 DIAGNOSIS — D124 Benign neoplasm of descending colon: Secondary | ICD-10-CM | POA: Insufficient documentation

## 2023-05-06 DIAGNOSIS — Z1211 Encounter for screening for malignant neoplasm of colon: Secondary | ICD-10-CM

## 2023-05-06 DIAGNOSIS — K635 Polyp of colon: Secondary | ICD-10-CM | POA: Diagnosis not present

## 2023-05-06 DIAGNOSIS — F17210 Nicotine dependence, cigarettes, uncomplicated: Secondary | ICD-10-CM | POA: Insufficient documentation

## 2023-05-06 DIAGNOSIS — F319 Bipolar disorder, unspecified: Secondary | ICD-10-CM | POA: Insufficient documentation

## 2023-05-06 DIAGNOSIS — I10 Essential (primary) hypertension: Secondary | ICD-10-CM | POA: Diagnosis not present

## 2023-05-06 DIAGNOSIS — F419 Anxiety disorder, unspecified: Secondary | ICD-10-CM | POA: Insufficient documentation

## 2023-05-06 DIAGNOSIS — D125 Benign neoplasm of sigmoid colon: Secondary | ICD-10-CM

## 2023-05-06 DIAGNOSIS — D120 Benign neoplasm of cecum: Secondary | ICD-10-CM

## 2023-05-06 DIAGNOSIS — D122 Benign neoplasm of ascending colon: Secondary | ICD-10-CM

## 2023-05-06 DIAGNOSIS — Z139 Encounter for screening, unspecified: Secondary | ICD-10-CM | POA: Diagnosis not present

## 2023-05-06 HISTORY — PX: POLYPECTOMY: SHX5525

## 2023-05-06 HISTORY — PX: ENDOSCOPIC MUCOSAL RESECTION: SHX6839

## 2023-05-06 HISTORY — PX: SUBMUCOSAL LIFTING INJECTION: SHX6855

## 2023-05-06 HISTORY — PX: COLONOSCOPY WITH PROPOFOL: SHX5780

## 2023-05-06 SURGERY — COLONOSCOPY WITH PROPOFOL
Anesthesia: General

## 2023-05-06 MED ORDER — GLUCAGON HCL RDNA (DIAGNOSTIC) 1 MG IJ SOLR
INTRAMUSCULAR | Status: AC
  Filled 2023-05-06: qty 1

## 2023-05-06 MED ORDER — GLYCOPYRROLATE PF 0.2 MG/ML IJ SOSY
PREFILLED_SYRINGE | INTRAMUSCULAR | Status: DC | PRN
  Administered 2023-05-06: .2 mg via INTRAVENOUS

## 2023-05-06 MED ORDER — DEXMEDETOMIDINE HCL IN NACL 80 MCG/20ML IV SOLN
INTRAVENOUS | Status: DC | PRN
  Administered 2023-05-06 (×4): 8 ug via INTRAVENOUS

## 2023-05-06 MED ORDER — EPHEDRINE 5 MG/ML INJ
INTRAVENOUS | Status: AC
  Filled 2023-05-06: qty 5

## 2023-05-06 MED ORDER — EPHEDRINE SULFATE-NACL 50-0.9 MG/10ML-% IV SOSY
PREFILLED_SYRINGE | INTRAVENOUS | Status: DC | PRN
  Administered 2023-05-06: 10 mg via INTRAVENOUS

## 2023-05-06 MED ORDER — STERILE WATER FOR IRRIGATION IR SOLN
Status: DC | PRN
  Administered 2023-05-06: 100 mL

## 2023-05-06 MED ORDER — SPOT INK MARKER SYRINGE KIT
PACK | SUBMUCOSAL | Status: DC | PRN
  Administered 2023-05-06: 1.25 mL via SUBMUCOSAL

## 2023-05-06 MED ORDER — SPOT INK MARKER SYRINGE KIT
PACK | SUBMUCOSAL | Status: AC
  Filled 2023-05-06: qty 5

## 2023-05-06 MED ORDER — LACTATED RINGERS IV SOLN: INTRAVENOUS | Status: DC

## 2023-05-06 MED ORDER — ONDANSETRON HCL 4 MG/2ML IJ SOLN
INTRAMUSCULAR | Status: DC | PRN
  Administered 2023-05-06: 4 mg via INTRAVENOUS

## 2023-05-06 MED ORDER — PHENYLEPHRINE HCL (PRESSORS) 10 MG/ML IV SOLN
INTRAVENOUS | Status: DC | PRN
  Administered 2023-05-06 (×4): 160 ug via INTRAVENOUS

## 2023-05-06 MED ORDER — PROPOFOL 10 MG/ML IV BOLUS
INTRAVENOUS | Status: DC | PRN
  Administered 2023-05-06: 30 mg via INTRAVENOUS
  Administered 2023-05-06: 70 mg via INTRAVENOUS
  Administered 2023-05-06: 30 mg via INTRAVENOUS

## 2023-05-06 MED ORDER — GLYCOPYRROLATE PF 0.2 MG/ML IJ SOSY
PREFILLED_SYRINGE | INTRAMUSCULAR | Status: AC
  Filled 2023-05-06: qty 1

## 2023-05-06 MED ORDER — PROPOFOL 1000 MG/100ML IV EMUL
INTRAVENOUS | Status: AC
  Filled 2023-05-06: qty 100

## 2023-05-06 MED ORDER — MIDAZOLAM HCL 2 MG/2ML IJ SOLN
INTRAMUSCULAR | Status: AC
  Filled 2023-05-06: qty 2

## 2023-05-06 MED ORDER — PROPOFOL 500 MG/50ML IV EMUL
INTRAVENOUS | Status: DC | PRN
  Administered 2023-05-06: 200 ug/kg/min via INTRAVENOUS

## 2023-05-06 MED ORDER — LIDOCAINE HCL (PF) 2 % IJ SOLN
INTRAMUSCULAR | Status: AC
  Filled 2023-05-06: qty 5

## 2023-05-06 MED ORDER — ONDANSETRON HCL 4 MG/2ML IJ SOLN
INTRAMUSCULAR | Status: AC
  Filled 2023-05-06: qty 2

## 2023-05-06 MED ORDER — GLUCAGON HCL RDNA (DIAGNOSTIC) 1 MG IJ SOLR
INTRAMUSCULAR | Status: DC | PRN
  Administered 2023-05-06: 1 mg via INTRAVENOUS

## 2023-05-06 MED ORDER — MIDAZOLAM HCL 5 MG/5ML IJ SOLN
INTRAMUSCULAR | Status: DC | PRN
  Administered 2023-05-06: 2 mg via INTRAVENOUS

## 2023-05-06 MED ORDER — DEXMEDETOMIDINE HCL IN NACL 80 MCG/20ML IV SOLN
INTRAVENOUS | Status: AC
  Filled 2023-05-06: qty 20

## 2023-05-06 MED ORDER — PHENYLEPHRINE 80 MCG/ML (10ML) SYRINGE FOR IV PUSH (FOR BLOOD PRESSURE SUPPORT)
PREFILLED_SYRINGE | INTRAVENOUS | Status: AC
  Filled 2023-05-06: qty 10

## 2023-05-06 NOTE — Discharge Instructions (Signed)
  Colonoscopy Discharge Instructions  Read the instructions outlined below and refer to this sheet in the next few weeks. These discharge instructions provide you with general information on caring for yourself after you leave the hospital. Your doctor may also give you specific instructions. While your treatment has been planned according to the most current medical practices available, unavoidable complications occasionally occur. If you have any problems or questions after discharge, call Dr. Jena Gauss at [phone]. ACTIVITY You may resume your regular activity, but move at a slower pace for the next 24 hours.  Take frequent rest periods for the next 24 hours.  Walking will help get rid of the air and reduce the bloated feeling in your belly (abdomen).  No driving for 24 hours (because of the medicine (anesthesia) used during the test).   Do not sign any important legal documents or operate any machinery for 24 hours (because of the anesthesia used during the test).  NUTRITION Drink plenty of fluids.  You may resume your normal diet as instructed by your doctor.  Begin with a light meal and progress to your normal diet. Heavy or fried foods are harder to digest and may make you feel sick to your stomach (nauseated).  Avoid alcoholic beverages for 24 hours or as instructed.  MEDICATIONS You may resume your normal medications unless your doctor tells you otherwise.  WHAT YOU CAN EXPECT TODAY Some feelings of bloating in the abdomen.  Passage of more gas than usual.  Spotting of blood in your stool or on the toilet paper.  IF YOU HAD POLYPS REMOVED DURING THE COLONOSCOPY: No aspirin products for 7 days or as instructed.  No alcohol for 7 days or as instructed.  Eat a soft diet for the next 24 hours.  FINDING OUT THE RESULTS OF YOUR TEST Not all test results are available during your visit. If your test results are not back during the visit, make an appointment with your caregiver to find out the  results. Do not assume everything is normal if you have not heard from your caregiver or the medical facility. It is important for you to follow up on all of your test results.  SEEK IMMEDIATE MEDICAL ATTENTION IF: You have more than a spotting of blood in your stool.  Your belly is swollen (abdominal distention).  You are nauseated or vomiting.  You have a temperature over 101.  You have abdominal pain or discomfort that is severe or gets worse throughout the day.     Numerous colonic polyps removed from your colon  Further recommendations to follow pending review of pathology report  At a minimum, you will need a repeat colonoscopy in 6 months  At patient request, I called Todd Gilbert at [phone]-reviewed findings and recommendations

## 2023-05-06 NOTE — Transfer of Care (Signed)
Immediate Anesthesia Transfer of Care Note  Patient: Todd Gilbert  Procedure(s) Performed: COLONOSCOPY WITH PROPOFOL POLYPECTOMY SUBMUCOSAL LIFTING INJECTION ENDOSCOPIC MUCOSAL RESECTION  Patient Location: PACU  Anesthesia Type:General  Level of Consciousness: sedated  Airway & Oxygen Therapy: Patient Spontanous Breathing  Post-op Assessment: Report given to RN and Post -op Vital signs reviewed and stable  Post vital signs: Reviewed and stable  Last Vitals:  Vitals Value Taken Time  BP 95/59 05/06/23 1042  Temp 36.2 C 05/06/23 1042  Pulse 70 05/06/23 1042  Resp 16 05/06/23 1042  SpO2 97 % 05/06/23 1042    Last Pain:  Vitals:   05/06/23 1042  TempSrc: Axillary  PainSc:          Complications: No notable events documented.
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
## 2023-05-06 NOTE — Op Note (Signed)
[facility] Patient Name: Todd Gilbert Procedure Date: 05/06/2023 9:12 AM MRN: [id number] Date of Birth: 06/02/1971 Attending MD: Gennette Pac , MD, [id number] CSN: [id number] Age: 52 Admit Type: Outpatient Procedure:                Colonoscopy Indications:              Screening for colorectal malignant neoplasm Providers:                Gennette Pac, MD, Buel Ream. Museum/gallery exhibitions officer, Charity fundraiser,                            Judeth Cornfield. Jessee Avers, Technician Referring MD:             Gennette Pac, MD Medicines:                Propofol per Anesthesia Complications:            No immediate complications. Estimated Blood Loss:     Estimated blood loss was minimal. Procedure:                Pre-Anesthesia Assessment:                           - Prior to the procedure, a History and Physical                            was performed, and patient medications and                            allergies were reviewed. The patient's tolerance of                            previous anesthesia was also reviewed. The risks                            and benefits of the procedure and the sedation                            options and risks were discussed with the patient.                            All questions were answered, and informed consent                            was obtained. Prior Anticoagulants: The patient has                            taken no anticoagulant or antiplatelet agents. ASA                            Grade Assessment: III - A patient with severe                            systemic disease. After reviewing the risks and  benefits, the patient was deemed in satisfactory                            condition to undergo the procedure.                           After obtaining informed consent, the colonoscope                            was passed under direct vision. Throughout the                            procedure, the patient's  blood pressure, pulse, and                            oxygen saturations were monitored continuously. The                            [id number]) scope was introduced through                            the anus and advanced to the the cecum, identified                            by appendiceal orifice and ileocecal valve. The                            colonoscopy was performed without difficulty. The                            patient tolerated the procedure well. The quality                            of the bowel preparation was adequate. The                            ileocecal valve, appendiceal orifice, and rectum                            were photographed. The entire colon was well                            visualized. Scope In: 9:37:03 AM Scope Out: 10:38:02 AM Scope Withdrawal Time: 0 hours 52 minutes 17 seconds  Total Procedure Duration: 1 hour 0 minutes 59 seconds  Findings:      The perianal and digital rectal examinations were normal. (2) large       serrated polyps found in the ascending segment 1.5 to 2 cm in       dimensions. Semipedunculated. Good lift with Todd Gilbert view (13 cc total).       Hot snare piecemeal polypectomy ensued. removing both polyps completely.       Polypectomy margins ablated with the tip of the hot snare loop. 1 cm       polyp in the base of the cecum (appendiceal orifice) -. Removed  cleanly       with hot snare. There were 5 additional polyps measuring from 6 to 9 mm       semipedunculated in descending and sigmoid segments they were removed       with cold snare technique.      The exam was otherwise without abnormality on direct and retroflexion       views. The 2 large a ascending colon polypectomy sites were demarcated       with ink as shown in the photographs. Impression:               - Multiple colonic polyps removed as described                            including 2 large polyps in the ascending segment -                             piecemeal removed. Polypectomy sitse tattooed the                            examination was otherwise normal on direct and                            retroflexion views.                           - Moderate Sedation:      Moderate (conscious) sedation was personally administered by an       anesthesia professional. The following parameters were monitored: oxygen       saturation, heart rate, blood pressure, respiratory rate, EKG, adequacy       of pulmonary ventilation, and response to care. Recommendation:           - Patient has a contact number available for                            emergencies. The signs and symptoms of potential                            delayed complications were discussed with the                            patient. Return to normal activities tomorrow.                            Written discharge instructions were provided to the                            patient.                           - Advance diet as tolerated.                           - Continue present medications.                           - Repeat  colonoscopy in 6 months for surveillance.                           - Return to GI office (date not yet determined). Procedure Code(s):        --- Professional ---                           [phone], Colonoscopy, flexible; diagnostic, including                            collection of specimen(s) by brushing or washing,                            when performed (separate procedure) Diagnosis Code(s):        --- Professional ---                           Z12.11, Encounter for screening for malignant                            neoplasm of colon CPT copyright 2022 [employer]. All rights reserved. The codes documented in this report are preliminary and upon coder review may  be revised to meet current compliance requirements. Todd Gilbert. Todd Pitter, MD Gennette Pac, MD 05/06/2023 10:55:03 AM This report has been signed  electronically. Number of Addenda: 0

## 2023-05-06 NOTE — Anesthesia Preprocedure Evaluation (Signed)
Anesthesia Evaluation  Patient identified by MRN, date of birth, ID band Patient awake    Reviewed: Allergy & Precautions, H&P , NPO status , Patient's Chart, lab work & pertinent test results, reviewed documented beta blocker date and time   Airway Mallampati: II  TM Distance: >3 FB Neck ROM: full    Dental no notable dental hx.    Pulmonary neg pulmonary ROS, Current Smoker and Patient abstained from smoking.   Pulmonary exam normal breath sounds clear to auscultation       Cardiovascular Exercise Tolerance: Good hypertension, negative cardio ROS  Rhythm:regular Rate:Normal     Neuro/Psych  PSYCHIATRIC DISORDERS Anxiety Depression Bipolar Disorder    Neuromuscular disease negative neurological ROS  negative psych ROS   GI/Hepatic negative GI ROS, Neg liver ROS,,,  Endo/Other  negative endocrine ROS    Renal/GU negative Renal ROS  negative genitourinary   Musculoskeletal   Abdominal   Peds  Hematology negative hematology ROS (+)   Anesthesia Other Findings   Reproductive/Obstetrics negative OB ROS                             Anesthesia Physical Anesthesia Plan  ASA: 2  Anesthesia Plan: General   Post-op Pain Management:    Induction:   PONV Risk Score and Plan: Propofol infusion  Airway Management Planned:   Additional Equipment:   Intra-op Plan:   Post-operative Plan:   Informed Consent: I have reviewed the patients History and Physical, chart, labs and discussed the procedure including the risks, benefits and alternatives for the proposed anesthesia with the patient or authorized representative who has indicated his/her understanding and acceptance.     Dental Advisory Given  Plan Discussed with: CRNA  Anesthesia Plan Comments:        Anesthesia Quick Evaluation

## 2023-05-07 ENCOUNTER — Encounter: Payer: Self-pay | Admitting: Internal Medicine

## 2023-05-07 LAB — SURGICAL PATHOLOGY

## 2023-05-07 NOTE — Anesthesia Postprocedure Evaluation (Signed)
Anesthesia Post Note  Patient: Todd Gilbert  Procedure(s) Performed: COLONOSCOPY WITH PROPOFOL POLYPECTOMY SUBMUCOSAL LIFTING INJECTION ENDOSCOPIC MUCOSAL RESECTION  Patient location during evaluation: Phase II Anesthesia Type: General Level of consciousness: awake Pain management: pain level controlled Vital Signs Assessment: post-procedure vital signs reviewed and stable Respiratory status: spontaneous breathing and respiratory function stable Cardiovascular status: blood pressure returned to baseline and stable Postop Assessment: no headache and no apparent nausea or vomiting Anesthetic complications: no Comments: Late entry   No notable events documented.   Last Vitals:  Vitals:   05/06/23 1045 05/06/23 1100  BP:  113/76  Pulse: 69 63  Resp: 16 19  Temp:    SpO2: 97% 99%    Last Pain:  Vitals:   05/06/23 1042  TempSrc: Axillary  PainSc: 0-No pain                 Windell Norfolk

## 2023-05-08 ENCOUNTER — Ambulatory Visit (INDEPENDENT_AMBULATORY_CARE_PROVIDER_SITE_OTHER): Payer: Commercial Managed Care - PPO | Admitting: Internal Medicine

## 2023-05-08 ENCOUNTER — Encounter: Payer: Self-pay | Admitting: Internal Medicine

## 2023-05-08 VITALS — BP 133/79 | HR 73 | Ht 68.0 in | Wt 226.8 lb

## 2023-05-08 DIAGNOSIS — F419 Anxiety disorder, unspecified: Secondary | ICD-10-CM | POA: Diagnosis not present

## 2023-05-08 DIAGNOSIS — Z720 Tobacco use: Secondary | ICD-10-CM

## 2023-05-08 DIAGNOSIS — I10 Essential (primary) hypertension: Secondary | ICD-10-CM

## 2023-05-08 DIAGNOSIS — R9431 Abnormal electrocardiogram [ECG] [EKG]: Secondary | ICD-10-CM | POA: Diagnosis not present

## 2023-05-08 DIAGNOSIS — F902 Attention-deficit hyperactivity disorder, combined type: Secondary | ICD-10-CM | POA: Diagnosis not present

## 2023-05-08 DIAGNOSIS — G629 Polyneuropathy, unspecified: Secondary | ICD-10-CM

## 2023-05-08 MED ORDER — LOSARTAN POTASSIUM 100 MG PO TABS
100.0000 mg | ORAL_TABLET | Freq: Every day | ORAL | Status: DC
Start: 2023-05-08 — End: 2023-11-09

## 2023-05-08 NOTE — Patient Instructions (Signed)
It was a pleasure to see you today.  Thank you for giving [place] the opportunity to be involved in your care.  Below is a brief recap of your visit and next steps.  We will plan to see you again in 6 months.  Summary No medication changes today No acute findings on ECG I recommend that you stop smoking. Please let us know what we can do to help. We will follow up on the referral for lung cancer screening Follow up in 6 months
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
## 2023-05-08 NOTE — Assessment & Plan Note (Signed)
Amlodipine was increased to 10 mg daily at his last appointment.  He is additionally prescribed losartan 100 mg daily.  BP today is 133/79.  No additional medication changes are indicated.

## 2023-05-08 NOTE — Assessment & Plan Note (Signed)
He continues to smoke 1 pack/day of cigarettes and is contemplating cessation.  Previously referred for lung cancer screening. -The patient was counseled on the dangers of tobacco use, and was advised to quit.  Reviewed strategies to maximize success, including removing cigarettes and smoking materials from environment, stress management, substitution of other forms of reinforcement, support of family/friends, and written materials.  -Will follow-up on the status of pulmonology referral for lung cancer screening

## 2023-05-08 NOTE — Assessment & Plan Note (Addendum)
His acute concern today is an abnormal ECG that was obtained earlier this week prior to undergoing colonoscopy.  Reviewed by me today.  Rate, rhythm, and axis are normal.  No acute findings are identified.

## 2023-05-08 NOTE — Assessment & Plan Note (Signed)
Followed by psychiatry (Dr. Tenny Craw).  Anxiety remains adequately controlled with Valium 10 mg nightly.

## 2023-05-08 NOTE — Assessment & Plan Note (Signed)
Followed by psychiatry (Dr. Tenny Craw).  Remains on Adderall 30 mg 3 times daily.

## 2023-05-08 NOTE — Assessment & Plan Note (Signed)
Reports today that pain has somewhat improved with physical therapy.  Symptoms are worse in the left leg.  He was previously referred to neurology and has an appointment scheduled for next month (8/6).

## 2023-05-13 ENCOUNTER — Other Ambulatory Visit (HOSPITAL_COMMUNITY): Payer: Self-pay

## 2023-05-14 ENCOUNTER — Other Ambulatory Visit (HOSPITAL_COMMUNITY): Payer: Self-pay

## 2023-05-14 DIAGNOSIS — Z419 Encounter for procedure for purposes other than remedying health state, unspecified: Secondary | ICD-10-CM | POA: Diagnosis not present

## 2023-05-15 ENCOUNTER — Other Ambulatory Visit: Payer: Self-pay

## 2023-05-15 ENCOUNTER — Other Ambulatory Visit (HOSPITAL_COMMUNITY): Payer: Self-pay

## 2023-05-18 ENCOUNTER — Ambulatory Visit: Payer: Commercial Managed Care - PPO | Admitting: Diagnostic Neuroimaging

## 2023-05-19 ENCOUNTER — Ambulatory Visit: Payer: Commercial Managed Care - PPO | Admitting: Diagnostic Neuroimaging

## 2023-05-19 ENCOUNTER — Encounter: Payer: Self-pay | Admitting: Diagnostic Neuroimaging

## 2023-05-19 VITALS — BP 119/74 | HR 67 | Ht 69.0 in | Wt 222.0 lb

## 2023-05-19 DIAGNOSIS — R202 Paresthesia of skin: Secondary | ICD-10-CM

## 2023-05-19 DIAGNOSIS — R200 Anesthesia of skin: Secondary | ICD-10-CM | POA: Diagnosis not present
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
## 2023-06-02 ENCOUNTER — Encounter: Payer: Self-pay | Admitting: *Deleted

## 2023-06-02 DIAGNOSIS — S52501D Unspecified fracture of the lower end of right radius, subsequent encounter for closed fracture with routine healing: Secondary | ICD-10-CM | POA: Diagnosis not present

## 2023-06-02 DIAGNOSIS — S72302D Unspecified fracture of shaft of left femur, subsequent encounter for closed fracture with routine healing: Secondary | ICD-10-CM | POA: Diagnosis not present

## 2023-06-14 DIAGNOSIS — Z419 Encounter for procedure for purposes other than remedying health state, unspecified: Secondary | ICD-10-CM | POA: Diagnosis not present

## 2023-06-16 ENCOUNTER — Other Ambulatory Visit (HOSPITAL_COMMUNITY): Payer: Self-pay

## 2023-06-16 ENCOUNTER — Encounter (HOSPITAL_COMMUNITY): Payer: Self-pay | Admitting: Psychiatry

## 2023-06-16 ENCOUNTER — Telehealth (INDEPENDENT_AMBULATORY_CARE_PROVIDER_SITE_OTHER): Payer: Commercial Managed Care - PPO | Admitting: Psychiatry

## 2023-06-16 DIAGNOSIS — F419 Anxiety disorder, unspecified: Secondary | ICD-10-CM

## 2023-06-16 DIAGNOSIS — F902 Attention-deficit hyperactivity disorder, combined type: Secondary | ICD-10-CM | POA: Diagnosis not present

## 2023-06-16 DIAGNOSIS — F17210 Nicotine dependence, cigarettes, uncomplicated: Secondary | ICD-10-CM

## 2023-06-16 DIAGNOSIS — F5105 Insomnia due to other mental disorder: Secondary | ICD-10-CM

## 2023-06-16 DIAGNOSIS — G479 Sleep disorder, unspecified: Secondary | ICD-10-CM | POA: Diagnosis not present

## 2023-06-16 MED ORDER — DIAZEPAM 10 MG PO TABS
10.0000 mg | ORAL_TABLET | Freq: Every day | ORAL | 2 refills | Status: DC
Start: 2023-06-16 — End: 2023-09-15
  Filled 2023-06-16: qty 30, 30d supply, fill #0
  Filled 2023-07-14: qty 30, 30d supply, fill #1
  Filled 2023-08-17: qty 30, 30d supply, fill #2

## 2023-06-16 MED ORDER — AMPHETAMINE-DEXTROAMPHETAMINE 30 MG PO TABS
30.0000 mg | ORAL_TABLET | Freq: Three times a day (TID) | ORAL | 0 refills | Status: DC
Start: 2023-06-16 — End: 2023-09-15
  Filled 2023-06-16: qty 90, 30d supply, fill #0

## 2023-06-16 MED ORDER — VARENICLINE TARTRATE 0.5 MG PO TABS: 0.5000 mg | ORAL_TABLET | Freq: Two times a day (BID) | ORAL | 2 refills | Status: DC

## 2023-06-16 MED ORDER — AMPHETAMINE-DEXTROAMPHETAMINE 30 MG PO TABS
30.0000 mg | ORAL_TABLET | Freq: Three times a day (TID) | ORAL | 0 refills | Status: DC
Start: 2023-06-16 — End: 2023-09-15
  Filled 2023-06-16 – 2023-08-17 (×4): qty 90, 30d supply, fill #0

## 2023-06-16 MED ORDER — AMPHETAMINE-DEXTROAMPHETAMINE 30 MG PO TABS
30.0000 mg | ORAL_TABLET | Freq: Three times a day (TID) | ORAL | 0 refills | Status: DC
Start: 2023-06-16 — End: 2023-09-15
  Filled 2023-06-16 – 2023-07-15 (×3): qty 90, 30d supply, fill #0
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
## 2023-06-17 ENCOUNTER — Other Ambulatory Visit (HOSPITAL_COMMUNITY): Payer: Self-pay | Admitting: Psychiatry

## 2023-06-17 MED ORDER — VARENICLINE TARTRATE 0.5 MG PO TABS: 0.5000 mg | ORAL_TABLET | Freq: Two times a day (BID) | ORAL | 2 refills | Status: DC

## 2023-07-08 ENCOUNTER — Other Ambulatory Visit (HOSPITAL_COMMUNITY): Payer: Self-pay

## 2023-07-13 ENCOUNTER — Other Ambulatory Visit (HOSPITAL_COMMUNITY): Payer: Self-pay

## 2023-07-14 ENCOUNTER — Other Ambulatory Visit (HOSPITAL_COMMUNITY): Payer: Self-pay

## 2023-07-14 ENCOUNTER — Other Ambulatory Visit: Payer: Self-pay

## 2023-07-14 DIAGNOSIS — Z419 Encounter for procedure for purposes other than remedying health state, unspecified: Secondary | ICD-10-CM | POA: Diagnosis not present

## 2023-07-15 ENCOUNTER — Other Ambulatory Visit (HOSPITAL_COMMUNITY): Payer: Self-pay

## 2023-08-08 ENCOUNTER — Other Ambulatory Visit: Payer: Self-pay | Admitting: Internal Medicine

## 2023-08-08 DIAGNOSIS — I10 Essential (primary) hypertension: Secondary | ICD-10-CM

## 2023-08-10 ENCOUNTER — Other Ambulatory Visit (HOSPITAL_COMMUNITY): Payer: Self-pay

## 2023-08-14 DIAGNOSIS — Z419 Encounter for procedure for purposes other than remedying health state, unspecified: Secondary | ICD-10-CM | POA: Diagnosis not present

## 2023-08-17 ENCOUNTER — Other Ambulatory Visit (HOSPITAL_COMMUNITY): Payer: Self-pay

## 2023-08-27 ENCOUNTER — Encounter: Payer: Self-pay | Admitting: Internal Medicine

## 2023-08-27 ENCOUNTER — Ambulatory Visit (INDEPENDENT_AMBULATORY_CARE_PROVIDER_SITE_OTHER): Payer: Commercial Managed Care - PPO | Admitting: Internal Medicine

## 2023-08-27 VITALS — BP 115/75 | HR 76 | Ht 69.0 in | Wt 219.6 lb

## 2023-08-27 DIAGNOSIS — M109 Gout, unspecified: Secondary | ICD-10-CM | POA: Insufficient documentation

## 2023-08-27 MED ORDER — COLCHICINE 0.6 MG PO TABS
0.6000 mg | ORAL_TABLET | Freq: Every day | ORAL | 0 refills | Status: DC
Start: 2023-08-27 — End: 2024-01-29

## 2023-08-27 NOTE — Assessment & Plan Note (Signed)
Presenting today for an acute visit endorsing a 3-week history of right foot pain and swelling.  He he feels that symptoms are overall improving.  On exam there is diffuse swelling at the right foot radiating just above the right ankle.  Mild erythema is present.  Pulses are intact.  There is tenderness to palpation over the dorsal midfoot.  Symptoms seem most consistent with acute gout flare.  He endorses heavy alcohol consumption, drinking a case of beer daily. -Colchicine prescribed for treatment of gout flare.  Will check uric acid level today.  He was instructed return to care if symptoms worsen or fail to improve.

## 2023-08-27 NOTE — Progress Notes (Signed)
   Acute Office Visit  Subjective:     Patient ID: Todd Gilbert, male    DOB: 1971/02/11, 52 y.o.   MRN: [id number]  Chief Complaint  Patient presents with   Foot Swelling    Right foot swollen    Todd Gilbert presents today for an acute visit endorsing a 3-week history of right foot pain and swelling.  There was no inciting event or trauma at the onset of pain.  He endorses pain in the right midfoot with swelling radiating proximally to the mid shin.  Overall he states that pain and swelling is improving.  He has tried to rest his leg for the last 3-4 days and elevate as much as possible.  He has not tried taking any medication specifically for pain relief.  This has not happened previously.  Review of Systems  Musculoskeletal:        Right foot pain/swelling      Objective:    BP 115/75 (BP Location: Right Arm, Patient Position: Sitting, Cuff Size: Large)   Pulse 76   Ht 5\' 9"  (1.753 m)   Wt 219 lb 9.6 oz (99.6 kg)   SpO2 94%   BMI 32.43 kg/m   Physical Exam Musculoskeletal:        General: Swelling present.     Comments: Diffuse swelling encompassing the right foot radiating proximally just above the right ankle.  ROM at the right ankle is limited secondary to swelling.  There is tenderness to palpation along the dorsal aspect of the right midfoot.  No open wounds or purulence is appreciated.  Intact DP/PT pulses.       Assessment & Plan:   Problem List Items Addressed This Visit       Acute gout of right foot - Primary    Presenting today for an acute visit endorsing a 3-week history of right foot pain and swelling.  He he feels that symptoms are overall improving.  On exam there is diffuse swelling at the right foot radiating just above the right ankle.  Mild erythema is present.  Pulses are intact.  There is tenderness to palpation over the dorsal midfoot.  Symptoms seem most consistent with acute gout flare.  He endorses heavy alcohol consumption, drinking a case of  beer daily. -Colchicine prescribed for treatment of gout flare.  Will check uric acid level today.  He was instructed return to care if symptoms worsen or fail to improve.       Meds ordered this encounter  Medications   colchicine 0.6 MG tablet    Sig: Take 1 tablet (0.6 mg total) by mouth daily.    Dispense:  20 tablet    Refill:  0    Return if symptoms worsen or fail to improve.  Billie Lade, MD

## 2023-08-27 NOTE — Patient Instructions (Signed)
It was a pleasure to see you today.  Thank you for giving [place] the opportunity to be involved in your care.  Below is a brief recap of your visit and next steps.  We will plan to see you again in January.  Summary Colchcine prescribed for treatment of gout flare. Check uric acid level. Follow up as scheduled in January

## 2023-08-28 LAB — URIC ACID: Uric Acid: 4.3 mg/dL (ref 3.8–8.4)

## 2023-09-01 ENCOUNTER — Ambulatory Visit: Payer: Self-pay | Admitting: Internal Medicine

## 2023-09-01 NOTE — Telephone Encounter (Signed)
Copied from CRM [id number]. Topic: Clinical - Red Word Triage >> Sep 01, 2023  8:38 AM Dennison Nancy wrote: Red Word that prompted transfer to Nurse Triage: swelling in right foot   Chief Complaint: right foot swelling up to almost calf Symptoms: swelling and some bruising Frequency: ongoing since last office visit on 08/27/2023 Pertinent Negatives: Patient denies SOB, chest pain Disposition: [] ED /[] Urgent Care (no appt availability in office) / [] Appointment(In office/virtual)/ []  [facility]/ [] Home Care/ [] Refused Recommended Disposition /[] [place] [facility]/ [x]  Follow-up with PCP Additional Notes: agent attempted to transfer call from pt's Mom c/o pt having swelling: call was dropped during transfer: Nurse called pt on number listed on chart: pt c/o 10/10 right foot pain and swelling from foot to almost calf area: pt stated some bruising that has gotten better.  Pt stated at office visit on 08/27/2023 PCP gave medications and stated if not better x3 days then discontinue medications and pt stated currently not taking prescribed medications: only taking tylenol and ibuprofen OTC for pain without relief.  Nurse offered pt appt for today: pt declined and pt stated he would like to be re-evaluated by PCP on Friday after 1pm: at present time no openings on PCP schedule on Friday.  Will route chart to PCP to see if patient can be worked into schedule. Reason for Disposition  MILD or MODERATE ankle swelling (e.g., can't move joint normally, can't do usual activities) (Exceptions: Itchy, localized swelling; swelling is chronic.)  Answer Assessment - Initial Assessment Questions 1. LOCATION: "Which ankle is swollen?" "Where is the swelling?"     Right foot up to almost calf swelling 2. ONSET: "When did the swelling start?"     Seen in office on 08/27/2023 for swelling and it has gotten worse 3. SWELLING: "How bad is the swelling?" Or, "How large is it?" (e.g., mild, moderate, severe; size  of localized swelling)    - NONE: No joint swelling.   - LOCALIZED: Localized; small area of puffy or swollen skin (e.g., insect bite, skin irritation).   - MILD: Joint looks or feels mildly swollen or puffy.   - MODERATE: Swollen; interferes with normal activities (e.g., work or school); decreased range of movement; may be limping.   - SEVERE: Very swollen; can't move swollen joint at all; limping a lot or unable to walk.     Swelling has gotten worse since office visit - now unable to move 2 of his toes due to swellen 4. PAIN: "Is there any pain?" If Yes, ask: "How bad is it?" (Scale 1-10; or mild, moderate, severe)   - NONE (0): no pain.   - MILD (1-3): doesn't interfere with normal activities.    - MODERATE (4-7): interferes with normal activities (e.g., work or school) or awakens from sleep, limping.    - SEVERE (8-10): excruciating pain, unable to do any normal activities, unable to walk.      10/10: patient taking tylenol and ibuprofen without relief 5. CAUSE: "What do you think caused the ankle swelling?"     Gout noted in 08/27/2023 visit- PCP prescribed medications and informed pt if not better in 3 days to stop taking medication: patient he is no longer on medications 6. OTHER SYMPTOMS: "Do you have any other symptoms?" (e.g., fever, chest pain, difficulty breathing, calf pain)     No SOB, no fever, no chest pain 7. PREGNANCY: "Is there any chance you are pregnant?" "When was your last menstrual period?"  no  Protocols used: Ankle Swelling-A-AH

## 2023-09-01 NOTE — Telephone Encounter (Signed)
Copied from CRM [id number]. Topic: Clinical - Red Word Triage >> Sep 01, 2023  8:37 AM Dennison Nancy wrote: Red Word that prompted transfer to Nurse Triage: swelling in right foot.

## 2023-09-04 ENCOUNTER — Ambulatory Visit (INDEPENDENT_AMBULATORY_CARE_PROVIDER_SITE_OTHER): Payer: Commercial Managed Care - PPO | Admitting: Internal Medicine

## 2023-09-04 ENCOUNTER — Ambulatory Visit (HOSPITAL_COMMUNITY)
Admission: RE | Admit: 2023-09-04 | Discharge: 2023-09-04 | Disposition: A | Discharge status: Home / Self Care | Payer: Commercial Managed Care - PPO | Source: Ambulatory Visit | Attending: Internal Medicine

## 2023-09-04 ENCOUNTER — Encounter: Payer: Self-pay | Admitting: Internal Medicine

## 2023-09-04 VITALS — BP 140/75 | HR 83 | Resp 16 | Ht 69.0 in | Wt 222.4 lb

## 2023-09-04 DIAGNOSIS — M79671 Pain in right foot: Secondary | ICD-10-CM | POA: Diagnosis not present

## 2023-09-04 DIAGNOSIS — M25571 Pain in right ankle and joints of right foot: Secondary | ICD-10-CM | POA: Diagnosis not present

## 2023-09-04 DIAGNOSIS — M19071 Primary osteoarthritis, right ankle and foot: Secondary | ICD-10-CM | POA: Diagnosis not present

## 2023-09-04 MED ORDER — PREDNISONE 20 MG PO TABS
40.0000 mg | ORAL_TABLET | Freq: Every day | ORAL | 0 refills | Status: AC
Start: 2023-09-04 — End: 2023-09-09

## 2023-09-04 NOTE — Patient Instructions (Signed)
It was a pleasure to see you today.  Thank you for giving [place] the opportunity to be involved in your care.  Below is a brief recap of your visit and next steps.  We will plan to see you again in January.  Summary Start prednisone 40 mg x 5 days Right foot xrays ordered Follow up in January

## 2023-09-04 NOTE — Assessment & Plan Note (Signed)
He returns to care today for evaluation of persistent pain and swelling in the right midfoot.  Last seen by me on 11/14 and I prescribed colchicine for suspected gout flare given heavy alcohol consumption.  Symptoms have not progressed, but have not improved.  Question gout flare vs stress fracture vs ligament strain.  He is unaware of any trauma or inciting event at the onset of pain 4 weeks ago.  Lower concern for infection given persistence of symptoms without progression.  There is no significant erythema present. -Prednisone 40 mg x 5 days prescribed as I believe gout flare is still a likely etiology of his symptoms. -Will also order x-rays of the right foot -Consider orthopedic surgery referral if symptoms do not improve with prednisone and no acute findings are noted on x-ray

## 2023-09-04 NOTE — Progress Notes (Signed)
   Acute Office Visit  Subjective:     Patient ID: Todd Gilbert, male    DOB: 1970-12-13, 52 y.o.   MRN: [id number]  Chief Complaint  Patient presents with   Foot Pain    Pain and swelling since starting back working. Rates pain 10/10 after working and today its about 7/10   Todd Gilbert returns to care today for an acute visit in the setting of persistent right foot pain and swelling.  He was last evaluated by me on 11/14.  Treated for suspected gout flare with colchicine.  He reports that he took colchicine for 2 days.  Symptoms did not improved so he stopped taking it.  Pain has persisted since that time and is worse with increased ambulation.  He has been working in [employer] and states that pain is tolerable for about an hour but then he has to rest.  Pain significantly improves after he is off of his feet.  Swelling has not worsened, but has not improved.  No systemic symptoms of fever/chills or myalgias.  Review of Systems  Musculoskeletal:        Right foot pain/swelling  All other systems reviewed and are negative.     Objective:    BP (!) 140/75   Pulse 83   Resp 16   Ht 5\' 9"  (1.753 m)   Wt 222 lb 6.4 oz (100.9 kg)   SpO2 96%   BMI 32.84 kg/m   Physical Exam Musculoskeletal:        General: Swelling and tenderness present.     Comments: Diffuse swelling without significant erythema is present on the right foot radiating proximally just above the right ankle.  Similar appearance to exam on 11/14.  No open wounds or purulence are appreciated.  There is tenderness to palpation over the dorsal midfoot.  DP/PT pulses are intact.       Assessment & Plan:   Problem List Items Addressed This Visit       Pain of right midfoot - Primary    He returns to care today for evaluation of persistent pain and swelling in the right midfoot.  Last seen by me on 11/14 and I prescribed colchicine for suspected gout flare given heavy alcohol consumption.  Symptoms have not  progressed, but have not improved.  Question gout flare vs stress fracture vs ligament strain.  He is unaware of any trauma or inciting event at the onset of pain 4 weeks ago.  Lower concern for infection given persistence of symptoms without progression.  There is no significant erythema present. -Prednisone 40 mg x 5 days prescribed as I believe gout flare is still a likely etiology of his symptoms. -Will also order x-rays of the right foot -Consider orthopedic surgery referral if symptoms do not improve with prednisone and no acute findings are noted on x-ray       Meds ordered this encounter  Medications   predniSONE (DELTASONE) 20 MG tablet    Sig: Take 2 tablets (40 mg total) by mouth daily for 5 days.    Dispense:  10 tablet    Refill:  0    Return if symptoms worsen or fail to improve.  Todd Lade, MD

## 2023-09-13 DIAGNOSIS — Z419 Encounter for procedure for purposes other than remedying health state, unspecified: Secondary | ICD-10-CM | POA: Diagnosis not present

## 2023-09-15 ENCOUNTER — Encounter (HOSPITAL_COMMUNITY): Payer: Self-pay | Admitting: Psychiatry

## 2023-09-15 ENCOUNTER — Other Ambulatory Visit (HOSPITAL_COMMUNITY): Payer: Self-pay

## 2023-09-15 ENCOUNTER — Telehealth (INDEPENDENT_AMBULATORY_CARE_PROVIDER_SITE_OTHER): Payer: Commercial Managed Care - PPO | Admitting: Psychiatry

## 2023-09-15 DIAGNOSIS — F17210 Nicotine dependence, cigarettes, uncomplicated: Secondary | ICD-10-CM

## 2023-09-15 DIAGNOSIS — F419 Anxiety disorder, unspecified: Secondary | ICD-10-CM

## 2023-09-15 DIAGNOSIS — F902 Attention-deficit hyperactivity disorder, combined type: Secondary | ICD-10-CM | POA: Diagnosis not present

## 2023-09-15 MED ORDER — AMPHETAMINE-DEXTROAMPHETAMINE 30 MG PO TABS
30.0000 mg | ORAL_TABLET | Freq: Three times a day (TID) | ORAL | 0 refills | Status: DC
Start: 2023-10-15 — End: 2023-12-11
  Filled 2023-09-15 – 2023-10-16 (×2): qty 90, 30d supply, fill #0

## 2023-09-15 MED ORDER — AMPHETAMINE-DEXTROAMPHETAMINE 30 MG PO TABS
30.0000 mg | ORAL_TABLET | Freq: Three times a day (TID) | ORAL | 0 refills | Status: DC
Start: 2023-09-15 — End: 2023-12-11
  Filled 2023-09-15: qty 90, 30d supply, fill #0

## 2023-09-15 MED ORDER — AMPHETAMINE-DEXTROAMPHETAMINE 30 MG PO TABS
30.0000 mg | ORAL_TABLET | Freq: Three times a day (TID) | ORAL | 0 refills | Status: DC
  Filled 2023-09-15 – 2023-11-16 (×2): qty 90, 30d supply, fill #0

## 2023-09-15 MED ORDER — DIAZEPAM 10 MG PO TABS
10.0000 mg | ORAL_TABLET | Freq: Every day | ORAL | 2 refills | Status: DC
Start: 2023-09-15 — End: 2023-12-11
  Filled 2023-09-15: qty 30, 30d supply, fill #0
  Filled 2023-10-16: qty 30, 30d supply, fill #1
  Filled 2023-11-16: qty 30, 30d supply, fill #2
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
## 2023-09-29 ENCOUNTER — Encounter (INDEPENDENT_AMBULATORY_CARE_PROVIDER_SITE_OTHER): Payer: Self-pay | Admitting: *Deleted

## 2023-10-14 DIAGNOSIS — Z419 Encounter for procedure for purposes other than remedying health state, unspecified: Secondary | ICD-10-CM | POA: Diagnosis not present

## 2023-10-15 ENCOUNTER — Other Ambulatory Visit: Payer: Self-pay

## 2023-10-15 ENCOUNTER — Other Ambulatory Visit (HOSPITAL_COMMUNITY): Payer: Self-pay

## 2023-10-16 ENCOUNTER — Other Ambulatory Visit (HOSPITAL_COMMUNITY): Payer: Self-pay

## 2023-11-09 ENCOUNTER — Ambulatory Visit: Payer: Commercial Managed Care - PPO | Admitting: Internal Medicine

## 2023-11-09 ENCOUNTER — Encounter: Payer: Self-pay | Admitting: Internal Medicine

## 2023-11-09 VITALS — BP 140/78 | HR 69 | Ht 68.0 in | Wt 224.4 lb

## 2023-11-09 DIAGNOSIS — F902 Attention-deficit hyperactivity disorder, combined type: Secondary | ICD-10-CM

## 2023-11-09 DIAGNOSIS — F419 Anxiety disorder, unspecified: Secondary | ICD-10-CM

## 2023-11-09 DIAGNOSIS — F1090 Alcohol use, unspecified, uncomplicated: Secondary | ICD-10-CM

## 2023-11-09 DIAGNOSIS — S161XXA Strain of muscle, fascia and tendon at neck level, initial encounter: Secondary | ICD-10-CM | POA: Diagnosis not present

## 2023-11-09 DIAGNOSIS — I10 Essential (primary) hypertension: Secondary | ICD-10-CM | POA: Diagnosis not present

## 2023-11-09 DIAGNOSIS — Z720 Tobacco use: Secondary | ICD-10-CM

## 2023-11-09 MED ORDER — AMLODIPINE BESYLATE 10 MG PO TABS
10.0000 mg | ORAL_TABLET | Freq: Every day | ORAL | 3 refills | Status: AC
Start: 2023-11-09 — End: ?

## 2023-11-09 MED ORDER — LOSARTAN POTASSIUM 100 MG PO TABS
100.0000 mg | ORAL_TABLET | Freq: Every day | ORAL | 3 refills | Status: AC
Start: 2023-11-09 — End: ?

## 2023-11-09 NOTE — Assessment & Plan Note (Signed)
Remains adequately controlled with Valium 10 mg nightly.  Closely followed by psychiatry.

## 2023-11-09 NOTE — Assessment & Plan Note (Signed)
He continues smoke 1 pack/day of cigarettes and remains precontemplative with regards to cessation.  He remains interested in lung cancer screening.  Will follow-up in the status of this referral today.

## 2023-11-09 NOTE — Assessment & Plan Note (Signed)
BP is elevated today.  He is currently prescribed amlodipine 10 mg daily and losartan 100 mg daily.  He reports intermittent compliance with both medications recently and did not take them prior to his appointment. -No medication changes were made today.  Antihypertensive medications were refilled.  Follow-up in 3 months for reassessment.

## 2023-11-09 NOTE — Assessment & Plan Note (Signed)
Followed by psychiatry (Dr. Tenny Craw).  Remains adequately controlled with Adderall 30 mg 3 times daily.

## 2023-11-09 NOTE — Assessment & Plan Note (Signed)
He reports drinking 15 beers per day and has been drinking alcohol since age 53.  He states that he has not experienced alcohol withdrawal previously, but endorses feeling poorly if he does not drink a beer after waking up in the morning.  He acknowledges heavy alcohol consumption but is not concerned about complications of heavy alcohol use.  Not interested in quitting at this time. -I expressed my concern for heavy alcohol use with Todd Gilbert.  He will consider cessation when he feels the time is appropriate.

## 2023-11-09 NOTE — Patient Instructions (Signed)
It was a pleasure to see you today.  Thank you for giving [place] the opportunity to be involved in your care.  Below is a brief recap of your visit and next steps.  We will plan to see you again in 3 months.   Summary No medication changes today Refills provided Please see the attached exercises for your neck Follow up in 3 months

## 2023-11-09 NOTE — Assessment & Plan Note (Signed)
His acute concern today is left-sided neck pain with numbness/tingling along the superior aspect of the left shoulder.  ROM is generally intact.  Positive Spurling's noted on exam.  Suspect left-sided cervical strain with radicular symptoms.  He states that discomfort is worse in the morning and improves with stretching.  Home PT exercises were provided today.  He will return to care if symptoms worsen or fail to improve.
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
## 2023-11-13 ENCOUNTER — Other Ambulatory Visit (HOSPITAL_COMMUNITY): Payer: Self-pay

## 2023-11-14 DIAGNOSIS — Z419 Encounter for procedure for purposes other than remedying health state, unspecified: Secondary | ICD-10-CM | POA: Diagnosis not present

## 2023-11-16 ENCOUNTER — Other Ambulatory Visit (HOSPITAL_COMMUNITY): Payer: Self-pay

## 2023-11-16 ENCOUNTER — Other Ambulatory Visit: Payer: Self-pay

## 2023-11-16 DIAGNOSIS — Z122 Encounter for screening for malignant neoplasm of respiratory organs: Secondary | ICD-10-CM

## 2023-11-16 DIAGNOSIS — Z87891 Personal history of nicotine dependence: Secondary | ICD-10-CM

## 2023-11-16 NOTE — Progress Notes (Signed)
Received referral for initial lung cancer screening scan. Contacted patient and obtained smoking history (started age 53, current smoker continuing to smoke 1PPD, 38 pack year) as well as answering questions related to the screening process. Patient denies signs/symptoms of lung cancer such as weight loss or hemoptysis. Patient denies comorbidity that would prevent curative treatment if lung cancer were to be found. Patient is scheduled for shared decision making visit and CT scan on 12/25/2022 at 1300.

## 2023-12-01 ENCOUNTER — Ambulatory Visit: Payer: Commercial Managed Care - PPO | Admitting: Internal Medicine

## 2023-12-11 ENCOUNTER — Telehealth (HOSPITAL_COMMUNITY): Payer: Commercial Managed Care - PPO | Admitting: Psychiatry

## 2023-12-11 ENCOUNTER — Encounter (HOSPITAL_COMMUNITY): Payer: Self-pay | Admitting: Psychiatry

## 2023-12-11 ENCOUNTER — Other Ambulatory Visit (HOSPITAL_COMMUNITY): Payer: Self-pay

## 2023-12-11 DIAGNOSIS — F902 Attention-deficit hyperactivity disorder, combined type: Secondary | ICD-10-CM | POA: Diagnosis not present

## 2023-12-11 DIAGNOSIS — F5105 Insomnia due to other mental disorder: Secondary | ICD-10-CM | POA: Diagnosis not present

## 2023-12-11 MED ORDER — AMPHETAMINE-DEXTROAMPHETAMINE 30 MG PO TABS
30.0000 mg | ORAL_TABLET | Freq: Three times a day (TID) | ORAL | 0 refills | Status: DC
Start: 2024-01-07 — End: 2024-01-29
  Filled 2023-12-11: qty 90, 30d supply, fill #0
  Filled 2023-12-11: qty 60, 20d supply, fill #0
  Filled 2024-01-13: qty 90, 30d supply, fill #0
  Filled ????-??-??: fill #0

## 2023-12-11 MED ORDER — AMPHETAMINE-DEXTROAMPHETAMINE 30 MG PO TABS
30.0000 mg | ORAL_TABLET | Freq: Three times a day (TID) | ORAL | 0 refills | Status: DC
Start: 2023-12-11 — End: 2024-01-29
  Filled 2023-12-11: qty 90, 30d supply, fill #0
  Filled 2023-12-11: qty 60, 20d supply, fill #0
  Filled 2023-12-11 – 2023-12-14 (×2): qty 90, 30d supply, fill #0
  Filled ????-??-??: fill #0

## 2023-12-11 MED ORDER — AMPHETAMINE-DEXTROAMPHETAMINE 30 MG PO TABS
30.0000 mg | ORAL_TABLET | Freq: Three times a day (TID) | ORAL | 0 refills | Status: DC
Start: 2024-02-07 — End: 2024-01-29
  Filled 2023-12-11: qty 60, 30d supply, fill #0

## 2023-12-11 MED ORDER — DIAZEPAM 10 MG PO TABS
10.0000 mg | ORAL_TABLET | Freq: Every day | ORAL | 2 refills | Status: DC
Start: 2023-12-11 — End: 2024-03-09
  Filled 2023-12-11 – 2023-12-14 (×2): qty 30, 30d supply, fill #0
  Filled 2024-01-13: qty 30, 30d supply, fill #1
  Filled 2024-02-11: qty 30, 30d supply, fill #2

## 2023-12-11 NOTE — Progress Notes (Deleted)
 Virtual Visit via Telephone Note  I connected with Todd Gilbert on 12/11/23 at 11:20 AM EST by telephone and verified that I am speaking with the correct person using two identifiers.  Location: Patient: home Provider: office   I discussed the limitations, risks, security and privacy concerns of performing an evaluation and management service by telephone and the availability of in person appointments. I also discussed with the patient that there may be a patient responsible charge related to this service. The patient expressed understanding and agreed to proceed.      I discussed the assessment and treatment plan with the patient. The patient was provided an opportunity to ask questions and all were answered. The patient agreed with the plan and demonstrated an understanding of the instructions.   The patient was advised to call back or seek an in-person evaluation if the symptoms worsen or if the condition fails to improve as anticipated.  I provided 20 minutes of non-face-to-face time during this encounter.   [name], MD
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
## 2023-12-12 DIAGNOSIS — Z419 Encounter for procedure for purposes other than remedying health state, unspecified: Secondary | ICD-10-CM | POA: Diagnosis not present

## 2023-12-14 ENCOUNTER — Other Ambulatory Visit (HOSPITAL_COMMUNITY): Payer: Self-pay

## 2023-12-14 ENCOUNTER — Other Ambulatory Visit: Payer: Self-pay

## 2023-12-25 ENCOUNTER — Inpatient Hospital Stay: Payer: Commercial Managed Care - PPO | Attending: Oncology | Admitting: Oncology

## 2023-12-25 ENCOUNTER — Ambulatory Visit (HOSPITAL_COMMUNITY)
Admission: RE | Admit: 2023-12-25 | Discharge: 2023-12-25 | Disposition: A | Discharge status: Home / Self Care | Payer: Commercial Managed Care - PPO | Source: Ambulatory Visit | Attending: Oncology | Admitting: Oncology

## 2023-12-25 DIAGNOSIS — Z87891 Personal history of nicotine dependence: Secondary | ICD-10-CM

## 2023-12-25 DIAGNOSIS — R918 Other nonspecific abnormal finding of lung field: Secondary | ICD-10-CM | POA: Diagnosis not present

## 2023-12-25 DIAGNOSIS — F17210 Nicotine dependence, cigarettes, uncomplicated: Secondary | ICD-10-CM | POA: Diagnosis not present

## 2023-12-25 DIAGNOSIS — Z122 Encounter for screening for malignant neoplasm of respiratory organs: Secondary | ICD-10-CM | POA: Insufficient documentation

## 2023-12-25 NOTE — Progress Notes (Signed)
   [facility] [address], [place] [address]   CLINIC:  Medical Oncology/Hematology  I discussed the assessment and treatment plan with the patient. The patient was provided an opportunity to ask questions and all were answered. The patient agreed with the plan and demonstrated an understanding of the instructions.   The patient was advised to call back or seek an in-person evaluation if the symptoms worsen or if the condition fails to improve as anticipated.   In accordance with CMS guidelines, patient has met eligibility criteria including age, absence of signs or symptoms of lung cancer.  Social History   Tobacco Use   Smoking status: Every Day    Current packs/day: 0.00    Average packs/day: 0.5 packs/day for 35.0 years (17.5 ttl pk-yrs)    Types: Cigarettes    Start date: 05/13/1977    Last attempt to quit: 05/13/2012    Years since quitting: 11.6   Smokeless tobacco: Never   Tobacco comments:    quit a year ago  Substance Use Topics   Alcohol use: Yes    Comment: 6 pack a day.   Drug use: Not Currently    Comment: 20-30 years ago - pain meds/street drugs.      A shared decision-making session was conducted prior to the performance of CT scan. This includes one or more decision aids, includes benefits and harms of screening, follow-up diagnostic testing, over-diagnosis, false positive rate, and total radiation exposure.   Counseling on the importance of adherence to annual lung cancer LDCT screening, impact of co-morbidities, and ability or willingness to undergo diagnosis and treatment is imperative for compliance of the program.   Counseling on the importance of continued smoking cessation for former smokers; the importance of smoking cessation for current smokers, and information about tobacco cessation interventions have been given to patient including [employer] Quit Smart and 1800 quit  programs.   Written order for lung cancer screening with  LDCT has been given to the patient and any and all questions have been answered to the best of my abilities.    Yearly follow up will be coordinated by Kennith Gain, RN, Thoracic Navigator.  I spent 20 minutes dedicated to the care of this patient (face-to-face and non-face-to-face) on the date of the encounter to include what is described in the assessment and plan.   Mauro Kaufmann, NP

## 2024-01-13 ENCOUNTER — Other Ambulatory Visit (HOSPITAL_COMMUNITY): Payer: Self-pay

## 2024-01-13 ENCOUNTER — Other Ambulatory Visit: Payer: Self-pay

## 2024-01-18 NOTE — Progress Notes (Signed)
 Patient notified of LDCT Lung Cancer Screening Results via mail with the recommendation to follow-up in 12 months. Patient's referring provider has been sent a copy of results. Results are as follows:  IMPRESSION: 1. Lung-RADS 2, benign appearance or behavior. Continue annual screening with low-dose chest CT without contrast in 12 months. 2. One vessel coronary atherosclerosis. 3. Mild diffuse hepatic steatosis. 4.  Emphysema (ICD10-J43.9).

## 2024-01-23 DIAGNOSIS — Z419 Encounter for procedure for purposes other than remedying health state, unspecified: Secondary | ICD-10-CM | POA: Diagnosis not present

## 2024-01-28 ENCOUNTER — Telehealth: Payer: Self-pay

## 2024-01-28 NOTE — Telephone Encounter (Signed)
 Scheduled

## 2024-01-28 NOTE — Telephone Encounter (Signed)
 Copied from CRM [id number]. Topic: Appointments - Scheduling Inquiry for Clinic >> Jan 28, 2024 11:01 AM Jyl Or S wrote: Reason for CRM: Patient has appt with Dr Dixion on 4/28 patient is unable to make appt he was asking for a Friday after 1pm he stated Dr dixion is no longer there and not sure who is suppose to be scheduled with please follow up with patient

## 2024-01-29 ENCOUNTER — Ambulatory Visit (INDEPENDENT_AMBULATORY_CARE_PROVIDER_SITE_OTHER)

## 2024-01-29 VITALS — BP 138/84 | HR 84 | Ht 69.0 in | Wt 212.0 lb

## 2024-01-29 DIAGNOSIS — B360 Pityriasis versicolor: Secondary | ICD-10-CM

## 2024-01-29 DIAGNOSIS — Z23 Encounter for immunization: Secondary | ICD-10-CM | POA: Diagnosis not present

## 2024-01-29 MED ORDER — KETOCONAZOLE 2 % EX SHAM
1.0000 | MEDICATED_SHAMPOO | CUTANEOUS | 2 refills | Status: AC
Start: 2024-02-01 — End: 2024-03-02

## 2024-01-29 NOTE — Progress Notes (Unsigned)
   Established Patient Office Visit  Subjective   Patient ID: Todd Gilbert, male    DOB: 14-Feb-1971  Age: 53 y.o. MRN: [id number]  Chief Complaint  Patient presents with   Medical Management of Chronic Issues    3 month follow up, pt states "Something bit him on the back of head and white spots coming up on his arm"    Rash This is a chronic problem. The current episode started more than 1 month ago. The problem has been gradually worsening since onset. The affected locations include the left hand, right hand, right arm and left arm. The rash is characterized by itchiness, scaling and peeling. He was exposed to nothing. Past treatments include nothing.      Review of Systems  Skin:  Positive for rash.      Objective:     BP 138/84   Pulse 84   Ht 5\' 9"  (1.753 m)   Wt 212 lb 0.6 oz (96.2 kg)   SpO2 94%   BMI 31.31 kg/m    Physical Exam Vitals and nursing note reviewed.  Constitutional:      Appearance: Normal appearance.  Eyes:     Extraocular Movements: Extraocular movements intact.     Pupils: Pupils are equal, round, and reactive to light.  Cardiovascular:     Rate and Rhythm: Normal rate and regular rhythm.  Pulmonary:     Effort: Pulmonary effort is normal.     Breath sounds: Normal breath sounds.  Musculoskeletal:     Cervical back: Normal range of motion and neck supple.  Skin:    Findings: Lesion (hypopigmented lesions scattered on forearms and hands) present.  Neurological:     Mental Status: He is alert and oriented to person, place, and time.  Psychiatric:        Mood and Affect: Mood normal.        Thought Content: Thought content normal.      No results found for any visits on 01/29/24.    The 10-year ASCVD risk score (Arnett DK, et al., 2019) is: 9.2%    Assessment & Plan:   Problem List Items Addressed This Visit   None Visit Diagnoses       Need for Streptococcus pneumoniae vaccination    -  Primary   Relevant Orders    Pneumococcal conjugate vaccine 20-valent (Prevnar 20)     Need for shingles vaccine       Relevant Orders   Zoster Recombinant (Shingrix  )     Tinea versicolor       Relevant Medications   ketoconazole  (NIZORAL ) 2 % shampoo     Encounter for immunization       Relevant Orders   Varicella-zoster vaccine IM (Completed)   Pneumococcal conjugate vaccine 20-valent (Completed)       Return in about 6 months (around 07/30/2024).    Alison Irvine, FNP

## 2024-02-08 ENCOUNTER — Ambulatory Visit: Payer: Commercial Managed Care - PPO | Admitting: Internal Medicine

## 2024-02-10 ENCOUNTER — Other Ambulatory Visit (HOSPITAL_COMMUNITY): Payer: Self-pay

## 2024-02-10 ENCOUNTER — Other Ambulatory Visit (HOSPITAL_COMMUNITY): Payer: Self-pay | Admitting: Psychiatry

## 2024-02-10 ENCOUNTER — Telehealth (HOSPITAL_COMMUNITY): Payer: Self-pay | Admitting: *Deleted

## 2024-02-10 MED ORDER — AMPHETAMINE-DEXTROAMPHETAMINE 30 MG PO TABS
30.0000 mg | ORAL_TABLET | Freq: Three times a day (TID) | ORAL | 0 refills | Status: DC
Start: 2024-02-10 — End: 2024-03-09
  Filled 2024-02-10 – 2024-02-11 (×2): qty 90, 30d supply, fill #0

## 2024-02-10 NOTE — Telephone Encounter (Signed)
 Patient called stating he went to fill his Adderall 30mg  and they informed him that it was discontinued. Patient stated he never did stop his Adderall and would like for provider to please add that back to his chart. Per pt he is also needing refills for his Adderall to be sent to [facility].

## 2024-02-10 NOTE — Telephone Encounter (Signed)
 Patient aware.

## 2024-02-10 NOTE — Telephone Encounter (Signed)
 sent

## 2024-02-11 ENCOUNTER — Other Ambulatory Visit (HOSPITAL_COMMUNITY): Payer: Self-pay

## 2024-02-22 DIAGNOSIS — Z419 Encounter for procedure for purposes other than remedying health state, unspecified: Secondary | ICD-10-CM | POA: Diagnosis not present

## 2024-03-09 ENCOUNTER — Other Ambulatory Visit (HOSPITAL_COMMUNITY): Payer: Self-pay

## 2024-03-09 ENCOUNTER — Encounter (HOSPITAL_COMMUNITY): Payer: Self-pay | Admitting: Psychiatry

## 2024-03-09 ENCOUNTER — Telehealth (HOSPITAL_COMMUNITY): Admitting: Psychiatry

## 2024-03-09 DIAGNOSIS — F5105 Insomnia due to other mental disorder: Secondary | ICD-10-CM

## 2024-03-09 DIAGNOSIS — F902 Attention-deficit hyperactivity disorder, combined type: Secondary | ICD-10-CM

## 2024-03-09 MED ORDER — DIAZEPAM 10 MG PO TABS
10.0000 mg | ORAL_TABLET | Freq: Every day | ORAL | 2 refills | Status: DC
  Filled 2024-03-09 – 2024-03-11 (×2): qty 30, 30d supply, fill #0
  Filled 2024-04-08: qty 30, 30d supply, fill #1
  Filled 2024-05-06 (×2): qty 30, 30d supply, fill #0
  Filled ????-??-??: fill #2

## 2024-03-09 MED ORDER — AMPHETAMINE-DEXTROAMPHETAMINE 30 MG PO TABS
30.0000 mg | ORAL_TABLET | Freq: Three times a day (TID) | ORAL | 0 refills | Status: DC
  Filled 2024-03-09 – 2024-03-11 (×2): qty 90, 30d supply, fill #0

## 2024-03-09 MED ORDER — AMPHETAMINE-DEXTROAMPHETAMINE 30 MG PO TABS
30.0000 mg | ORAL_TABLET | Freq: Three times a day (TID) | ORAL | 0 refills | Status: DC
Start: 2024-03-09 — End: 2024-05-25
  Filled 2024-03-09 – 2024-05-06 (×3): qty 90, 30d supply, fill #0
  Filled ????-??-??: fill #0

## 2024-03-09 MED ORDER — AMPHETAMINE-DEXTROAMPHETAMINE 30 MG PO TABS
30.0000 mg | ORAL_TABLET | Freq: Three times a day (TID) | ORAL | 0 refills | Status: DC
  Filled 2024-03-09 – 2024-04-08 (×2): qty 90, 30d supply, fill #0
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
## 2024-03-11 ENCOUNTER — Other Ambulatory Visit: Payer: Self-pay

## 2024-03-11 ENCOUNTER — Other Ambulatory Visit (HOSPITAL_COMMUNITY): Payer: Self-pay

## 2024-03-24 ENCOUNTER — Other Ambulatory Visit (HOSPITAL_COMMUNITY): Payer: Self-pay

## 2024-03-24 DIAGNOSIS — Z419 Encounter for procedure for purposes other than remedying health state, unspecified: Secondary | ICD-10-CM | POA: Diagnosis not present

## 2024-04-04 ENCOUNTER — Other Ambulatory Visit (HOSPITAL_COMMUNITY): Payer: Self-pay

## 2024-04-08 ENCOUNTER — Other Ambulatory Visit (HOSPITAL_COMMUNITY): Payer: Self-pay

## 2024-04-08 ENCOUNTER — Other Ambulatory Visit: Payer: Self-pay

## 2024-04-23 DIAGNOSIS — Z419 Encounter for procedure for purposes other than remedying health state, unspecified: Secondary | ICD-10-CM | POA: Diagnosis not present

## 2024-05-04 ENCOUNTER — Other Ambulatory Visit (HOSPITAL_COMMUNITY): Payer: Self-pay

## 2024-05-04 ENCOUNTER — Other Ambulatory Visit (HOSPITAL_BASED_OUTPATIENT_CLINIC_OR_DEPARTMENT_OTHER): Payer: Self-pay

## 2024-05-06 ENCOUNTER — Other Ambulatory Visit: Payer: Self-pay

## 2024-05-06 ENCOUNTER — Other Ambulatory Visit (HOSPITAL_BASED_OUTPATIENT_CLINIC_OR_DEPARTMENT_OTHER): Payer: Self-pay

## 2024-05-24 DIAGNOSIS — Z419 Encounter for procedure for purposes other than remedying health state, unspecified: Secondary | ICD-10-CM | POA: Diagnosis not present

## 2024-05-25 ENCOUNTER — Other Ambulatory Visit (HOSPITAL_BASED_OUTPATIENT_CLINIC_OR_DEPARTMENT_OTHER): Payer: Self-pay

## 2024-05-25 ENCOUNTER — Telehealth (HOSPITAL_COMMUNITY): Admitting: Psychiatry

## 2024-05-25 ENCOUNTER — Encounter (HOSPITAL_COMMUNITY): Payer: Self-pay | Admitting: Psychiatry

## 2024-05-25 DIAGNOSIS — F902 Attention-deficit hyperactivity disorder, combined type: Secondary | ICD-10-CM | POA: Diagnosis not present

## 2024-05-25 DIAGNOSIS — F5105 Insomnia due to other mental disorder: Secondary | ICD-10-CM | POA: Diagnosis not present

## 2024-05-25 MED ORDER — AMPHETAMINE-DEXTROAMPHETAMINE 30 MG PO TABS
30.0000 mg | ORAL_TABLET | Freq: Three times a day (TID) | ORAL | 0 refills | Status: DC
  Filled 2024-05-25 – 2024-07-06 (×2): qty 90, 30d supply, fill #0

## 2024-05-25 MED ORDER — AMPHETAMINE-DEXTROAMPHETAMINE 30 MG PO TABS
30.0000 mg | ORAL_TABLET | Freq: Three times a day (TID) | ORAL | 0 refills | Status: DC
  Filled 2024-05-25 – 2024-08-03 (×3): qty 90, 30d supply, fill #0

## 2024-05-25 MED ORDER — DIAZEPAM 10 MG PO TABS
10.0000 mg | ORAL_TABLET | Freq: Every day | ORAL | 2 refills | Status: DC
Start: 2024-05-25 — End: 2024-08-25
  Filled 2024-05-25 – 2024-06-03 (×3): qty 30, 30d supply, fill #0
  Filled 2024-07-06: qty 30, 30d supply, fill #1
  Filled 2024-08-03: qty 30, 30d supply, fill #2
  Filled ????-??-??: fill #0

## 2024-05-25 MED ORDER — AMPHETAMINE-DEXTROAMPHETAMINE 30 MG PO TABS
30.0000 mg | ORAL_TABLET | Freq: Three times a day (TID) | ORAL | 0 refills | Status: DC
  Filled 2024-05-25 – 2024-06-03 (×3): qty 90, 30d supply, fill #0
  Filled ????-??-??: fill #0
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
## 2024-05-31 ENCOUNTER — Other Ambulatory Visit (HOSPITAL_BASED_OUTPATIENT_CLINIC_OR_DEPARTMENT_OTHER): Payer: Self-pay

## 2024-06-03 ENCOUNTER — Other Ambulatory Visit (HOSPITAL_BASED_OUTPATIENT_CLINIC_OR_DEPARTMENT_OTHER): Payer: Self-pay

## 2024-06-24 DIAGNOSIS — Z419 Encounter for procedure for purposes other than remedying health state, unspecified: Secondary | ICD-10-CM | POA: Diagnosis not present

## 2024-07-06 ENCOUNTER — Other Ambulatory Visit (HOSPITAL_BASED_OUTPATIENT_CLINIC_OR_DEPARTMENT_OTHER): Payer: Self-pay

## 2024-07-06 ENCOUNTER — Other Ambulatory Visit (HOSPITAL_COMMUNITY): Payer: Self-pay

## 2024-07-27 ENCOUNTER — Other Ambulatory Visit (HOSPITAL_BASED_OUTPATIENT_CLINIC_OR_DEPARTMENT_OTHER): Payer: Self-pay

## 2024-07-29 ENCOUNTER — Ambulatory Visit

## 2024-07-29 ENCOUNTER — Other Ambulatory Visit (HOSPITAL_BASED_OUTPATIENT_CLINIC_OR_DEPARTMENT_OTHER): Payer: Self-pay

## 2024-07-29 VITALS — BP 150/90 | HR 68 | Ht 69.0 in | Wt 202.1 lb

## 2024-07-29 DIAGNOSIS — I10 Essential (primary) hypertension: Secondary | ICD-10-CM

## 2024-07-29 DIAGNOSIS — G629 Polyneuropathy, unspecified: Secondary | ICD-10-CM | POA: Diagnosis not present

## 2024-07-29 DIAGNOSIS — Z23 Encounter for immunization: Secondary | ICD-10-CM

## 2024-07-29 MED ORDER — LOSARTAN POTASSIUM 100 MG PO TABS
100.0000 mg | ORAL_TABLET | Freq: Every day | ORAL | 1 refills | Status: AC
  Filled 2024-07-29: qty 90, 90d supply, fill #0

## 2024-07-29 MED ORDER — GABAPENTIN 100 MG PO CAPS
100.0000 mg | ORAL_CAPSULE | Freq: Two times a day (BID) | ORAL | 1 refills | Status: AC
  Filled 2024-07-29: qty 180, 90d supply, fill #0
  Filled 2024-10-16: qty 180, 90d supply, fill #1

## 2024-07-29 MED FILL — Amlodipine Besylate Tab 10 MG (Base Equivalent): 10.0000 mg | ORAL | 90 days supply | Qty: 90 | Fill #0 | Status: AC
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
## 2024-08-03 ENCOUNTER — Other Ambulatory Visit (HOSPITAL_BASED_OUTPATIENT_CLINIC_OR_DEPARTMENT_OTHER): Payer: Self-pay

## 2024-08-03 ENCOUNTER — Other Ambulatory Visit: Payer: Self-pay
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
## 2024-08-04 NOTE — Assessment & Plan Note (Signed)
 Blood pressure at 150/90 mmHg. Non-adherence to antihypertensive medication noted. Emphasized medication adherence to prevent stroke risk. - Send prescription refills for antihypertensive medication to pharmacy. - Encourage daily blood pressure monitoring and medication adherence. - Follow up in 3 months to reassess blood pressure control.

## 2024-08-24 DIAGNOSIS — Z419 Encounter for procedure for purposes other than remedying health state, unspecified: Secondary | ICD-10-CM | POA: Diagnosis not present

## 2024-08-25 ENCOUNTER — Other Ambulatory Visit (HOSPITAL_BASED_OUTPATIENT_CLINIC_OR_DEPARTMENT_OTHER): Payer: Self-pay

## 2024-08-25 ENCOUNTER — Telehealth (INDEPENDENT_AMBULATORY_CARE_PROVIDER_SITE_OTHER): Admitting: Psychiatry

## 2024-08-25 ENCOUNTER — Encounter (HOSPITAL_COMMUNITY): Payer: Self-pay | Admitting: Psychiatry

## 2024-08-25 DIAGNOSIS — F902 Attention-deficit hyperactivity disorder, combined type: Secondary | ICD-10-CM

## 2024-08-25 DIAGNOSIS — F5105 Insomnia due to other mental disorder: Secondary | ICD-10-CM

## 2024-08-25 MED ORDER — AMPHETAMINE-DEXTROAMPHETAMINE 30 MG PO TABS
30.0000 mg | ORAL_TABLET | Freq: Three times a day (TID) | ORAL | 0 refills | Status: AC
  Filled 2024-08-25 – 2024-10-26 (×2): qty 90, 30d supply, fill #0

## 2024-08-25 MED ORDER — AMPHETAMINE-DEXTROAMPHETAMINE 30 MG PO TABS
30.0000 mg | ORAL_TABLET | Freq: Three times a day (TID) | ORAL | 0 refills | Status: AC
  Filled 2024-08-25 – 2024-08-31 (×2): qty 90, 30d supply, fill #0

## 2024-08-25 MED ORDER — DIAZEPAM 10 MG PO TABS
10.0000 mg | ORAL_TABLET | Freq: Every day | ORAL | 2 refills | Status: AC
  Filled 2024-08-25 – 2024-08-31 (×2): qty 30, 30d supply, fill #0
  Filled 2024-09-28: qty 30, 30d supply, fill #1
  Filled 2024-10-26: qty 30, 30d supply, fill #2

## 2024-08-25 MED ORDER — AMPHETAMINE-DEXTROAMPHETAMINE 30 MG PO TABS
30.0000 mg | ORAL_TABLET | Freq: Three times a day (TID) | ORAL | 0 refills | Status: AC
  Filled 2024-08-25 – 2024-09-28 (×2): qty 90, 30d supply, fill #0
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
## 2024-08-26 ENCOUNTER — Other Ambulatory Visit (HOSPITAL_BASED_OUTPATIENT_CLINIC_OR_DEPARTMENT_OTHER): Payer: Self-pay

## 2024-08-31 ENCOUNTER — Other Ambulatory Visit (HOSPITAL_BASED_OUTPATIENT_CLINIC_OR_DEPARTMENT_OTHER): Payer: Self-pay

## 2024-09-23 ENCOUNTER — Other Ambulatory Visit (HOSPITAL_BASED_OUTPATIENT_CLINIC_OR_DEPARTMENT_OTHER): Payer: Self-pay

## 2024-09-26 ENCOUNTER — Other Ambulatory Visit (HOSPITAL_BASED_OUTPATIENT_CLINIC_OR_DEPARTMENT_OTHER): Payer: Self-pay

## 2024-09-28 ENCOUNTER — Other Ambulatory Visit (HOSPITAL_BASED_OUTPATIENT_CLINIC_OR_DEPARTMENT_OTHER): Payer: Self-pay

## 2024-10-17 ENCOUNTER — Other Ambulatory Visit (HOSPITAL_BASED_OUTPATIENT_CLINIC_OR_DEPARTMENT_OTHER): Payer: Self-pay

## 2024-10-19 ENCOUNTER — Other Ambulatory Visit (HOSPITAL_BASED_OUTPATIENT_CLINIC_OR_DEPARTMENT_OTHER): Payer: Self-pay

## 2024-10-26 ENCOUNTER — Other Ambulatory Visit (HOSPITAL_BASED_OUTPATIENT_CLINIC_OR_DEPARTMENT_OTHER): Payer: Self-pay

## 2024-10-27 ENCOUNTER — Other Ambulatory Visit: Payer: Self-pay

## 2024-10-27 DIAGNOSIS — Z122 Encounter for screening for malignant neoplasm of respiratory organs: Secondary | ICD-10-CM

## 2024-10-27 DIAGNOSIS — Z87891 Personal history of nicotine dependence: Secondary | ICD-10-CM

## 2024-10-27 DIAGNOSIS — F17210 Nicotine dependence, cigarettes, uncomplicated: Secondary | ICD-10-CM

## 2024-11-10 ENCOUNTER — Encounter: Payer: Self-pay | Admitting: Acute Care

## 2024-11-18 ENCOUNTER — Ambulatory Visit

## 2024-11-25 ENCOUNTER — Telehealth (HOSPITAL_COMMUNITY): Admitting: Psychiatry

## 2024-12-09 ENCOUNTER — Ambulatory Visit: Payer: Self-pay
